# Patient Record
Sex: Female | Born: 1970
Health system: Southern US, Community
[De-identification: ages and names within clinical notes are randomized; demographics above are authoritative.]

## PROBLEM LIST (undated history)

## (undated) DIAGNOSIS — R569 Unspecified convulsions: Secondary | ICD-10-CM

## (undated) DIAGNOSIS — I639 Cerebral infarction, unspecified: Secondary | ICD-10-CM

## (undated) DIAGNOSIS — I10 Essential (primary) hypertension: Secondary | ICD-10-CM

## (undated) DIAGNOSIS — D649 Anemia, unspecified: Secondary | ICD-10-CM

## (undated) HISTORY — DX: Anemia, unspecified: D64.9

## (undated) HISTORY — DX: Cerebral infarction, unspecified: I63.9

## (undated) HISTORY — PX: KNEE ARTHROSCOPY: SUR90

## (undated) HISTORY — PX: TUBAL LIGATION: SHX77

## (undated) HISTORY — DX: Unspecified convulsions: R56.9

## (undated) HISTORY — PX: OTHER SURGICAL HISTORY: SHX169

---

## 2009-08-05 ENCOUNTER — Emergency Department (HOSPITAL_COMMUNITY): Admission: EM | Admit: 2009-08-05 | Discharge: 2009-08-05 | Payer: Self-pay | Admitting: Family Medicine

## 2011-07-04 ENCOUNTER — Encounter: Payer: Self-pay | Admitting: *Deleted

## 2011-07-04 ENCOUNTER — Emergency Department (INDEPENDENT_AMBULATORY_CARE_PROVIDER_SITE_OTHER)
Admission: EM | Admit: 2011-07-04 | Discharge: 2011-07-04 | Disposition: A | Discharge status: Home / Self Care | Payer: Self-pay | Source: Home / Self Care | Attending: Family Medicine | Admitting: Family Medicine

## 2011-07-04 DIAGNOSIS — J441 Chronic obstructive pulmonary disease with (acute) exacerbation: Secondary | ICD-10-CM

## 2011-07-04 HISTORY — DX: Essential (primary) hypertension: I10

## 2011-07-04 MED ORDER — PREDNISONE 50 MG PO TABS: 50.0000 mg | ORAL_TABLET | Freq: Every day | ORAL | Status: DC

## 2011-07-04 MED ORDER — IPRATROPIUM-ALBUTEROL 18-103 MCG/ACT IN AERO: 2.0000 | INHALATION_SPRAY | Freq: Four times a day (QID) | RESPIRATORY_TRACT | Status: DC | PRN

## 2011-07-04 MED ORDER — DOXYCYCLINE HYCLATE 50 MG PO CAPS: 50.0000 mg | ORAL_CAPSULE | Freq: Two times a day (BID) | ORAL | Status: AC

## 2011-07-04 NOTE — ED Notes (Signed)
Pt  Has  Symptoms  Of  Cough  /  Congested  Body  Aches       With  Runny  Nose  /  Low  Grade  Fever  X  2  Days     -  Pt  Appears in no  Acute  Distress  Speaking in  Complete  sentances

## 2011-07-04 NOTE — ED Provider Notes (Signed)
40 year old woman with a past history significant for smoking and use of albuterol inhalers with no diagnosis of asthma who presents with several days of cough fever congestion and nasal discharge. She denies any dyspnea and has been using her children's albuterol inhaler.  She denies any vomiting and is taking ibuprofen for her fever which does work.  Her main complaint today is a productive cough.    PMH reviewed.   Social history significant for smoking ROS as above otherwise neg Medications reviewed. No current facility-administered medications for this encounter.   Current Outpatient Prescriptions  Medication Sig Dispense Refill  . dextromethorphan (DELSYM) 30 MG/5ML liquid Take 60 mg by mouth as needed.        . hydrochlorothiazide (HYDRODIURIL) 25 MG tablet Take 25 mg by mouth daily.        Marland Kitchen ibuprofen (ADVIL,MOTRIN) 200 MG tablet Take 400 mg by mouth every 6 (six) hours as needed.        . metFORMIN (GLUCOPHAGE) 500 MG tablet Take 500 mg by mouth 2 (two) times daily with a meal.        . albuterol-ipratropium (COMBIVENT) 18-103 MCG/ACT inhaler Inhale 2 puffs into the lungs every 6 (six) hours as needed for wheezing.  14.7 g  1  . doxycycline (VIBRAMYCIN) 50 MG capsule Take 1 capsule (50 mg total) by mouth 2 (two) times daily.  14 capsule  0  . predniSONE (DELTASONE) 50 MG tablet Take 1 tablet (50 mg total) by mouth daily.  5 tablet  0    Exam:  BP 184/80  Pulse 112  Temp(Src) 100.2 F (37.9 C) (Oral)  Resp 18  SpO2 97%  LMP 06/09/2011 Gen: Well NAD, obese HEENT: EOMI,  MMM Lungs: Normal work of breathing with prolonged expiratory phase and wheezes bilaterally. No dead space  or crackles Heart: RRR no MRG Abd: NABS, NT, ND Exts: Non edematous BL  LE, warm and well perfused.   Assessment and plan: 40 year old smoker with likely COPD exacerbation.  She has no diagnosis of COPD however she's been smoking for many years and has prolonged expiratory phase and some wheeze in  association with a productive cough.  Plan to treat with prednisone, Combivent inhaler and doxycycline. I recommended she follow up with her primary care provider for further workup.  I discussed red flag signs or symptoms such as dyspnea or vomiting the patient and she expresses understanding   Clementeen Graham 07/04/11 2022

## 2012-11-14 ENCOUNTER — Emergency Department (HOSPITAL_COMMUNITY)
Admission: EM | Admit: 2012-11-14 | Discharge: 2012-11-14 | Disposition: A | Discharge status: Home / Self Care | Payer: BC Managed Care – PPO | Source: Home / Self Care

## 2012-11-14 ENCOUNTER — Encounter (HOSPITAL_COMMUNITY): Payer: Self-pay | Admitting: Emergency Medicine

## 2012-11-14 DIAGNOSIS — J45909 Unspecified asthma, uncomplicated: Secondary | ICD-10-CM

## 2012-11-14 DIAGNOSIS — S239XXA Sprain of unspecified parts of thorax, initial encounter: Secondary | ICD-10-CM

## 2012-11-14 DIAGNOSIS — S29019A Strain of muscle and tendon of unspecified wall of thorax, initial encounter: Secondary | ICD-10-CM

## 2012-11-14 DIAGNOSIS — I10 Essential (primary) hypertension: Secondary | ICD-10-CM

## 2012-11-14 LAB — POCT URINALYSIS DIP (DEVICE)
Bilirubin Urine: NEGATIVE
Ketones, ur: NEGATIVE mg/dL
Leukocytes, UA: NEGATIVE

## 2012-11-14 LAB — POCT PREGNANCY, URINE: Preg Test, Ur: NEGATIVE

## 2012-11-14 MED ORDER — PREDNISONE 20 MG PO TABS: ORAL_TABLET | ORAL | Status: DC

## 2012-11-14 MED ORDER — TRAMADOL HCL 50 MG PO TABS: 50.0000 mg | ORAL_TABLET | Freq: Four times a day (QID) | ORAL | Status: DC | PRN

## 2012-11-14 MED ORDER — ALBUTEROL SULFATE HFA 108 (90 BASE) MCG/ACT IN AERS: 2.0000 | INHALATION_SPRAY | RESPIRATORY_TRACT | Status: DC | PRN

## 2012-11-14 NOTE — ED Provider Notes (Signed)
History     CSN: [id number]  Arrival date & time 11/14/12  1724   None     Chief Complaint  Patient presents with  . Back Pain    (Consider location/radiation/quality/duration/timing/severity/associated sxs/prior treatment) HPI Comments: 42 year old female presents with complaints of right parathoracic muscle pain after cleaning the house and lifting boxes. Pain is worse with movement. Better with rest. It is located in the right parathoracic musculature just inferior to the scapula. There is no spinal pain or radiation. She is also complaining of congestion for several weeks. She has a history of reactive airway disease and has had allergy symptoms. She has been using her albuterol HFA inhaler but only sparingly.   Past Medical History  Diagnosis Date  . Diabetes mellitus   . Hypertension     Past Surgical History  Procedure Laterality Date  . Btl      No family history on file.  History  Substance Use Topics  . Smoking status: Current Every Day Smoker  . Smokeless tobacco: Not on file  . Alcohol Use: Yes    OB History   Grav Para Term Preterm Abortions TAB SAB Ect Mult Living                  Review of Systems  Constitutional: Negative.   HENT: Negative.   Respiratory: Positive for cough, shortness of breath and wheezing.   Cardiovascular: Negative for chest pain, palpitations and leg swelling.  Gastrointestinal: Negative.   Genitourinary: Negative.   Musculoskeletal: Positive for myalgias.  Skin: Negative.   Neurological: Negative.     Allergies  Review of patient's allergies indicates no known allergies.  Home Medications   Current Outpatient Rx  Name  Route  Sig  Dispense  Refill  . albuterol (PROVENTIL HFA;VENTOLIN HFA) 108 (90 BASE) MCG/ACT inhaler   Inhalation   Inhale 2 puffs into the lungs every 4 (four) hours as needed for wheezing.   1 Inhaler   0   . EXPIRED: albuterol-ipratropium (COMBIVENT) 18-103 MCG/ACT inhaler   Inhalation  Inhale 2 puffs into the lungs every 6 (six) hours as needed for wheezing.   14.7 g   1   . dextromethorphan (DELSYM) 30 MG/5ML liquid   Oral   Take 60 mg by mouth as needed.           . hydrochlorothiazide (HYDRODIURIL) 25 MG tablet   Oral   Take 25 mg by mouth daily.           Marland Kitchen ibuprofen (ADVIL,MOTRIN) 200 MG tablet   Oral   Take 400 mg by mouth every 6 (six) hours as needed.           . metFORMIN (GLUCOPHAGE) 500 MG tablet   Oral   Take 500 mg by mouth 2 (two) times daily with a meal.           . predniSONE (DELTASONE) 20 MG tablet      2 tabs po once daily x 3 days, then 1 tab daily for 4 days   10 tablet   0   . predniSONE (DELTASONE) 50 MG tablet   Oral   Take 1 tablet (50 mg total) by mouth daily.   5 tablet   0   . traMADol (ULTRAM) 50 MG tablet   Oral   Take 1 tablet (50 mg total) by mouth every 6 (six) hours as needed for pain.   15 tablet   0     BP 211/96  Pulse 112  Temp(Src) 98.7 F (37.1 C) (Oral)  Resp 16  SpO2 97%  LMP 11/07/2012  Physical Exam  Nursing note and vitals reviewed. Constitutional: She is oriented to person, place, and time. She appears well-developed and well-nourished.  Neck: Normal range of motion. Neck supple.  Cardiovascular: Normal rate, regular rhythm and normal heart sounds.   Pulmonary/Chest: Effort normal. No respiratory distress. She has wheezes.   expiratory wheezes with mildly prolonged expiratory phase. good air movement   Musculoskeletal:  Tenderness in the right parathoracic musculature just inferior to the right scapula. Reproduced with movement of the torso such as flexion and rotation. No spinal tenderness.  Lymphadenopathy:    She has no cervical adenopathy.  Neurological: She is alert and oriented to person, place, and time. She exhibits normal muscle tone.  Skin: Skin is dry.  Psychiatric: She has a normal mood and affect.    ED Course  Procedures (including critical care time)  Labs  Reviewed  POCT URINALYSIS DIP (DEVICE) - Abnormal; Notable for the following:    Glucose, UA 500 (*)    Protein, ur 30 (*)    All other components within normal limits  POCT PREGNANCY, URINE   No results found.   1. Thoracic myofascial strain, initial encounter   2. RAD (reactive airway disease) with wheezing   3. HTN (hypertension)       MDM  Tramadol 50 mg Q6 hours when necessary back pain Prednisone 40 mg daily for 3 days then 20 mg daily for 4 days Stretches as demonstrated. Albuterol HFA 2 puffs every 4-6 hours when necessary cough and wheeze Apply heat to the area of muscle that is painful Limit lifting pulling bending and other movements that exacerbates pain. Contact your primary care doctor as soon as she possibly  to have hypertension and diabetes reassessed and managed.   Hayden Rasmussen, NP 11/14/12 [phone]

## 2012-11-14 NOTE — ED Notes (Signed)
Pt c/o back pain onset yest night Recalls moving and cleaning her house when she started to feel upper right back pain Pain radiates downward; hurts to cough and increases w/movement Denies: dysuria, hematuria, f/v/n/d  She is alert and oriented w/no signs of acute distress.

## 2012-12-17 ENCOUNTER — Other Ambulatory Visit: Payer: Self-pay | Admitting: Family Medicine

## 2012-12-17 DIAGNOSIS — Z1231 Encounter for screening mammogram for malignant neoplasm of breast: Secondary | ICD-10-CM

## 2013-01-17 ENCOUNTER — Ambulatory Visit
Admission: RE | Admit: 2013-01-17 | Discharge: 2013-01-17 | Disposition: A | Discharge status: Home / Self Care | Payer: BC Managed Care – PPO | Source: Ambulatory Visit | Attending: Family Medicine | Admitting: Family Medicine

## 2013-01-17 DIAGNOSIS — Z1231 Encounter for screening mammogram for malignant neoplasm of breast: Secondary | ICD-10-CM

## 2013-08-12 ENCOUNTER — Inpatient Hospital Stay (HOSPITAL_COMMUNITY): Payer: BC Managed Care – PPO

## 2013-08-12 ENCOUNTER — Emergency Department (HOSPITAL_COMMUNITY): Payer: BC Managed Care – PPO

## 2013-08-12 ENCOUNTER — Inpatient Hospital Stay (HOSPITAL_COMMUNITY)
Admission: EM | Admit: 2013-08-12 | Discharge: 2013-08-14 | DRG: 065 | Disposition: A | Discharge status: Home / Self Care | Payer: BC Managed Care – PPO | Attending: Internal Medicine | Admitting: Internal Medicine

## 2013-08-12 ENCOUNTER — Encounter (HOSPITAL_COMMUNITY): Payer: Self-pay | Admitting: Emergency Medicine

## 2013-08-12 DIAGNOSIS — Z9851 Tubal ligation status: Secondary | ICD-10-CM

## 2013-08-12 DIAGNOSIS — E876 Hypokalemia: Secondary | ICD-10-CM | POA: Diagnosis present

## 2013-08-12 DIAGNOSIS — I6350 Cerebral infarction due to unspecified occlusion or stenosis of unspecified cerebral artery: Secondary | ICD-10-CM

## 2013-08-12 DIAGNOSIS — E1169 Type 2 diabetes mellitus with other specified complication: Secondary | ICD-10-CM

## 2013-08-12 DIAGNOSIS — IMO0002 Reserved for concepts with insufficient information to code with codable children: Secondary | ICD-10-CM | POA: Diagnosis present

## 2013-08-12 DIAGNOSIS — R209 Unspecified disturbances of skin sensation: Secondary | ICD-10-CM | POA: Diagnosis present

## 2013-08-12 DIAGNOSIS — D649 Anemia, unspecified: Secondary | ICD-10-CM | POA: Diagnosis present

## 2013-08-12 DIAGNOSIS — E119 Type 2 diabetes mellitus without complications: Secondary | ICD-10-CM

## 2013-08-12 DIAGNOSIS — E11649 Type 2 diabetes mellitus with hypoglycemia without coma: Secondary | ICD-10-CM | POA: Diagnosis present

## 2013-08-12 DIAGNOSIS — E785 Hyperlipidemia, unspecified: Secondary | ICD-10-CM | POA: Diagnosis present

## 2013-08-12 DIAGNOSIS — I6330 Cerebral infarction due to thrombosis of unspecified cerebral artery: Principal | ICD-10-CM | POA: Diagnosis present

## 2013-08-12 DIAGNOSIS — Z91199 Patient's noncompliance with other medical treatment and regimen due to unspecified reason: Secondary | ICD-10-CM

## 2013-08-12 DIAGNOSIS — I10 Essential (primary) hypertension: Secondary | ICD-10-CM | POA: Diagnosis present

## 2013-08-12 DIAGNOSIS — Z9119 Patient's noncompliance with other medical treatment and regimen: Secondary | ICD-10-CM

## 2013-08-12 DIAGNOSIS — F17200 Nicotine dependence, unspecified, uncomplicated: Secondary | ICD-10-CM | POA: Diagnosis present

## 2013-08-12 DIAGNOSIS — I639 Cerebral infarction, unspecified: Secondary | ICD-10-CM | POA: Diagnosis present

## 2013-08-12 DIAGNOSIS — Z6841 Body Mass Index (BMI) 40.0 and over, adult: Secondary | ICD-10-CM

## 2013-08-12 DIAGNOSIS — E1165 Type 2 diabetes mellitus with hyperglycemia: Secondary | ICD-10-CM | POA: Diagnosis present

## 2013-08-12 LAB — PREGNANCY, URINE: Preg Test, Ur: NEGATIVE

## 2013-08-12 LAB — URINALYSIS, ROUTINE W REFLEX MICROSCOPIC
Bilirubin Urine: NEGATIVE
HGB URINE DIPSTICK: NEGATIVE
KETONES UR: NEGATIVE mg/dL
Leukocytes, UA: NEGATIVE
Nitrite: NEGATIVE
PROTEIN: NEGATIVE mg/dL
Specific Gravity, Urine: 1.025 (ref 1.005–1.030)
UROBILINOGEN UA: 0.2 mg/dL (ref 0.0–1.0)
pH: 7 (ref 5.0–8.0)

## 2013-08-12 LAB — BASIC METABOLIC PANEL
BUN: 6 mg/dL (ref 6–23)
CALCIUM: 8.9 mg/dL (ref 8.4–10.5)
CO2: 27 meq/L (ref 19–32)
CREATININE: 0.74 mg/dL (ref 0.50–1.10)
Chloride: 96 mEq/L (ref 96–112)
GFR calc Af Amer: >90 mL/min (ref >90)
GLUCOSE: 270 mg/dL — AB (ref 70–99)
Potassium: 3.2 mEq/L — ABNORMAL LOW (ref 3.7–5.3)
Sodium: 134 mEq/L — ABNORMAL LOW (ref 137–147)

## 2013-08-12 LAB — CBC WITH DIFFERENTIAL/PLATELET
BASOS ABS: 0 10*3/uL (ref 0.0–0.1)
Basophils Relative: 0 % (ref 0–1)
EOS ABS: 0.1 10*3/uL (ref 0.0–0.7)
EOS PCT: 1 % (ref 0–5)
HEMATOCRIT: 32 % — AB (ref 36.0–46.0)
Hemoglobin: 10.2 g/dL — ABNORMAL LOW (ref 12.0–15.0)
LYMPHS ABS: 1.9 10*3/uL (ref 0.7–4.0)
LYMPHS PCT: 26 % (ref 12–46)
MCH: 23.4 pg — AB (ref 26.0–34.0)
MCHC: 31.9 g/dL (ref 30.0–36.0)
MCV: 73.6 fL — AB (ref 78.0–100.0)
MONO ABS: 0.4 10*3/uL (ref 0.1–1.0)
Monocytes Relative: 5 % (ref 3–12)
Neutro Abs: 5.2 10*3/uL (ref 1.7–7.7)
Neutrophils Relative %: 68 % (ref 43–77)
Platelets: 259 10*3/uL (ref 150–400)
RBC: 4.35 MIL/uL (ref 3.87–5.11)
RDW: 17 % — AB (ref 11.5–15.5)
WBC: 7.6 10*3/uL (ref 4.0–10.5)

## 2013-08-12 LAB — URINE MICROSCOPIC-ADD ON

## 2013-08-12 LAB — GLUCOSE, CAPILLARY: Glucose-Capillary: 142 mg/dL — ABNORMAL HIGH (ref 70–99)

## 2013-08-12 LAB — TROPONIN I: Troponin I: <0.3 ng/mL (ref <0.30)

## 2013-08-12 MED ORDER — ENOXAPARIN SODIUM 40 MG/0.4ML ~~LOC~~ SOLN
40.0000 mg | SUBCUTANEOUS | Status: DC
  Filled 2013-08-12 (×2): qty 0.4

## 2013-08-12 MED ORDER — ASPIRIN 325 MG PO TABS
325.0000 mg | ORAL_TABLET | Freq: Every day | ORAL | Status: DC
  Administered 2013-08-13 – 2013-08-14 (×2): 325 mg via ORAL
  Filled 2013-08-12 (×2): qty 1

## 2013-08-12 MED ORDER — HYDROCHLOROTHIAZIDE 12.5 MG PO CAPS
25.0000 mg | ORAL_CAPSULE | Freq: Once | ORAL | Status: AC
  Administered 2013-08-12: 25 mg via ORAL
  Filled 2013-08-12: qty 2

## 2013-08-12 MED ORDER — SENNOSIDES-DOCUSATE SODIUM 8.6-50 MG PO TABS: 1.0000 | ORAL_TABLET | Freq: Every evening | ORAL | Status: DC | PRN

## 2013-08-12 MED ORDER — SODIUM CHLORIDE 0.9 % IV SOLN
INTRAVENOUS | Status: DC
  Administered 2013-08-13: 02:00:00 via INTRAVENOUS

## 2013-08-12 MED ORDER — INSULIN ASPART 100 UNIT/ML ~~LOC~~ SOLN
0.0000 [IU] | Freq: Three times a day (TID) | SUBCUTANEOUS | Status: DC
  Administered 2013-08-13: 3 [IU] via SUBCUTANEOUS
  Administered 2013-08-13 (×2): 5 [IU] via SUBCUTANEOUS
  Administered 2013-08-14 (×2): 3 [IU] via SUBCUTANEOUS

## 2013-08-12 MED ORDER — ASPIRIN 300 MG RE SUPP
300.0000 mg | Freq: Every day | RECTAL | Status: DC
  Filled 2013-08-12 (×2): qty 1

## 2013-08-12 MED ORDER — LORAZEPAM 2 MG/ML IJ SOLN
0.5000 mg | Freq: Once | INTRAMUSCULAR | Status: AC
  Administered 2013-08-12: 0.5 mg via INTRAVENOUS
  Filled 2013-08-12: qty 1

## 2013-08-12 MED ORDER — HYDRALAZINE HCL 20 MG/ML IJ SOLN
10.0000 mg | INTRAMUSCULAR | Status: DC | PRN
  Administered 2013-08-13: 10 mg via INTRAVENOUS
  Filled 2013-08-12: qty 1

## 2013-08-12 MED ORDER — POTASSIUM CHLORIDE CRYS ER 20 MEQ PO TBCR
20.0000 meq | EXTENDED_RELEASE_TABLET | Freq: Once | ORAL | Status: AC
  Administered 2013-08-13: 20 meq via ORAL
  Filled 2013-08-12: qty 1

## 2013-08-12 MED ORDER — POTASSIUM CHLORIDE CRYS ER 20 MEQ PO TBCR
40.0000 meq | EXTENDED_RELEASE_TABLET | Freq: Once | ORAL | Status: AC
  Administered 2013-08-12: 40 meq via ORAL
  Filled 2013-08-12: qty 2

## 2013-08-12 MED ORDER — AMLODIPINE BESYLATE 5 MG PO TABS
5.0000 mg | ORAL_TABLET | Freq: Every day | ORAL | Status: DC
  Administered 2013-08-13: 5 mg via ORAL
  Filled 2013-08-12: qty 1

## 2013-08-12 MED ORDER — AMLODIPINE BESYLATE 5 MG PO TABS
5.0000 mg | ORAL_TABLET | Freq: Once | ORAL | Status: AC
  Administered 2013-08-12: 5 mg via ORAL
  Filled 2013-08-12: qty 1

## 2013-08-12 MED ORDER — ALBUTEROL SULFATE (2.5 MG/3ML) 0.083% IN NEBU: 3.0000 mL | INHALATION_SOLUTION | RESPIRATORY_TRACT | Status: DC | PRN

## 2013-08-12 NOTE — ED Notes (Addendum)
[facility] at bedside 

## 2013-08-12 NOTE — ED Notes (Signed)
Pt ambulating to restroom with steady gait

## 2013-08-12 NOTE — ED Notes (Addendum)
Pt reports symptoms began yesterday morning.

## 2013-08-12 NOTE — H&P (Addendum)
[facility] History and Physical  Cierria Haney-McDonald GNF:[id number] DOB: January 27, 1971 DOA: 08/12/2013  Referring physician: ER physician. PCP: Pcp Not In System   Chief Complaint: Right upper extremity and lower extremity numbness.  HPI: Neesha Haney-McDonald is a 43 y.o. female history of hypertension, diabetes mellitus and ongoing tobacco abuse presents to the ER because of persistent numbness of the upper and lower extremities. Patient has been having these symptoms since yesterday morning. Denies any weakness of the extremities. Denies any headache visual symptoms difficulty speaking swallowing. In the ER patient MRI brain which showed acute left thalamic infarct. On-call neurologist Dr. Roseanne RenoStewart has advised patient to be transferred [facility] for further stroke workup. Patient is in agreement with transfer. Patient denies any chest pain shortness of breath palpitations nausea vomiting abdominal pain diarrhea dizziness.   Review of Systems: As presented in the history of presenting illness, rest negative.  Past Medical History  Diagnosis Date  . Diabetes mellitus   . Hypertension    Past Surgical History  Procedure Laterality Date  . Btl    . Cesarean section    . Knee arthroscopy    . Tubal ligation     Social History:  reports that she has been smoking.  She has never used smokeless tobacco. She reports that she does not drink alcohol or use illicit drugs. Where does patient live home. Can patient participate in ADLs? Yes.  No Known Allergies  Family History:  Family History  Problem Relation Age of Onset  . Cancer Father       Prior to Admission medications   Medication Sig Start Date End Date Taking? Authorizing Provider  albuterol (PROVENTIL HFA;VENTOLIN HFA) 108 (90 BASE) MCG/ACT inhaler Inhale 2 puffs into the lungs every 4 (four) hours as needed for wheezing. 11/14/12  Yes Hayden Rasmussenavid Mabe, NP  amLODipine (NORVASC) 5 MG tablet Take 5 mg by mouth daily.   Yes  Historical Provider, MD  hydrochlorothiazide (HYDRODIURIL) 25 MG tablet Take 25 mg by mouth daily.   Yes Historical Provider, MD  ibuprofen (ADVIL,MOTRIN) 200 MG tablet Take 600 mg by mouth every 6 (six) hours as needed for cramping.    Yes Historical Provider, MD  metFORMIN (GLUCOPHAGE) 1000 MG tablet Take 1,000 mg by mouth 2 (two) times daily with a meal.   Yes Historical Provider, MD  PRESCRIPTION MEDICATION Take 1 tablet by mouth daily. Sample product from the doctor's office: Iron with probiotic.   Yes Historical Provider, MD    Physical Exam: Filed Vitals:   08/12/13 1940 08/12/13 2000 08/12/13 2015 08/12/13 2030  BP:  166/105  189/96  Pulse: 94 99 95   Temp:      TempSrc:      Resp: 19 19 19 29   SpO2: 99% 97% 99%      General:  Well-developed and nourished.  Eyes: Anicteric no pallor.  ENT: No discharge from the ears eyes nose mouth.  Neck: No mass felt.  Cardiovascular: S1-S2 heard.  Respiratory: No rhonchi or crepitations.  Abdomen: Soft nontender bowel sounds present.  Skin: No rash.  Musculoskeletal: No edema.  Psychiatric: Appears normal.  Neurologic: Alert awake oriented to time place and person. Moves all extremities 5 x 5. No facial asymmetry. No tongue deviation. PERRLA posterior.  Labs on Admission:  Basic Metabolic Panel:  Recent Labs Lab 08/12/13 1300  NA 134*  K 3.2*  CL 96  CO2 27  GLUCOSE 270*  BUN 6  CREATININE 0.74  CALCIUM 8.9  Liver Function Tests: No results found for this basename: AST, ALT, ALKPHOS, BILITOT, PROT, ALBUMIN,  in the last 168 hours No results found for this basename: LIPASE, AMYLASE,  in the last 168 hours No results found for this basename: AMMONIA,  in the last 168 hours CBC:  Recent Labs Lab 08/12/13 1300  WBC 7.6  NEUTROABS 5.2  HGB 10.2*  HCT 32.0*  MCV 73.6*  PLT 259   Cardiac Enzymes:  Recent Labs Lab 08/12/13 1300  TROPONINI <0.30    BNP (last 3 results) No results found for this  basename: PROBNP,  in the last 8760 hours CBG: No results found for this basename: GLUCAP,  in the last 168 hours  Radiological Exams on Admission: Ct Head Wo Contrast  08/12/2013   CLINICAL DATA:  Numbness  EXAM: CT HEAD WITHOUT CONTRAST  TECHNIQUE: Contiguous axial images were obtained from the base of the skull through the vertex without intravenous contrast.  COMPARISON:  None.  FINDINGS: 3.6 x 2.7 cm low-density lesion along the left frontal lobe (series 2/ image 10). Associated central calcification (series 2/image 11). Exact location of the lesion (intra-axial versus extra-axial) is unclear.  Primary differential considerations include chronic infarct (if intra-axial) or possibly an arachnoid cyst (if extra-axial). Acute/subacute infarct or solid mass are considered less likely.  No evidence of parenchymal hemorrhage or extra-axial fluid collection.  No midline shift.  Basal cisterns are patent.  Cerebral volume is within normal limits. No ventriculomegaly.  The visualized paranasal sinuses are essentially clear. The mastoid air cells are unopacified.  No evidence of calvarial fracture.  IMPRESSION: 3.6 cm low-density lesion along the left frontal lobe, as described above. Primary differential considerations include chronic infarct or arachnoid cyst. MRI brain with/without contrast is suggested for further evaluation.  These results were called by telephone at the time of interpretation on 08/12/2013 at 1:31 PM to [facility] , who verbally acknowledged these results.   Electronically Signed   By: Charline Bills M.D.   On: 08/12/2013 13:31   Mr Maxine Glenn Head Wo Contrast  08/12/2013   CLINICAL DATA:  Stroke.  Numbness.  Abnormal CT  EXAM: MRI HEAD WITHOUT CONTRAST  MRA HEAD WITHOUT CONTRAST  TECHNIQUE: Multiplanar, multiecho pulse sequences of the brain and surrounding structures were obtained without intravenous contrast. Angiographic images of the head were obtained using MRA technique without  contrast.  COMPARISON:  CT 08/12/2013  FINDINGS: MRI HEAD FINDINGS  Acute infarct left thalamus measuring approximately 13 mm in diameter. No other areas of acute infarct.  Encephalomalacia left frontal temporal lobe compatible with chronic infarct, as noted on the CT.  Chronic microvascular ischemic change throughout the white matter. Hyperintensity right lateral cerebellum most likely is an area of chronic infarct. This area does not show any restricted diffusion.  MRA HEAD FINDINGS  Atherosclerotic disease in both vertebral arteries which are patent to the basilar. The basilar is patent with mild atherosclerotic disease. Superior cerebellar and posterior cerebral arteries are patent with moderate atherosclerotic disease diffusely.  Cavernous carotid is irregular with mild to moderate stenosis bilaterally. Both anterior cerebral arteries are patent. There is a moderate to severe stenosis in the left A1 segment.  The right middle cerebral artery is patent with mild atherosclerotic disease. Mild atherosclerotic disease in the left middle cerebral artery.  Negative for cerebral aneurysm.  IMPRESSION: Acute infarct left thalamus  Chronic infarct left frontal temporal lobe and throughout the cerebral white matter. Hyperintensity right lateral cerebellum appears to represent a chronic  infarct.  Diffuse intracranial atherosclerotic disease without large vessel occlusion.  I discussed the findings by telephone with Polly Cobia PA   Electronically Signed   By: Marlan Palau M.D.   On: 08/12/2013 19:02   Mr Brain Wo Contrast  08/12/2013   CLINICAL DATA:  Stroke.  Numbness.  Abnormal CT  EXAM: MRI HEAD WITHOUT CONTRAST  MRA HEAD WITHOUT CONTRAST  TECHNIQUE: Multiplanar, multiecho pulse sequences of the brain and surrounding structures were obtained without intravenous contrast. Angiographic images of the head were obtained using MRA technique without contrast.  COMPARISON:  CT 08/12/2013  FINDINGS: MRI HEAD FINDINGS   Acute infarct left thalamus measuring approximately 13 mm in diameter. No other areas of acute infarct.  Encephalomalacia left frontal temporal lobe compatible with chronic infarct, as noted on the CT.  Chronic microvascular ischemic change throughout the white matter. Hyperintensity right lateral cerebellum most likely is an area of chronic infarct. This area does not show any restricted diffusion.  MRA HEAD FINDINGS  Atherosclerotic disease in both vertebral arteries which are patent to the basilar. The basilar is patent with mild atherosclerotic disease. Superior cerebellar and posterior cerebral arteries are patent with moderate atherosclerotic disease diffusely.  Cavernous carotid is irregular with mild to moderate stenosis bilaterally. Both anterior cerebral arteries are patent. There is a moderate to severe stenosis in the left A1 segment.  The right middle cerebral artery is patent with mild atherosclerotic disease. Mild atherosclerotic disease in the left middle cerebral artery.  Negative for cerebral aneurysm.  IMPRESSION: Acute infarct left thalamus  Chronic infarct left frontal temporal lobe and throughout the cerebral white matter. Hyperintensity right lateral cerebellum appears to represent a chronic infarct.  Diffuse intracranial atherosclerotic disease without large vessel occlusion.  I discussed the findings by telephone with Polly Cobia PA   Electronically Signed   By: Marlan Palau M.D.   On: 08/12/2013 19:02    EKG: Independently reviewed. Normal sinus rhythm.  Assessment/Plan Principal Problem:   CVA (cerebral infarction) Active Problems:   HTN (hypertension)   Diabetes mellitus   1. Acute left thalamic infarct - patient has been placed on neurochecks, swallow evaluation. I have ordered 2-D echo carotid Dopplers hemoglobin A1c lipid panel and physical therapy consult. Patient will be monitored on telemetry. Patient to be seen by neurologist on arrival at [facility].  Patient is agreeable to transfer to [facility] for further workup. Aspirin. 2. Diabetes mellitus type 2 uncontrolled - closely follow CBGs with sliding-scale coverage. Check hemoglobin A1c. 3. Hypertension uncontrolled - at this time due to acute stroke we are allowing permissive hypertension. I have placed patient on when necessary IV hydralazine for systolic blood pressure more than 220 and diastolic more 120. Closely follow blood pressure trends. I'm holding off patient's hydrochlorothiazide for now and gently hydrating because of stroke. 4. Anemia - check anemia panel. Closely follow CBC. 5. Tobacco abuse - patient advised to quit smoking. 6. Mild hypokalemia - replace and recheck.  Patient is being transferred to [facility] for further stroke workup. Patient is agreeable to transfer. Accepting physician is Dr. Allena Katz.  Code Status: Full code.  Family Communication: None.  Disposition Plan: Admit to inpatient.    Javoris Star N. [facility] Pager [phone].  If 7PM-7AM, please contact night-coverage [email or web address] Password [facility] 08/12/2013, 8:49 PM

## 2013-08-12 NOTE — ED Provider Notes (Signed)
Complains of numbness at right arm and right leg onset upon awakening yesterday morning. She does admit to noncompliance with blood pressure medications No difficulty with strength or coordination no difficulty with speaking numbness is constant. On exam patient is alert Glasgow Coma Score HEENT exam no facial asymmetry neck supple no bruit lungs clear auscultation heart regular rate and rhythm abdomen morbidly obese nontender all 4 extremities no redness or tenderness neurovascularly intact neurologic Glasgow Coma Score 15 gait normal Romberg normal pronator drift normal cranial gross 12 grossly intact finger to nose normal motor strength 5 over 5 overall  Doug SouSam Zaccary Creech, MD 08/12/13 1612

## 2013-08-12 NOTE — ED Notes (Signed)
MD [place] at bedside. 

## 2013-08-12 NOTE — ED Notes (Signed)
[facility] called ETA "at least one hour".

## 2013-08-12 NOTE — ED Notes (Signed)
Bed: WA01 Expected date:  Expected time:  Means of arrival:  Comments: ems- 43 yo F, right arm and leg numbness from neck down x1 day

## 2013-08-12 NOTE — ED Notes (Signed)
Per EMS, pt went to MD today c/o rt neck arm and leg numbness.  MD sent pt emergent to ED with increased b/p and c/o numbness.  Pt hx DM, HTN, Vitals:  100, 190/112, resp 16,  cbg 278.  sats 99%

## 2013-08-12 NOTE — ED Notes (Signed)
Patient talking on cell phone

## 2013-08-12 NOTE — ED Notes (Signed)
Pt in MRI when this RN was rounding will check back.

## 2013-08-12 NOTE — ED Provider Notes (Signed)
Medications  amLODipine (NORVASC) tablet 5 mg (5 mg Oral Given 08/12/13 1341)  hydrochlorothiazide (MICROZIDE) capsule 25 mg (25 mg Oral Given 08/12/13 1341)  LORazepam (ATIVAN) injection 0.5 mg (0.5 mg Intravenous Given 08/12/13 1509)   Results for orders placed during the hospital encounter of 08/12/13  CBC WITH DIFFERENTIAL      Result Value Range   WBC 7.6  4.0 - 10.5 K/uL   RBC 4.35  3.87 - 5.11 MIL/uL   Hemoglobin 10.2 (*) 12.0 - 15.0 g/dL   HCT 16.1 (*) 09.6 - 04.5 %   MCV 73.6 (*) 78.0 - 100.0 fL   MCH 23.4 (*) 26.0 - 34.0 pg   MCHC 31.9  30.0 - 36.0 g/dL   RDW 40.9 (*) 81.1 - 91.4 %   Platelets 259  150 - 400 K/uL   Neutrophils Relative % 68  43 - 77 %   Neutro Abs 5.2  1.7 - 7.7 K/uL   Lymphocytes Relative 26  12 - 46 %   Lymphs Abs 1.9  0.7 - 4.0 K/uL   Monocytes Relative 5  3 - 12 %   Monocytes Absolute 0.4  0.1 - 1.0 K/uL   Eosinophils Relative 1  0 - 5 %   Eosinophils Absolute 0.1  0.0 - 0.7 K/uL   Basophils Relative 0  0 - 1 %   Basophils Absolute 0.0  0.0 - 0.1 K/uL  BASIC METABOLIC PANEL      Result Value Range   Sodium 134 (*) 137 - 147 mEq/L   Potassium 3.2 (*) 3.7 - 5.3 mEq/L   Chloride 96  96 - 112 mEq/L   CO2 27  19 - 32 mEq/L   Glucose, Bld 270 (*) 70 - 99 mg/dL   BUN 6  6 - 23 mg/dL   Creatinine, Ser 7.82  0.50 - 1.10 mg/dL   Calcium 8.9  8.4 - 95.6 mg/dL   GFR calc non Af Amer >90  >90 mL/min   GFR calc Af Amer >90  >90 mL/min  URINALYSIS, ROUTINE W REFLEX MICROSCOPIC      Result Value Range   Color, Urine YELLOW  YELLOW   APPearance CLEAR  CLEAR   Specific Gravity, Urine 1.025  1.005 - 1.030   pH 7.0  5.0 - 8.0   Glucose, UA >1000 (*) NEGATIVE mg/dL   Hgb urine dipstick NEGATIVE  NEGATIVE   Bilirubin Urine NEGATIVE  NEGATIVE   Ketones, ur NEGATIVE  NEGATIVE mg/dL   Protein, ur NEGATIVE  NEGATIVE mg/dL   Urobilinogen, UA 0.2  0.0 - 1.0 mg/dL   Nitrite NEGATIVE  NEGATIVE   Leukocytes, UA NEGATIVE  NEGATIVE  TROPONIN I      Result Value Range    Troponin I <0.30  <0.30 ng/mL  PREGNANCY, URINE      Result Value Range   Preg Test, Ur NEGATIVE  NEGATIVE  URINE MICROSCOPIC-ADD ON      Result Value Range   Squamous Epithelial / LPF FEW (*) RARE   WBC, UA 0-2  <3 WBC/hpf   Urine-Other MUCOUS PRESENT     Ct Head Wo Contrast  08/12/2013   CLINICAL DATA:  Numbness  EXAM: CT HEAD WITHOUT CONTRAST  TECHNIQUE: Contiguous axial images were obtained from the base of the skull through the vertex without intravenous contrast.  COMPARISON:  None.  FINDINGS: 3.6 x 2.7 cm low-density lesion along the left frontal lobe (series 2/ image 10). Associated central calcification (series 2/image 11). Exact location  of the lesion (intra-axial versus extra-axial) is unclear.  Primary differential considerations include chronic infarct (if intra-axial) or possibly an arachnoid cyst (if extra-axial). Acute/subacute infarct or solid mass are considered less likely.  No evidence of parenchymal hemorrhage or extra-axial fluid collection.  No midline shift.  Basal cisterns are patent.  Cerebral volume is within normal limits. No ventriculomegaly.  The visualized paranasal sinuses are essentially clear. The mastoid air cells are unopacified.  No evidence of calvarial fracture.  IMPRESSION: 3.6 cm low-density lesion along the left frontal lobe, as described above. Primary differential considerations include chronic infarct or arachnoid cyst. MRI brain with/without contrast is suggested for further evaluation.  These results were called by telephone at the time of interpretation on 08/12/2013 at 1:31 PM to [facility]ESS[facility] PALMER , who verbally acknowledged these results.   Electronically Signed   By: Charline BillsSriyesh  Krishnan M.D.   On: 08/12/2013 13:31    [place] results. MR results called with acute left thalamus infarct. Neurohospitalist consulted and [facility]. Patient is out of the window of time for  therapeutic thrombolytics. Reviewed results with patient. Patient continues with right arm numbness on re-assessment. The patient appears reasonably stabilized for admission considering the current resources, flow, and capabilities available in the ED at this time, and I doubt any other [facility] further screening and/or treatment in the ED prior to admission.Patient d/w with Dr. Criss AlvineGoldston, agrees with plan.     Jeannetta EllisJennifer L Lella Mullany, PA-C 08/12/13 2331

## 2013-08-12 NOTE — Consult Note (Signed)
Referring Physician: Dr. Rennis ChrisJacobowitz    Chief Complaint: New-onset left-sided numbness.  HPI: Michele [name] is an 43 y.o. female with a history of hypertension and diabetes mellitus who woke up with numbness and tingling involving her left side on the morning of 08/11/2013. She was last known well at about 12:30 AM earlier that morning when she went to bed. She has no documented history of previous stroke or TIA. She has not been on antiplatelet therapy. CT scan of the head showed acute left thalamic infarction, as well as an old left frontotemporal infarction as well as possible old right cerebellar stroke. NIH stroke score was 1.  LSN: 30 a.m. on 08/11/2013 tPA Given: No: Beyond time under for treatment consideration MRankin: 0  Past Medical History  Diagnosis Date  . Diabetes mellitus   . Hypertension     Family History  Problem Relation Age of Onset  . Cancer Father      Medications: I have reviewed the patient's current medications.  ROS: History obtained from the patient  General ROS: negative for - chills, fatigue, fever, night sweats, weight gain or weight loss Psychological ROS: negative for - behavioral disorder, hallucinations, memory difficulties, mood swings or suicidal ideation Ophthalmic ROS: negative for - blurry vision, double vision, eye pain or loss of vision ENT ROS: negative for - epistaxis, nasal discharge, oral lesions, sore throat, tinnitus or vertigo Allergy and Immunology ROS: negative for - hives or itchy/watery eyes Hematological and Lymphatic ROS: negative for - bleeding problems, bruising or swollen lymph nodes Endocrine ROS: negative for - galactorrhea, hair pattern changes, polydipsia/polyuria or temperature intolerance Respiratory ROS: negative for - cough, hemoptysis, shortness of breath or wheezing Cardiovascular ROS: negative for - chest pain, dyspnea on exertion, edema or irregular heartbeat Gastrointestinal ROS: negative for -  abdominal pain, diarrhea, hematemesis, nausea/vomiting or stool incontinence Genito-Urinary ROS: negative for - dysuria, hematuria, incontinence or urinary frequency/urgency Musculoskeletal ROS: negative for - joint swelling or muscular weakness Neurological ROS: as noted in HPI Dermatological ROS: negative for rash and skin lesion changes  Physical Examination: Blood pressure 203/103, pulse 95, temperature 98.2 F (36.8 C), temperature source Oral, resp. rate 12, last menstrual period 07/22/2013, SpO2 99.00%.  Neurologic Examination: Mental Status: Alert, oriented, thought content appropriate.  Speech fluent without evidence of aphasia. Able to follow commands without difficulty. Cranial Nerves: II-Visual fields were normal. III/IV/VI-Pupils were equal and reacted. Extraocular movements were full and conjugate.    V/VII-slight right facial numbness; no facial weakness. VIII-normal. X-normal speech and symmetrical palatal movement. Motor: 5/5 bilaterally with normal tone and bulk Sensory: Slightly reduced perception of tactile sensation with paresthesias involving right upper extremity; normal sensory findings otherwise.Marland Kitchen. Deep Tendon Reflexes: 1+ and symmetric in upper extremities and absent at knees and ankles. Plantars: Flexor bilaterally Cerebellar: Normal finger-to-nose testing. Carotid auscultation: Normal  Ct Head Wo Contrast  08/12/2013   CLINICAL DATA:  Numbness  EXAM: CT HEAD WITHOUT CONTRAST  TECHNIQUE: Contiguous axial images were obtained from the base of the skull through the vertex without intravenous contrast.  COMPARISON:  None.  FINDINGS: 3.6 x 2.7 cm low-density lesion along the left frontal lobe (series 2/ image 10). Associated central calcification (series 2/image 11). Exact location of the lesion (intra-axial versus extra-axial) is unclear.  Primary differential considerations include chronic infarct (if intra-axial) or possibly an arachnoid cyst (if extra-axial).  Acute/subacute infarct or solid mass are considered less likely.  No evidence of parenchymal hemorrhage or extra-axial fluid collection.  No midline shift.  Basal cisterns are patent.  Cerebral volume is within normal limits. No ventriculomegaly.  The visualized paranasal sinuses are essentially clear. The mastoid air cells are unopacified.  No evidence of calvarial fracture.  IMPRESSION: 3.6 cm low-density lesion along the left frontal lobe, as described above. Primary differential considerations include chronic infarct or arachnoid cyst. MRI brain with/without contrast is suggested for further evaluation.  These results were called by telephone at the time of interpretation on 08/12/2013 at 1:31 PM to [facility] , who verbally acknowledged these results.   Electronically Signed   By: Charline Bills M.D.   On: 08/12/2013 13:31   Mr Maxine Glenn Head Wo Contrast  08/12/2013   CLINICAL DATA:  Stroke.  Numbness.  Abnormal CT  EXAM: MRI HEAD WITHOUT CONTRAST  MRA HEAD WITHOUT CONTRAST  TECHNIQUE: Multiplanar, multiecho pulse sequences of the brain and surrounding structures were obtained without intravenous contrast. Angiographic images of the head were obtained using MRA technique without contrast.  COMPARISON:  CT 08/12/2013  FINDINGS: MRI HEAD FINDINGS  Acute infarct left thalamus measuring approximately 13 mm in diameter. No other areas of acute infarct.  Encephalomalacia left frontal temporal lobe compatible with chronic infarct, as noted on the CT.  Chronic microvascular ischemic change throughout the white matter. Hyperintensity right lateral cerebellum most likely is an area of chronic infarct. This area does not show any restricted diffusion.  MRA HEAD FINDINGS  Atherosclerotic disease in both vertebral arteries which are patent to the basilar. The basilar is patent with mild atherosclerotic disease. Superior cerebellar and posterior cerebral arteries are patent with moderate atherosclerotic disease diffusely.   Cavernous carotid is irregular with mild to moderate stenosis bilaterally. Both anterior cerebral arteries are patent. There is a moderate to severe stenosis in the left A1 segment.  The right middle cerebral artery is patent with mild atherosclerotic disease. Mild atherosclerotic disease in the left middle cerebral artery.  Negative for cerebral aneurysm.  IMPRESSION: Acute infarct left thalamus  Chronic infarct left frontal temporal lobe and throughout the cerebral white matter. Hyperintensity right lateral cerebellum appears to represent a chronic infarct.  Diffuse intracranial atherosclerotic disease without large vessel occlusion.  I discussed the findings by telephone with Polly Cobia PA   Electronically Signed   By: Marlan Palau M.D.   On: 08/12/2013 19:02   Mr Brain Wo Contrast  08/12/2013   CLINICAL DATA:  Stroke.  Numbness.  Abnormal CT  EXAM: MRI HEAD WITHOUT CONTRAST  MRA HEAD WITHOUT CONTRAST  TECHNIQUE: Multiplanar, multiecho pulse sequences of the brain and surrounding structures were obtained without intravenous contrast. Angiographic images of the head were obtained using MRA technique without contrast.  COMPARISON:  CT 08/12/2013  FINDINGS: MRI HEAD FINDINGS  Acute infarct left thalamus measuring approximately 13 mm in diameter. No other areas of acute infarct.  Encephalomalacia left frontal temporal lobe compatible with chronic infarct, as noted on the CT.  Chronic microvascular ischemic change throughout the white matter. Hyperintensity right lateral cerebellum most likely is an area of chronic infarct. This area does not show any restricted diffusion.  MRA HEAD FINDINGS  Atherosclerotic disease in both vertebral arteries which are patent to the basilar. The basilar is patent with mild atherosclerotic disease. Superior cerebellar and posterior cerebral arteries are patent with moderate atherosclerotic disease diffusely.  Cavernous carotid is irregular with mild to moderate stenosis  bilaterally. Both anterior cerebral arteries are patent. There is a moderate to severe stenosis in the left A1 segment.  The right middle cerebral  artery is patent with mild atherosclerotic disease. Mild atherosclerotic disease in the left middle cerebral artery.  Negative for cerebral aneurysm.  IMPRESSION: Acute infarct left thalamus  Chronic infarct left frontal temporal lobe and throughout the cerebral white matter. Hyperintensity right lateral cerebellum appears to represent a chronic infarct.  Diffuse intracranial atherosclerotic disease without large vessel occlusion.  I discussed the findings by telephone with Polly Cobia PA   Electronically Signed   By: Marlan Palau M.D.   On: 08/12/2013 19:02    Assessment: 43 y.o. female with significant risk factors for stroke presenting with acute left thalamic ischemic infarction.  Stroke Risk Factors - diabetes mellitus, family history and hypertension  Plan: 1. HgbA1c, fasting lipid panel 2. Telemetry monitoring 3. PT consult, OT consult, Speech consult 4. Echocardiogram 5. Carotid dopplers 6. Prophylactic therapy-Antiplatelet med: Aspirin  7. Risk factor modification    C.R. Roseanne Reno, MD [facility] [phone]  08/12/2013, 10:48 PM

## 2013-08-12 NOTE — ED Notes (Signed)
Pt not taking HCTZ at this time.  States that it causes increased urination.  Pt also states not taking amlodipine every day b/c of how it makes her feel.

## 2013-08-12 NOTE — ED Provider Notes (Signed)
CSN: [id number]     Arrival date & time 08/12/13  1201 History   First MD Initiated Contact with Patient 08/12/13 1212     Chief Complaint  Patient presents with  . Numbness    HPI  Michele [name] is a 43 y.o. female with a PMH of DM and HTN who presents to the ED for evaluation of numbness.  History was provided by the patient.  Patient states that she woke up yesterday morning around 9:00 AM with right sided numbness.  She denies any loss of sensation or weakness.  She denies any pain but states that she has constant numbness in her right arm from her right neck down to her fingers and right leg to her toes.  She denies any slurred speech, ataxia, confusion, or facial drooping. She denies any headache, dizziness, lightheadedness, chest pain, shortness of breath, abdominal pain, nausea, vomiting, diarrhea, constipation, dysuria. She states she "had a great weekend" and was doing well until yesterday morning. She states that she has not been taking her hydrochlorothiazide (25 mg daily) for over a month because of increased urination. She states that she takes her amlodipine sporadically (dose?) because "I don't like the way it makes me feel."  She went to her PCP at [facility] today (Dr. Earnest Bailey) to evaluate her arm numbness and was sent to the ED for further evaluation. No fevers. Patient is on metformin for her DM. She does not regularly check her BS.     Past Medical History  Diagnosis Date  . Diabetes mellitus   . Hypertension    Past Surgical History  Procedure Laterality Date  . Btl    . Cesarean section    . Knee arthroscopy    . Tubal ligation     History reviewed. No pertinent family history. History  Substance Use Topics  . Smoking status: Current Every Day Smoker  . Smokeless tobacco: Not on file  . Alcohol Use: Yes   OB History   Grav Para Term Preterm Abortions TAB SAB Ect Mult Living                 Review of Systems  Constitutional: Negative for fever,  chills, diaphoresis, activity change, appetite change and fatigue.  HENT: Negative for congestion, rhinorrhea and sore throat.   Eyes: Negative for photophobia and visual disturbance.  Respiratory: Negative for cough and shortness of breath.   Cardiovascular: Negative for chest pain and leg swelling.  Gastrointestinal: Negative for nausea, vomiting, abdominal pain, diarrhea and constipation.  Genitourinary: Negative for dysuria.  Musculoskeletal: Negative for back pain, gait problem, myalgias, neck pain and neck stiffness.  Skin: Negative for color change and wound.  Neurological: Positive for numbness. Negative for dizziness, syncope, weakness, light-headedness and headaches.    Allergies  Review of patient's allergies indicates no known allergies.  Home Medications   Current Outpatient Rx  Name  Route  Sig  Dispense  Refill  . albuterol (PROVENTIL HFA;VENTOLIN HFA) 108 (90 BASE) MCG/ACT inhaler   Inhalation   Inhale 2 puffs into the lungs every 4 (four) hours as needed for wheezing.   1 Inhaler   0   . EXPIRED: albuterol-ipratropium (COMBIVENT) 18-103 MCG/ACT inhaler   Inhalation   Inhale 2 puffs into the lungs every 6 (six) hours as needed for wheezing.   14.7 g   1   . dextromethorphan (DELSYM) 30 MG/5ML liquid   Oral   Take 60 mg by mouth as needed.           Marland Kitchen  hydrochlorothiazide (HYDRODIURIL) 25 MG tablet   Oral   Take 25 mg by mouth daily.           Marland Kitchen ibuprofen (ADVIL,MOTRIN) 200 MG tablet   Oral   Take 400 mg by mouth every 6 (six) hours as needed.           . metFORMIN (GLUCOPHAGE) 500 MG tablet   Oral   Take 500 mg by mouth 2 (two) times daily with a meal.           . predniSONE (DELTASONE) 20 MG tablet      2 tabs po once daily x 3 days, then 1 q d for 4 days   10 tablet   0   . predniSONE (DELTASONE) 50 MG tablet   Oral   Take 1 tablet (50 mg total) by mouth daily.   5 tablet   0    BP 216/103  Pulse 94  Temp(Src) 97.9 F (36.6 C)  (Oral)  Resp 18  SpO2 97%  LMP 07/22/2013  Filed Vitals:   08/12/13 1211 08/12/13 1215 08/12/13 1454 08/12/13 1624  BP: 194/101 216/103 213/102 208/96  Pulse: 94  92 100  Temp: 97.9 F (36.6 C)  98.2 F (36.8 C)   TempSrc: Oral  Oral   Resp: 18  12 18   SpO2: 97%  100% 99%    Physical Exam  Nursing note and vitals reviewed. Constitutional: She is oriented to person, place, and time. She appears well-developed and well-nourished. No distress.  HENT:  Head: Normocephalic and atraumatic.  Right Ear: External ear normal.  Left Ear: External ear normal.  Nose: Nose normal.  Mouth/Throat: Oropharynx is clear and moist. No oropharyngeal exudate.  No tenderness to the scalp or face throughout. No palpable hematoma, step-offs, or lacerations throughout.   Eyes: Conjunctivae and EOM are normal. Pupils are equal, round, and reactive to light. Right eye exhibits no discharge. Left eye exhibits no discharge.  No papilledema bilaterally  Neck: Normal range of motion. Neck supple.  No cervical spinal or paraspinal tenderness to palpation throughout.  No limitations with neck ROM.    Cardiovascular: Normal rate, regular rhythm, normal heart sounds and intact distal pulses.  Exam reveals no gallop and no friction rub.   No murmur heard. Dorsalis pedis pulses present and equal bilaterally  Pulmonary/Chest: Effort normal and breath sounds normal. No respiratory distress. She has no wheezes. She has no rales. She exhibits no tenderness.  Abdominal: Soft. Bowel sounds are normal. She exhibits no distension and no mass. There is no tenderness. There is no rebound and no guarding.  Musculoskeletal: Normal range of motion. She exhibits no edema and no tenderness.  Strength 5/5 in the upper and lower extremities bilaterally. No pedal edema or calf tenderness bilaterally   Neurological: She is alert and oriented to person, place, and time.  GCS 15.  No focal neurological deficits.  CN 2-12 intact.  No  pronator drift.  Finger to nose intact.  Heel to shin intact.  Gross sensation intact in the UE and LE  Skin: Skin is warm and dry. She is not diaphoretic.    ED Course  Procedures (including critical care time) Labs Review Labs Reviewed - No data to display Imaging Review No results found.  EKG Interpretation   None       Date: 08/12/2013  Rate: 87  Rhythm: normal sinus rhythm  QRS Axis: left  Intervals: normal  ST/T Wave abnormalities: normal  Conduction Disutrbances:none  Narrative Interpretation:   Old EKG Reviewed: none available   Results for orders placed during the hospital encounter of 08/12/13  CBC WITH DIFFERENTIAL      Result Value Range   WBC 7.6  4.0 - 10.5 K/uL   RBC 4.35  3.87 - 5.11 MIL/uL   Hemoglobin 10.2 (*) 12.0 - 15.0 g/dL   HCT 16.132.0 (*) 09.636.0 - 04.546.0 %   MCV 73.6 (*) 78.0 - 100.0 fL   MCH 23.4 (*) 26.0 - 34.0 pg   MCHC 31.9  30.0 - 36.0 g/dL   RDW 40.917.0 (*) 81.111.5 - 91.415.5 %   Platelets 259  150 - 400 K/uL   Neutrophils Relative % 68  43 - 77 %   Neutro Abs 5.2  1.7 - 7.7 K/uL   Lymphocytes Relative 26  12 - 46 %   Lymphs Abs 1.9  0.7 - 4.0 K/uL   Monocytes Relative 5  3 - 12 %   Monocytes Absolute 0.4  0.1 - 1.0 K/uL   Eosinophils Relative 1  0 - 5 %   Eosinophils Absolute 0.1  0.0 - 0.7 K/uL   Basophils Relative 0  0 - 1 %   Basophils Absolute 0.0  0.0 - 0.1 K/uL  BASIC METABOLIC PANEL      Result Value Range   Sodium 134 (*) 137 - 147 mEq/L   Potassium 3.2 (*) 3.7 - 5.3 mEq/L   Chloride 96  96 - 112 mEq/L   CO2 27  19 - 32 mEq/L   Glucose, Bld 270 (*) 70 - 99 mg/dL   BUN 6  6 - 23 mg/dL   Creatinine, Ser 7.820.74  0.50 - 1.10 mg/dL   Calcium 8.9  8.4 - 95.610.5 mg/dL   GFR calc non Af Amer >90  >90 mL/min   GFR calc Af Amer >90  >90 mL/min  URINALYSIS, ROUTINE W REFLEX MICROSCOPIC      Result Value Range   Color, Urine YELLOW  YELLOW   APPearance CLEAR  CLEAR   Specific Gravity, Urine 1.025  1.005 - 1.030   pH 7.0  5.0 - 8.0   Glucose, UA  >1000 (*) NEGATIVE mg/dL   Hgb urine dipstick NEGATIVE  NEGATIVE   Bilirubin Urine NEGATIVE  NEGATIVE   Ketones, ur NEGATIVE  NEGATIVE mg/dL   Protein, ur NEGATIVE  NEGATIVE mg/dL   Urobilinogen, UA 0.2  0.0 - 1.0 mg/dL   Nitrite NEGATIVE  NEGATIVE   Leukocytes, UA NEGATIVE  NEGATIVE  TROPONIN I      Result Value Range   Troponin I <0.30  <0.30 ng/mL  PREGNANCY, URINE      Result Value Range   Preg Test, Ur NEGATIVE  NEGATIVE  URINE MICROSCOPIC-ADD ON      Result Value Range   Squamous Epithelial / LPF FEW (*) RARE   WBC, UA 0-2  <3 WBC/hpf   Urine-Other MUCOUS PRESENT         CT Head Wo Contrast (Final result)  Result time: 08/12/13 13:31:23    Final result by Rad Results In Interface (08/12/13 13:31:23)    Narrative:   CLINICAL DATA: Numbness  EXAM: CT HEAD WITHOUT CONTRAST  TECHNIQUE: Contiguous axial images were obtained from the base of the skull through the vertex without intravenous contrast.  COMPARISON: None.  FINDINGS: 3.6 x 2.7 cm low-density lesion along the left frontal lobe (series 2/ image 10). Associated central calcification (series 2/image 11). Exact location of the lesion (intra-axial versus  extra-axial) is unclear.  Primary differential considerations include chronic infarct (if intra-axial) or possibly an arachnoid cyst (if extra-axial). Acute/subacute infarct or solid mass are considered less likely.  No evidence of parenchymal hemorrhage or extra-axial fluid collection.  No midline shift. Basal cisterns are patent.  Cerebral volume is within normal limits. No ventriculomegaly.  The visualized paranasal sinuses are essentially clear. The mastoid air cells are unopacified.  No evidence of calvarial fracture.  IMPRESSION: 3.6 cm low-density lesion along the left frontal lobe, as described above. Primary differential considerations include chronic infarct or arachnoid cyst. MRI brain with/without contrast is suggested for further  evaluation.  These results were called by telephone at the time of interpretation on 08/12/2013 at 1:31 PM to [facility] , who verbally acknowledged these results.   Electronically Signed By: Charline Bills M.D. On: 08/12/2013 13:31         MDM   Michele [name] is a 43 y.o. female with a PMH of DM and HTN who presents to the ED for evaluation of right sided numbness.  Patient's head CT showed a low density lesion in the frontal lobe.  MRI recommended and is pending.  Patient has no complaints of pain and had no neurological deficits on exam. Patient's elevated BP likely due to non-compliance with medications.  Patient given home dose of HCTZ and amlodipine and will continue to monitor.  Patient otherwise has stable anemia, mild hypokalemia (3.2 - given K-dur in the ED), and mild hypoglycemia (270).  She had no complaints of chest pain.  Troponin negative.  EKG negative for any acute ischemic changes. Await results of MRI for disposition.      4:35 PM = Signed out care to [facility].  Await results of MRA.  Continue to monitor BP.     Luiz Iron PA-C   This patient was discussed with Dr. Ethelda Chick.     Jillyn Ledger, PA-C 08/12/13 2154

## 2013-08-13 DIAGNOSIS — E785 Hyperlipidemia, unspecified: Secondary | ICD-10-CM

## 2013-08-13 DIAGNOSIS — I517 Cardiomegaly: Secondary | ICD-10-CM

## 2013-08-13 LAB — CBC WITH DIFFERENTIAL/PLATELET
BASOS ABS: 0 10*3/uL (ref 0.0–0.1)
Basophils Relative: 0 % (ref 0–1)
Eosinophils Absolute: 0.1 10*3/uL (ref 0.0–0.7)
Eosinophils Relative: 1 % (ref 0–5)
HEMATOCRIT: 31.3 % — AB (ref 36.0–46.0)
Hemoglobin: 10.2 g/dL — ABNORMAL LOW (ref 12.0–15.0)
LYMPHS ABS: 2 10*3/uL (ref 0.7–4.0)
LYMPHS PCT: 26 % (ref 12–46)
MCH: 23.7 pg — ABNORMAL LOW (ref 26.0–34.0)
MCHC: 32.6 g/dL (ref 30.0–36.0)
MCV: 72.6 fL — ABNORMAL LOW (ref 78.0–100.0)
MONO ABS: 0.4 10*3/uL (ref 0.1–1.0)
Monocytes Relative: 5 % (ref 3–12)
NEUTROS ABS: 5.2 10*3/uL (ref 1.7–7.7)
Neutrophils Relative %: 68 % (ref 43–77)
Platelets: 267 10*3/uL (ref 150–400)
RBC: 4.31 MIL/uL (ref 3.87–5.11)
RDW: 16.9 % — ABNORMAL HIGH (ref 11.5–15.5)
WBC: 7.6 10*3/uL (ref 4.0–10.5)

## 2013-08-13 LAB — VITAMIN B12: VITAMIN B 12: 636 pg/mL (ref 211–911)

## 2013-08-13 LAB — COMPREHENSIVE METABOLIC PANEL
ALBUMIN: 3.2 g/dL — AB (ref 3.5–5.2)
ALK PHOS: 60 U/L (ref 39–117)
ALT: 17 U/L (ref 0–35)
AST: 14 U/L (ref 0–37)
BUN: 8 mg/dL (ref 6–23)
CALCIUM: 8.8 mg/dL (ref 8.4–10.5)
CO2: 23 mEq/L (ref 19–32)
CREATININE: 0.81 mg/dL (ref 0.50–1.10)
Chloride: 98 mEq/L (ref 96–112)
GFR calc non Af Amer: 88 mL/min — ABNORMAL LOW (ref >90)
GLUCOSE: 274 mg/dL — AB (ref 70–99)
POTASSIUM: 3.1 meq/L — AB (ref 3.7–5.3)
Sodium: 136 mEq/L — ABNORMAL LOW (ref 137–147)
TOTAL PROTEIN: 7.5 g/dL (ref 6.0–8.3)
Total Bilirubin: 0.5 mg/dL (ref 0.3–1.2)

## 2013-08-13 LAB — LIPID PANEL
CHOL/HDL RATIO: 5.8 ratio
CHOLESTEROL: 179 mg/dL (ref 0–200)
HDL: 31 mg/dL — AB (ref >39)
LDL CALC: 120 mg/dL — AB (ref 0–99)
Triglycerides: 141 mg/dL (ref <150)
VLDL: 28 mg/dL (ref 0–40)

## 2013-08-13 LAB — FOLATE: Folate: >20 ng/mL

## 2013-08-13 LAB — GLUCOSE, CAPILLARY
Glucose-Capillary: 282 mg/dL — ABNORMAL HIGH (ref 70–99)
Glucose-Capillary: 277 mg/dL — ABNORMAL HIGH (ref 70–99)
Glucose-Capillary: 224 mg/dL — ABNORMAL HIGH (ref 70–99)

## 2013-08-13 LAB — HEMOGLOBIN A1C
Hgb A1c MFr Bld: 11.7 % — ABNORMAL HIGH (ref <5.7)
Mean Plasma Glucose: 289 mg/dL — ABNORMAL HIGH (ref <117)

## 2013-08-13 LAB — FERRITIN: Ferritin: 8 ng/mL — ABNORMAL LOW (ref 10–291)

## 2013-08-13 LAB — MAGNESIUM: Magnesium: 1.7 mg/dL (ref 1.5–2.5)

## 2013-08-13 MED ORDER — POTASSIUM CHLORIDE CRYS ER 20 MEQ PO TBCR
40.0000 meq | EXTENDED_RELEASE_TABLET | Freq: Once | ORAL | Status: AC
  Administered 2013-08-13: 40 meq via ORAL
  Filled 2013-08-13 (×2): qty 2

## 2013-08-13 MED ORDER — ATORVASTATIN CALCIUM 20 MG PO TABS
20.0000 mg | ORAL_TABLET | Freq: Every day | ORAL | Status: DC
Start: 2013-08-13 — End: 2013-08-14
  Administered 2013-08-13: 20 mg via ORAL
  Filled 2013-08-13 (×2): qty 1

## 2013-08-13 MED ORDER — METOPROLOL TARTRATE 25 MG PO TABS
25.0000 mg | ORAL_TABLET | Freq: Two times a day (BID) | ORAL | Status: DC
  Administered 2013-08-13 – 2013-08-14 (×3): 25 mg via ORAL
  Filled 2013-08-13 (×5): qty 1

## 2013-08-13 MED ORDER — AMLODIPINE BESYLATE 10 MG PO TABS
10.0000 mg | ORAL_TABLET | Freq: Every day | ORAL | Status: DC
  Administered 2013-08-14: 10 mg via ORAL
  Filled 2013-08-13: qty 1

## 2013-08-13 NOTE — Progress Notes (Signed)
VASCULAR LAB PRELIMINARY  PRELIMINARY  PRELIMINARY  PRELIMINARY  Carotid duplex completed.    Preliminary report:  Bilateral:  1-39% ICA stenosis.  Vertebral artery flow is antegrade.      Jermeka Schlotterbeck, RVT 08/13/2013, 9:25 AM

## 2013-08-13 NOTE — Progress Notes (Signed)
[facility] PROGRESS NOTE  Kimyatta Haney-McDonald RUE:[id number] DOB: 09-06-1970 DOA: 08/12/2013 PCP: Pcp Not In System  Assessment/Plan: Acute left thalamic infarct - secondary to small vessel disease 2-D echo - diastolic dfnx carotid Dopplers-39% ICA stenosis. Vertebral artery flow is antegrade  hemoglobin A1c  lipid panel- LDL elevated- statin added physical therapy eval pending  Aspirin.   HLD -statin  Diabetes mellitus type 2 uncontrolled - closely follow CBGs with sliding-scale coverage. Check hemoglobin A1c.   Hypertension uncontrolled - -resuming norvasc and added BB -PRN hydralazine   Anemia - outpatient work up  Tobacco abuse - patient advised to quit smoking.   Mild hypokalemia - replace and recheck   Code Status: full Family Communication: patient Disposition Plan: d/c in AM if BP better   Consultants:  neuro  Procedures: Echo: - Left ventricle: The cavity size was normal. Wall thickness was increased in a pattern of moderate LVH. Systolic function was normal. The estimated ejection fraction was in the range of 60% to 65%. Wall motion was normal; there were no regional wall motion abnormalities. Doppler parameters are consistent with abnormal left ventricular relaxation (grade 1 diastolic dysfunction). - Aortic valve: Trileaflet; normal thickness leaflets. No regurgitation. - Aortic root: The aortic root was normal in size. - Mitral valve: No regurgitation. - Left atrium: The atrium was mildly dilated. - Right ventricle: The cavity size was normal. Systolic function was normal. Systolic pressure was within the normal range. - Pulmonic valve: No regurgitation. - Inferior vena cava: The vessel was normal in size. - Pericardium, extracardiac: There was no pericardial effusion.  Carotids  Antibiotics:    HPI/Subjective: No headache Still having right arm tingling  Objective: Filed Vitals:   08/13/13 1117  BP: 196/80  Pulse:    Temp:   Resp:     Intake/Output Summary (Last 24 hours) at 08/13/13 1204 Last data filed at 08/13/13 0434  Gross per 24 hour  Intake    240 ml  Output      2 ml  Net    238 ml   Filed Weights   08/12/13 2338  Weight: 99.5 kg (219 lb 5.7 oz)    Exam:  General: Well-developed and nourished.  Cardiovascular: S1-S2 heard.  Respiratory: No rhonchi or crepitations.  Abdomen: Soft nontender bowel sounds present.   Neurologic: Alert awake oriented to time place and person. Moves all extremities 5 x 5. No facial asymmetry. No tongue deviation. PERRLA posterior   Data Reviewed: Basic Metabolic Panel:  Recent Labs Lab 08/12/13 1300 08/13/13 0630  NA 134* 136*  K 3.2* 3.1*  CL 96 98  CO2 27 23  GLUCOSE 270* 274*  BUN 6 8  CREATININE 0.74 0.81  CALCIUM 8.9 8.8   Liver Function Tests:  Recent Labs Lab 08/13/13 0630  AST 14  ALT 17  ALKPHOS 60  BILITOT 0.5  PROT 7.5  ALBUMIN 3.2*   No results found for this basename: LIPASE, AMYLASE,  in the last 168 hours No results found for this basename: AMMONIA,  in the last 168 hours CBC:  Recent Labs Lab 08/12/13 1300 08/13/13 0630  WBC 7.6 7.6  NEUTROABS 5.2 5.2  HGB 10.2* 10.2*  HCT 32.0* 31.3*  MCV 73.6* 72.6*  PLT 259 267   Cardiac Enzymes:  Recent Labs Lab 08/12/13 1300  TROPONINI <0.30   BNP (last 3 results) No results found for this basename: PROBNP,  in the last 8760 hours CBG:  Recent Labs Lab 08/12/13 2343 08/13/13 0706 08/13/13 1112  GLUCAP 142* 282* 277*    No results found for this or any previous visit (from the past 240 hour(s)).   Studies: Ct Head Wo Contrast  08/12/2013   CLINICAL DATA:  Numbness  EXAM: CT HEAD WITHOUT CONTRAST  TECHNIQUE: Contiguous axial images were obtained from the base of the skull through the vertex without intravenous contrast.  COMPARISON:  None.  FINDINGS: 3.6 x 2.7 cm low-density lesion along the left frontal lobe (series 2/ image 10). Associated central  calcification (series 2/image 11). Exact location of the lesion (intra-axial versus extra-axial) is unclear.  Primary differential considerations include chronic infarct (if intra-axial) or possibly an arachnoid cyst (if extra-axial). Acute/subacute infarct or solid mass are considered less likely.  No evidence of parenchymal hemorrhage or extra-axial fluid collection.  No midline shift.  Basal cisterns are patent.  Cerebral volume is within normal limits. No ventriculomegaly.  The visualized paranasal sinuses are essentially clear. The mastoid air cells are unopacified.  No evidence of calvarial fracture.  IMPRESSION: 3.6 cm low-density lesion along the left frontal lobe, as described above. Primary differential considerations include chronic infarct or arachnoid cyst. MRI brain with/without contrast is suggested for further evaluation.  These results were called by telephone at the time of interpretation on 08/12/2013 at 1:31 PM to [facility] , who verbally acknowledged these results.   Electronically Signed   By: Charline Bills M.D.   On: 08/12/2013 13:31   Mr Maxine Glenn Head Wo Contrast  08/12/2013   CLINICAL DATA:  Stroke.  Numbness.  Abnormal CT  EXAM: MRI HEAD WITHOUT CONTRAST  MRA HEAD WITHOUT CONTRAST  TECHNIQUE: Multiplanar, multiecho pulse sequences of the brain and surrounding structures were obtained without intravenous contrast. Angiographic images of the head were obtained using MRA technique without contrast.  COMPARISON:  CT 08/12/2013  FINDINGS: MRI HEAD FINDINGS  Acute infarct left thalamus measuring approximately 13 mm in diameter. No other areas of acute infarct.  Encephalomalacia left frontal temporal lobe compatible with chronic infarct, as noted on the CT.  Chronic microvascular ischemic change throughout the white matter. Hyperintensity right lateral cerebellum most likely is an area of chronic infarct. This area does not show any restricted diffusion.  MRA HEAD FINDINGS  Atherosclerotic  disease in both vertebral arteries which are patent to the basilar. The basilar is patent with mild atherosclerotic disease. Superior cerebellar and posterior cerebral arteries are patent with moderate atherosclerotic disease diffusely.  Cavernous carotid is irregular with mild to moderate stenosis bilaterally. Both anterior cerebral arteries are patent. There is a moderate to severe stenosis in the left A1 segment.  The right middle cerebral artery is patent with mild atherosclerotic disease. Mild atherosclerotic disease in the left middle cerebral artery.  Negative for cerebral aneurysm.  IMPRESSION: Acute infarct left thalamus  Chronic infarct left frontal temporal lobe and throughout the cerebral white matter. Hyperintensity right lateral cerebellum appears to represent a chronic infarct.  Diffuse intracranial atherosclerotic disease without large vessel occlusion.  I discussed the findings by telephone with Polly Cobia PA   Electronically Signed   By: Marlan Palau M.D.   On: 08/12/2013 19:02   Mr Brain Wo Contrast  08/12/2013   CLINICAL DATA:  Stroke.  Numbness.  Abnormal CT  EXAM: MRI HEAD WITHOUT CONTRAST  MRA HEAD WITHOUT CONTRAST  TECHNIQUE: Multiplanar, multiecho pulse sequences of the brain and surrounding structures were obtained without intravenous contrast. Angiographic images of the head were obtained using MRA technique without contrast.  COMPARISON:  CT 08/12/2013  FINDINGS: MRI HEAD FINDINGS  Acute infarct left thalamus measuring approximately 13 mm in diameter. No other areas of acute infarct.  Encephalomalacia left frontal temporal lobe compatible with chronic infarct, as noted on the CT.  Chronic microvascular ischemic change throughout the white matter. Hyperintensity right lateral cerebellum most likely is an area of chronic infarct. This area does not show any restricted diffusion.  MRA HEAD FINDINGS  Atherosclerotic disease in both vertebral arteries which are patent to the basilar.  The basilar is patent with mild atherosclerotic disease. Superior cerebellar and posterior cerebral arteries are patent with moderate atherosclerotic disease diffusely.  Cavernous carotid is irregular with mild to moderate stenosis bilaterally. Both anterior cerebral arteries are patent. There is a moderate to severe stenosis in the left A1 segment.  The right middle cerebral artery is patent with mild atherosclerotic disease. Mild atherosclerotic disease in the left middle cerebral artery.  Negative for cerebral aneurysm.  IMPRESSION: Acute infarct left thalamus  Chronic infarct left frontal temporal lobe and throughout the cerebral white matter. Hyperintensity right lateral cerebellum appears to represent a chronic infarct.  Diffuse intracranial atherosclerotic disease without large vessel occlusion.  I discussed the findings by telephone with Polly CobiaJen Piepenbrick PA   Electronically Signed   By: Marlan Palauharles  Clark M.D.   On: 08/12/2013 19:02   Dg Chest Port 1 View  08/13/2013   CLINICAL DATA:  Acute left thalamus stroke  EXAM: PORTABLE CHEST - 1 VIEW  COMPARISON:  None.  FINDINGS: The heart size and mediastinal contours are within normal limits. Both lungs are clear. The visualized skeletal structures are unremarkable.  IMPRESSION: No active disease.   Electronically Signed   By: Ruel Favorsrevor  Shick M.D.   On: 08/13/2013 02:55    Scheduled Meds: . [START ON 08/14/2013] amLODipine  10 mg Oral Daily  . aspirin  300 mg Rectal Daily   Or  . aspirin  325 mg Oral Daily  . atorvastatin  20 mg Oral q1800  . enoxaparin (LOVENOX) injection  40 mg Subcutaneous Q24H  . insulin aspart  0-9 Units Subcutaneous TID WC  . metoprolol tartrate  25 mg Oral BID  . potassium chloride  40 mEq Oral Once   Continuous Infusions:   Principal Problem:   CVA (cerebral infarction) Active Problems:   HTN (hypertension)   Diabetes mellitus    Time spent: 25 min    VANN, JESSICA  [facility] Pager [phone]. If 7PM-7AM,  please contact night-coverage at [email or web address], password [email or web address] 08/13/2013, 12:04 PM  LOS: 1 day

## 2013-08-13 NOTE — Evaluation (Signed)
Speech Language Pathology Evaluation Patient Details Name: Michele [name] MRN: [id number] DOB: 1970/12/27 Today's Date: 08/13/2013 Time: [id number] SLP Time Calculation (min): 26 min  Problem List:  Patient Active Problem List   Diagnosis Date Noted  . CVA (cerebral infarction) 08/12/2013  . HTN (hypertension) 08/12/2013  . Diabetes mellitus 08/12/2013   Past Medical History:  Past Medical History  Diagnosis Date  . Diabetes mellitus   . Hypertension    Past Surgical History:  Past Surgical History  Procedure Laterality Date  . Btl    . Cesarean section    . Knee arthroscopy    . 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  MRI + for acute left thalamic cva, chronic left frontal temporal cva and ? right lateral cerebellum chronic infarct.  Pt reports she noted the numbness but was hoping it would resolve.  She denies changes with speech, communication.     Assessment / Plan / Recommendation Clinical Impression  Pt presents with functional cognitive linguistic skills without evidence of dysarthria, aphasia or significant cognitive deficits.  Intact syntax, semantics and use apparent.  She is able to state all of her medications she takes prior to admission and admits to non-compliance.  Pt able to name 18 animals in 60 seconds.  Pt able to recall 2/4 words independently, 2/4 with choice cue demonstrating mild impairment - but functionally pt with better performance.  SLP educated pt to activities to tax attention skills if she notes changes.  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   All education completed, no furhter SlP indicated.     SLP Assessment  Patient does not need any further Speech Lanaguage Pathology Services    Follow Up Recommendations       Frequency and Duration        Pertinent Vitals/Pain Afebrile, decreased     SLP Evaluation Prior Functioning  Cognitive/Linguistic Baseline: Within functional limits  Lives With: Family (2 children age 43 and 2915) Education: bachelor's degree in social work Vocation: Full time employment   Cognition  Overall Cognitive Status: Within Systems developerunctional Limits for tasks assessed Orientation Level: Oriented X4 Attention: Sustained Memory:  (able to recall 4 words immediately, 2/4 independently and 2/4 with choice cue within 15 minutes) Awareness: Appears intact (aware of numbness likely impacting writing, aware of need to stop smoking) Problem Solving: Appears intact Safety/Judgment: Appears intact    Comprehension  Auditory Comprehension Overall Auditory Comprehension: Appears within functional limits for tasks assessed Yes/No Questions: Not tested Commands: Within Functional Limits Conversation: Complex Visual Recognition/Discrimination Discrimination: Not tested Reading Comprehension Reading Status: Within funtional limits    Expression Expression Primary Mode of Expression: Verbal Verbal Expression Overall Verbal Expression: Appears within functional limits for tasks assessed Initiation: No impairment Level of Generative/Spontaneous Verbalization: Conversation Naming: Not tested Pragmatics: No impairment Written Expression Dominant Hand: Right Written Expression: Not tested (pt expressed concern re; writing, defer to OT)   Oral / Motor Oral Motor/Sensory Function Overall Oral Motor/Sensory Function: Appears within functional limits for tasks assessed Motor Speech Pt admits to sensory changes, numbness on right side of neck and cheek, ? Trigeminal nerve impact?  Overall Motor Speech: Appears within functional limits for tasks assessed   GO     Mills KollerKimball, Jayleon Mcfarlane Ann Johnta Couts, MS [facility] SLP [phone]

## 2013-08-13 NOTE — Progress Notes (Signed)
  Echocardiogram 2D Echocardiogram has been performed.  Jorje GuildCHUNG, Thedore Pickel 08/13/2013, 9:32 AM

## 2013-08-13 NOTE — ED Provider Notes (Signed)
Medical screening examination/treatment/procedure(s) were conducted as a shared visit with non-physician practitioner(s) and myself.  I personally evaluated the patient during the encounter.  EKG Interpretation    Date/Time:    Ventricular Rate:    PR Interval:    QRS Duration:   QT Interval:    QTC Calculation:   R Axis:     Text Interpretation:               Jarmar Rousseau, MD 08/13/13 1412 

## 2013-08-13 NOTE — ED Provider Notes (Signed)
Medical screening examination/treatment/procedure(s) were conducted as a shared visit with non-physician practitioner(s) and myself.  I personally evaluated the patient during the encounter.  EKG Interpretation    Date/Time:    Ventricular Rate:    PR Interval:    QRS Duration:   QT Interval:    QTC Calculation:   R Axis:     Text Interpretation:               Doug SouSam Jacqueline Delapena, MD 08/13/13 [phone]

## 2013-08-13 NOTE — Progress Notes (Signed)
Nutrition Brief Note  Patient identified on the Malnutrition Screening Tool (MST) Report  Wt Readings from Last 15 Encounters:  08/12/13 219 lb 5.7 oz (99.5 kg)    Body mass index is 42.84 kg/(m^2). Patient meets criteria for Morbidly Obese based on current BMI.   Current diet order is Carb Modified, patient is consuming approximately 75% of meals at this time. Pt states that she doesn't eat much and she typically drinks a lot of soda and sweet tea. She expresses having a difficult time figuring out what to eat each day.   Pt interested in receiving nutrition education regarding a Heart Healthy diet.   Lipid Panel     Component Value Date/Time   CHOL 179 08/13/2013 0630   TRIG 141 08/13/2013 0630   HDL 31* 08/13/2013 0630   CHOLHDL 5.8 08/13/2013 0630   VLDL 28 08/13/2013 0630   LDLCALC 120* 08/13/2013 0630    RD provided "Heart Healthy Nutrition Therapy" handout from the Academy of Nutrition and Dietetics as well as "1800-Calorie 5-Day Menus". Reviewed patient's dietary recall. Discouraged intake of processed foods. Encouraged pt to use olive oil or canola oil in place of butter. Encouraged intake of fish. Encouraged fresh fruits and vegetables as well as whole grain sources of carbohydrates to maximize fiber intake. Encouraged pt to switch from soda/sweet tea to sugar-free beverages and water. Teach back method used. Pt states she will try to wean herself off of Pepsi, will eat more whole grains, fruits, and vegetables, and will try to eat fish more often.   Expect fair compliance.  No further nutrition interventions warranted at this time. RD contact information provided. If additional nutrition issues arise, please consult RD.  Ian Malkineanne Barnett RD, LDN Inpatient Clinical Dietitian Pager: [phone] After Hours Pager: [phone]

## 2013-08-13 NOTE — Evaluation (Signed)
Physical Therapy Evaluation Patient Details Name: Michele [name] MRN: [id number] DOB: January 06, 1971 Today's Date: 08/13/2013 Time: [id number] PT Time Calculation (min): 18 min  PT Assessment / Plan / Recommendation History of Present Illness  Michele [name] is an 43 y.o. female with a history of hypertension and diabetes mellitus who woke up with numbness and tingling involving her left side on the morning of 08/11/2013. Patient is now s/p acute infarct left thalamus.  Clinical Impression  No acute PT needs, independent with mobility. Will need OT assessment. Physical therapy to sign off, patient safe for dc home when medically ready.    PT Assessment  Patent does not need any further PT services    Follow Up Recommendations  No PT follow up    Does the patient have the potential to tolerate intense rehabilitation      Barriers to Discharge        Equipment Recommendations  None recommended by PT    Recommendations for Other Services OT consult   Frequency      Precautions / Restrictions     Pertinent Vitals/Pain No pain at this time      Mobility  Bed Mobility Overal bed mobility: Independent Transfers Overall transfer level: Independent Ambulation/Gait Ambulation/Gait assistance: Independent Ambulation Distance (Feet): 420 Feet Assistive device: None Gait Pattern/deviations: WFL(Within Functional Limits) Stairs: Yes Stairs assistance: Independent Stair Management: One rail Right;Alternating pattern;Forwards Number of Stairs: 5 Modified Rankin (Stroke Patients Only) Pre-Morbid Rankin Score: No symptoms Modified Rankin: Slight disability       PT Goals(Current goals can be found in the care plan section) Acute Rehab PT Goals PT Goal Formulation: No goals set, d/c therapy  Visit Information  Last PT Received On: 08/13/13 Assistance Needed: +1 History of Present Illness: Michele [name] is an [age] y.o. female with a history of  hypertension and diabetes mellitus who woke up with numbness and tingling involving her left side on the morning of 08/11/2013. Patient is now s/p acute infarct left thalamus.       Prior Functioning  Home Living Family/patient expects to be discharged to:: Private residence Living Arrangements: Children Available Help at Discharge: Family Type of Home: House Home Access: Stairs to enter Secretary/administratorntrance Stairs-Number of Steps: 3 Entrance Stairs-Rails: Can reach both Home Layout: One level Home Equipment: None  Lives With: Family (2 children age 43 and 6715) Dominant Hand: Right    Cognition  Cognition Arousal/Alertness: Awake/alert Behavior During Therapy: WFL for tasks assessed/performed Overall Cognitive Status: Within Functional Limits for tasks assessed    Extremity/Trunk Assessment Upper Extremity Assessment Upper Extremity Assessment: Defer to OT evaluation Lower Extremity Assessment Lower Extremity Assessment: Overall WFL for tasks assessed   Balance Balance Overall balance assessment: No apparent balance deficits (not formally assessed) Standardized Balance Assessment Standardized Balance Assessment : Dynamic Gait Index Dynamic Gait Index Level Surface: Normal Change in Gait Speed: Normal Gait with Horizontal Head Turns: Normal Gait with Vertical Head Turns: Mild Impairment Gait and Pivot Turn: Normal Step Over Obstacle: Normal Step Around Obstacles: Normal Steps: Normal Total Score: 23  End of Session PT - End of Session Equipment Utilized During Treatment: Gait belt Activity Tolerance: Patient tolerated treatment well Patient left: in bed;with call bell/phone within reach Nurse Communication: Mobility status  GP     Fabio AsaWerner, Stanislaw Acton J 08/13/2013, 4:06 PM Charlotte Crumbevon Glendi Mohiuddin, PT DPT  [phone]

## 2013-08-13 NOTE — Progress Notes (Signed)
Stroke Team Progress Note  HISTORY Michele [name] is an 43 y.o. female with a history of hypertension and diabetes mellitus who woke up with numbness and tingling involving her left side on the morning of 08/11/2013. She was last known well at about 12:30 AM earlier that morning when she went to bed. She has no documented history of previous stroke or TIA. She has not been on antiplatelet therapy. CT scan of the head showed acute left thalamic infarction, as well as an old left frontotemporal infarction as well as possible old right cerebellar stroke. NIH stroke score was 1.  Patient was not administerd TPA secondary to delay in arrival. She was admitted for further evaluation and treatment.  SUBJECTIVE No family is at the bedside.  Overall she feels her condition is gradually improving, though the numbness is still present. "I know I need to keep taking my medicine."  OBJECTIVE Most recent Vital Signs: Filed Vitals:   08/12/13 2338 08/13/13 0200 08/13/13 0400 08/13/13 0700  BP: 164/93 179/104 205/81 178/100  Pulse: 89 96 97 99  Temp: 98.7 F (37.1 C) 98.7 F (37.1 C) 98.4 F (36.9 C) 98.7 F (37.1 C)  TempSrc: Oral Oral Oral Oral  Resp: 18 18 20 20   Height: 5' (1.524 m)     Weight: 99.5 kg (219 lb 5.7 oz)     SpO2: 99% 99% 97% 100%   CBG (last 3)   Recent Labs  08/12/13 2343 08/13/13 0706  GLUCAP 142* 282*    IV Fluid Intake:   . sodium chloride 50 mL/hr at 08/13/13 0154    MEDICATIONS  . amLODipine  5 mg Oral Daily  . aspirin  300 mg Rectal Daily   Or  . aspirin  325 mg Oral Daily  . enoxaparin (LOVENOX) injection  40 mg Subcutaneous Q24H  . insulin aspart  0-9 Units Subcutaneous TID WC   PRN:  albuterol, hydrALAZINE, senna-docusate  Diet:  Carb Control thin liquids Activity:  OOB  with assistance DVT Prophylaxis:  Lovenox 40 mg sq daily   CLINICALLY SIGNIFICANT STUDIES Basic Metabolic Panel:  Recent Labs Lab 08/12/13 1300 08/13/13 0630  NA 134* 136*   K 3.2* 3.1*  CL 96 98  CO2 27 23  GLUCOSE 270* 274*  BUN 6 8  CREATININE 0.74 0.81  CALCIUM 8.9 8.8   Liver Function Tests:  Recent Labs Lab 08/13/13 0630  AST 14  ALT 17  ALKPHOS 60  BILITOT 0.5  PROT 7.5  ALBUMIN 3.2*   CBC:  Recent Labs Lab 08/12/13 1300 08/13/13 0630  WBC 7.6 7.6  NEUTROABS 5.2 5.2  HGB 10.2* 10.2*  HCT 32.0* 31.3*  MCV 73.6* 72.6*  PLT 259 267   Coagulation: No results found for this basename: LABPROT, INR,  in the last 168 hours Cardiac Enzymes:  Recent Labs Lab 08/12/13 1300  TROPONINI <0.30   Urinalysis:  Recent Labs Lab 08/12/13 1334  COLORURINE YELLOW  LABSPEC 1.025  PHURINE 7.0  GLUCOSEU >1000*  HGBUR NEGATIVE  BILIRUBINUR NEGATIVE  KETONESUR NEGATIVE  PROTEINUR NEGATIVE  UROBILINOGEN 0.2  NITRITE NEGATIVE  LEUKOCYTESUR NEGATIVE   Lipid Panel    Component Value Date/Time   CHOL 179 08/13/2013 0630   TRIG 141 08/13/2013 0630   HDL 31* 08/13/2013 0630   CHOLHDL 5.8 08/13/2013 0630   VLDL 28 08/13/2013 0630   LDLCALC 120* 08/13/2013 0630   HgbA1C  No results found for this basename: HGBA1C    Urine Drug Screen:   No  results found for this basename: labopia, cocainscrnur, labbenz, amphetmu, thcu, labbarb    Alcohol Level: No results found for this basename: ETH,  in the last 168 hours   CT of the brain  08/12/2013    3.6 cm low-density lesion along the left frontal lobe, as described above. Primary differential considerations include chronic infarct or arachnoid cyst. MRI brain with/without contrast is suggested for further evaluation.  MRI of the brain  08/12/2013    Acute infarct left thalamus  Chronic infarct left frontal temporal lobe and throughout the cerebral white matter. Hyperintensity right lateral cerebellum appears to represent a chronic infarct.   MRA of the brain  08/12/2013     Diffuse intracranial atherosclerotic disease without large vessel occlusion.  2D Echocardiogram    Carotid Doppler  No evidence  of hemodynamically significant internal carotid artery stenosis. Vertebral artery flow is antegrade.   CXR  08/13/2013   No active disease.   EKG  normal sinus rhythm. For complete results please see formal report.   Therapy Recommendations   Physical Exam   Young african american obese lady not in distress.Awake alert. Afebrile. Head is nontraumatic. Neck is supple without bruit. Hearing is normal. Cardiac exam no murmur or gallop. Lungs are clear to auscultation. Distal pulses are well felt. Neurological Exam :   Awake  Alert oriented x 3. Normal speech and language.eye movements full without nystagmus.fundi were not visualized. Vision acuity and fields appear normal. Hearing is normal. Palatal movements are normal. Face symmetric. Tongue midline. Normal strength, tone, reflexes and coordination. Normal sensation but subjective paresthesias right hemibody. Gait deferred. ASSESSMENT [name] is a 43 y.o. female presenting with new onset right sided numbness. Imaging confirms a left thalamic infarct. Infarct felt to be thrombotic secondary to small vessel disease.  On no antithrombotics prior to admission. Now on aspirin 325 mg orally every day for secondary stroke prevention. Patient with resultant left hemisensory deficit. Workup underway.  hypertension Diabetes, HgbA1c pending, goal < 7.0 Hyperlipidemia, LDL 120, on no statin PTA, now on no statin, goal LDL < 70 for diabetics Morbid obesity, Body mass index is 42.84 kg/(m^2).  Cigarette smoker  Hospital day # 1  TREATMENT/PLAN  Continue aspirin 325 mg orally every day for secondary stroke prevention.  Add statin - i added lipitor 20 mg daily  F/u HgbA1c, 2D  Therapy evals  Stop smoking, lose weight  Ongoing risk factor control  SHARON BIBY, MSN, RN, ANVP-BC, ANP-BC, GNP-BC [facility] Pager: [phone] 08/13/2013 10:33 AM  I have personally obtained a history, examined the patient,  evaluated imaging results, and formulated the assessment and plan of care. I agree with the above. Delia Heady, MD

## 2013-08-13 NOTE — Progress Notes (Signed)
Inpatient Diabetes Program Recommendations  AACE/ADA: New Consensus Statement on Inpatient Glycemic Control (2013)  Target Ranges:  Prepandial:   less than 140 mg/dL      Peak postprandial:   less than 180 mg/dL (1-2 hours)      Critically ill patients:  140 - 180 mg/dL   Fasting Hyperglycemia: Request for Lantus/basal insulin  Inpatient Diabetes Program Recommendations Insulin - Basal: Pt would benefit from addition of basal insulin during this time. Start with 10-15 units daily or HS HgbA1C: Noted intention to order HgbA1C, however it does not appear that it has been ordered yet.  Thank you, Lenor CoffinAnn Maysin Carstens, RN, CNS, Diabetes Coordinator [phone])

## 2013-08-13 NOTE — Progress Notes (Signed)
UR complete.  Evora Schechter RN, MSN 

## 2013-08-14 LAB — CBC
HCT: 31.1 % — ABNORMAL LOW (ref 36.0–46.0)
HEMOGLOBIN: 10.1 g/dL — AB (ref 12.0–15.0)
MCH: 23.8 pg — ABNORMAL LOW (ref 26.0–34.0)
MCHC: 32.5 g/dL (ref 30.0–36.0)
MCV: 73.2 fL — AB (ref 78.0–100.0)
Platelets: 232 10*3/uL (ref 150–400)
RBC: 4.25 MIL/uL (ref 3.87–5.11)
RDW: 17.2 % — AB (ref 11.5–15.5)
WBC: 6.7 10*3/uL (ref 4.0–10.5)

## 2013-08-14 LAB — BASIC METABOLIC PANEL
BUN: 12 mg/dL (ref 6–23)
CHLORIDE: 101 meq/L (ref 96–112)
CO2: 24 meq/L (ref 19–32)
Calcium: 8.6 mg/dL (ref 8.4–10.5)
Creatinine, Ser: 0.79 mg/dL (ref 0.50–1.10)
GFR calc Af Amer: >90 mL/min (ref >90)
GFR calc non Af Amer: >90 mL/min (ref >90)
Glucose, Bld: 244 mg/dL — ABNORMAL HIGH (ref 70–99)
POTASSIUM: 3.4 meq/L — AB (ref 3.7–5.3)
Sodium: 140 mEq/L (ref 137–147)

## 2013-08-14 LAB — GLUCOSE, CAPILLARY
GLUCOSE-CAPILLARY: 240 mg/dL — AB (ref 70–99)
Glucose-Capillary: 249 mg/dL — ABNORMAL HIGH (ref 70–99)

## 2013-08-14 LAB — IRON AND TIBC
IRON: 35 ug/dL — AB (ref 42–135)
Saturation Ratios: 10 % — ABNORMAL LOW (ref 20–55)
TIBC: 367 ug/dL (ref 250–470)
UIBC: 332 ug/dL (ref 125–400)

## 2013-08-14 MED ORDER — ASPIRIN 325 MG PO TABS: 325.0000 mg | ORAL_TABLET | Freq: Every day | ORAL | Status: DC

## 2013-08-14 MED ORDER — ATORVASTATIN CALCIUM 20 MG PO TABS: 20.0000 mg | ORAL_TABLET | Freq: Every day | ORAL | Status: DC

## 2013-08-14 MED ORDER — GLIPIZIDE 5 MG PO TABS: 5.0000 mg | ORAL_TABLET | Freq: Every day | ORAL | Status: DC

## 2013-08-14 MED ORDER — METOPROLOL TARTRATE 25 MG PO TABS: 25.0000 mg | ORAL_TABLET | Freq: Two times a day (BID) | ORAL | Status: DC

## 2013-08-14 MED ORDER — AMLODIPINE BESYLATE 10 MG PO TABS: 10.0000 mg | ORAL_TABLET | Freq: Every day | ORAL | Status: DC

## 2013-08-14 MED ORDER — FREESTYLE SYSTEM KIT: 1.0000 | PACK | Status: AC | PRN

## 2013-08-14 NOTE — Progress Notes (Signed)
  Pt will likely need basal insulin. Please consider starting it while here at 15 units daily or HS. Fasting glucose is elevated in 200's.  Can titrate dose by 4-5 units until fasting is controlled at <= 150 mg/dL.  Thank you, Lenor CoffinAnn Zahra Peffley, RN, CNS, Diabetes Coordinator [phone])

## 2013-08-14 NOTE — Discharge Summary (Signed)
Physician Discharge Summary  Michele [name] PFX:[id number] DOB: [birth year] DOA: 08/12/2013  PCP: [place] date: 08/12/2013 Discharge date: 08/14/2013  Time spent: 30 minutes  Recommendations for Outpatient Follow-up:  Needs tighter DM control- patient did not want insulin so will need HgbA1c in 3 months after resumption of meds, exercise and diet Titrate BP meds BMp 1 week  Discharge Diagnoses:  Principal Problem:   CVA (cerebral infarction) Active Problems:   HTN (hypertension)   Diabetes mellitus   HLD (hyperlipidemia)   Discharge Condition: improved  Diet recommendation: diabetic/cardiac  Filed Weights   08/12/13 2338  Weight: 99.5 kg (219 lb 5.7 oz)    History of present illness:  Michele [name] is a 43 y.o. female history of hypertension, diabetes mellitus and ongoing tobacco abuse presents to the ER because of persistent numbness of the upper and lower extremities. Patient has been having these symptoms since yesterday morning. Denies any weakness of the extremities. Denies any headache visual symptoms difficulty speaking swallowing. In the ER patient MRI brain which showed acute left thalamic infarct. On-call neurologist Dr. Nicole Kindred has advised patient to be transferred to [facility] for further stroke workup. Patient is in agreement with transfer. Patient denies any chest pain shortness of breath palpitations nausea vomiting abdominal pain diarrhea dizziness.    Hospital Course:  Acute left thalamic infarct - secondary to small vessel disease  2-D echo - diastolic dfnx  carotid [id number]% ICA stenosis. Vertebral artery flow is antegrade  hemoglobin A1c almost 12- patient has not been taking metformin, and not following diabetic diet- REFUSING to go on insulin- prefers PO meds- needs HgbA1c in 3 months to see if improvement lipid panel- LDL elevated- statin added  No PT eval needed Aspirin.   HLD  -statin    Diabetes mellitus type 2 uncontrolled - see above- does not want insulin   Hypertension uncontrolled -  -resuming norvasc and added BB    Anemia - outpatient work up   Tobacco abuse - patient advised to quit smoking.   Mild hypokalemia - replace and recheck      Procedures:  Echo:  Study Conclusions  - Left ventricle: The cavity size was normal. Wall thickness was increased in a pattern of moderate LVH. Systolic function was normal. The estimated ejection fraction was in the range of 60% to 65%. Wall motion was normal; there were no regional wall motion abnormalities. Doppler parameters are consistent with abnormal left ventricular relaxation (grade 1 diastolic dysfunction). - Aortic valve: Trileaflet; normal thickness leaflets. No regurgitation. - Aortic root: The aortic root was normal in size. - Mitral valve: No regurgitation. - Left atrium: The atrium was mildly dilated. - Right ventricle: The cavity size was normal. Systolic function was normal. Systolic pressure was within the normal range. - Pulmonic valve: No regurgitation. - Inferior vena cava: The vessel was normal in size. - Pericardium, extracardiac: There was no pericardial effusion.    Consultations:  neuro  Discharge Exam: Filed Vitals:   08/14/13 1312  BP: 154/90  Pulse: 82  Temp:   Resp:     General: A+Ox3, NAD Cardiovascular: rrr Respiratory: clear anterior  Discharge Instructions      Discharge Orders   Future Orders Complete By Expires   Diet - low sodium heart healthy  As directed    Diet Carb Modified  As directed    Discharge instructions  As directed    Comments:     Check blood sugar at home  and bring to PCP CBC, BMP 1 week   Increase activity slowly  As directed        Medication List    STOP taking these medications       hydrochlorothiazide 25 MG tablet  Commonly known as:  HYDRODIURIL     ibuprofen 200 MG tablet  Commonly known as:  ADVIL,MOTRIN       TAKE these medications       albuterol 108 (90 BASE) MCG/ACT inhaler  Commonly known as:  PROVENTIL HFA;VENTOLIN HFA  Inhale 2 puffs into the lungs every 4 (four) hours as needed for wheezing.     amLODipine 10 MG tablet  Commonly known as:  NORVASC  Take 1 tablet (10 mg total) by mouth daily.     aspirin 325 MG tablet  Take 1 tablet (325 mg total) by mouth daily.     atorvastatin 20 MG tablet  Commonly known as:  LIPITOR  Take 1 tablet (20 mg total) by mouth daily at 6 PM.     glipiZIDE 5 MG tablet  Commonly known as:  GLUCOTROL  Take 1 tablet (5 mg total) by mouth daily before breakfast.     glucose monitoring kit monitoring kit  1 each by Does not apply route as needed for other.     metFORMIN 1000 MG tablet  Commonly known as:  GLUCOPHAGE  Take 1,000 mg by mouth 2 (two) times daily with a meal.     metoprolol tartrate 25 MG tablet  Commonly known as:  LOPRESSOR  Take 1 tablet (25 mg total) by mouth 2 (two) times daily.     PRESCRIPTION MEDICATION  Take 1 tablet by mouth daily. Sample product from the doctor's office: Iron with probiotic.       No Known Allergies Follow-up Information   Follow up with Forbes Cellar, MD. Schedule an appointment as soon as possible for a visit in 2 months. (stroke clinic)    Specialties:  Neurology, Radiology   Contact information:   [address] [place] [address] [phone]        The results of significant diagnostics from this hospitalization (including imaging, microbiology, ancillary and laboratory) are listed below for reference.    Significant Diagnostic Studies: Ct Head Wo Contrast  08/12/2013   CLINICAL DATA:  Numbness  EXAM: CT HEAD WITHOUT CONTRAST  TECHNIQUE: Contiguous axial images were obtained from the base of the skull through the vertex without intravenous contrast.  COMPARISON:  None.  FINDINGS: 3.6 x 2.7 cm low-density lesion along the left frontal lobe (series 2/ image 10).  Associated central calcification (series 2/image 11). Exact location of the lesion (intra-axial versus extra-axial) is unclear.  Primary differential considerations include chronic infarct (if intra-axial) or possibly an arachnoid cyst (if extra-axial). Acute/subacute infarct or solid mass are considered less likely.  No evidence of parenchymal hemorrhage or extra-axial fluid collection.  No midline shift.  Basal cisterns are patent.  Cerebral volume is within normal limits. No ventriculomegaly.  The visualized paranasal sinuses are essentially clear. The mastoid air cells are unopacified.  No evidence of calvarial fracture.  IMPRESSION: 3.6 cm low-density lesion along the left frontal lobe, as described above. Primary differential considerations include chronic infarct or arachnoid cyst. MRI brain with/without contrast is suggested for further evaluation.  These results were called by telephone at the time of interpretation on 08/12/2013 at 1:31 PM to [facility] , who verbally acknowledged these results.   Electronically Signed   By:  Julian Hy M.D.   On: 08/12/2013 13:31   Mr Jodene Nam Head Wo Contrast  08/12/2013   CLINICAL DATA:  Stroke.  Numbness.  Abnormal CT  EXAM: MRI HEAD WITHOUT CONTRAST  MRA HEAD WITHOUT CONTRAST  TECHNIQUE: Multiplanar, multiecho pulse sequences of the brain and surrounding structures were obtained without intravenous contrast. Angiographic images of the head were obtained using MRA technique without contrast.  COMPARISON:  CT 08/12/2013  FINDINGS: MRI HEAD FINDINGS  Acute infarct left thalamus measuring approximately 13 mm in diameter. No other areas of acute infarct.  Encephalomalacia left frontal temporal lobe compatible with chronic infarct, as noted on the CT.  Chronic microvascular ischemic change throughout the white matter. Hyperintensity right lateral cerebellum most likely is an area of chronic infarct. This area does not show any restricted diffusion.  MRA HEAD FINDINGS   Atherosclerotic disease in both vertebral arteries which are patent to the basilar. The basilar is patent with mild atherosclerotic disease. Superior cerebellar and posterior cerebral arteries are patent with moderate atherosclerotic disease diffusely.  Cavernous carotid is irregular with mild to moderate stenosis bilaterally. Both anterior cerebral arteries are patent. There is a moderate to severe stenosis in the left A1 segment.  The right middle cerebral artery is patent with mild atherosclerotic disease. Mild atherosclerotic disease in the left middle cerebral artery.  Negative for cerebral aneurysm.  IMPRESSION: Acute infarct left thalamus  Chronic infarct left frontal temporal lobe and throughout the cerebral white matter. Hyperintensity right lateral cerebellum appears to represent a chronic infarct.  Diffuse intracranial atherosclerotic disease without large vessel occlusion.  I discussed the findings by telephone with Zola Button PA   Electronically Signed   By: Franchot Gallo M.D.   On: 08/12/2013 19:02   Mr Brain Wo Contrast  08/12/2013   CLINICAL DATA:  Stroke.  Numbness.  Abnormal CT  EXAM: MRI HEAD WITHOUT CONTRAST  MRA HEAD WITHOUT CONTRAST  TECHNIQUE: Multiplanar, multiecho pulse sequences of the brain and surrounding structures were obtained without intravenous contrast. Angiographic images of the head were obtained using MRA technique without contrast.  COMPARISON:  CT 08/12/2013  FINDINGS: MRI HEAD FINDINGS  Acute infarct left thalamus measuring approximately 13 mm in diameter. No other areas of acute infarct.  Encephalomalacia left frontal temporal lobe compatible with chronic infarct, as noted on the CT.  Chronic microvascular ischemic change throughout the white matter. Hyperintensity right lateral cerebellum most likely is an area of chronic infarct. This area does not show any restricted diffusion.  MRA HEAD FINDINGS  Atherosclerotic disease in both vertebral arteries which are  patent to the basilar. The basilar is patent with mild atherosclerotic disease. Superior cerebellar and posterior cerebral arteries are patent with moderate atherosclerotic disease diffusely.  Cavernous carotid is irregular with mild to moderate stenosis bilaterally. Both anterior cerebral arteries are patent. There is a moderate to severe stenosis in the left A1 segment.  The right middle cerebral artery is patent with mild atherosclerotic disease. Mild atherosclerotic disease in the left middle cerebral artery.  Negative for cerebral aneurysm.  IMPRESSION: Acute infarct left thalamus  Chronic infarct left frontal temporal lobe and throughout the cerebral white matter. Hyperintensity right lateral cerebellum appears to represent a chronic infarct.  Diffuse intracranial atherosclerotic disease without large vessel occlusion.  I discussed the findings by telephone with Zola Button PA   Electronically Signed   By: Franchot Gallo M.D.   On: 08/12/2013 19:02   Dg Chest Port 1 View  08/13/2013   CLINICAL  DATA:  Acute left thalamus stroke  EXAM: PORTABLE CHEST - 1 VIEW  COMPARISON:  None.  FINDINGS: The heart size and mediastinal contours are within normal limits. Both lungs are clear. The visualized skeletal structures are unremarkable.  IMPRESSION: No active disease.   Electronically Signed   By: Daryll Brod M.D.   On: 08/13/2013 02:55    Microbiology: No results found for this or any previous visit (from the past 240 hour(s)).   Labs: Basic Metabolic Panel:  Recent Labs Lab 08/12/13 1300 08/13/13 0630 08/13/13 1320 08/14/13 0740  NA 134* 136*  --  140  K 3.2* 3.1*  --  3.4*  CL 96 98  --  101  CO2 27 23  --  24  GLUCOSE 270* 274*  --  244*  BUN 6 8  --  12  CREATININE 0.74 0.81  --  0.79  CALCIUM 8.9 8.8  --  8.6  MG  --   --  1.7  --    Liver Function Tests:  Recent Labs Lab 08/13/13 0630  AST 14  ALT 17  ALKPHOS 60  BILITOT 0.5  PROT 7.5  ALBUMIN 3.2*   No results found  for this basename: LIPASE, AMYLASE,  in the last 168 hours No results found for this basename: AMMONIA,  in the last 168 hours CBC:  Recent Labs Lab 08/12/13 1300 08/13/13 0630 08/14/13 0740  WBC 7.6 7.6 6.7  NEUTROABS 5.2 5.2  --   HGB 10.2* 10.2* 10.1*  HCT 32.0* 31.3* 31.1*  MCV 73.6* 72.6* 73.2*  PLT 259 267 232   Cardiac Enzymes:  Recent Labs Lab 08/12/13 1300  TROPONINI <0.30   BNP: BNP (last 3 results) No results found for this basename: PROBNP,  in the last 8760 hours CBG:  Recent Labs Lab 08/13/13 0706 08/13/13 1112 08/13/13 1609 08/14/13 0648 08/14/13 1137  GLUCAP 282* 277* 224* 249* 240*       Signed:  Shreshta Medley  [facility] 08/15/2013, 2:39 PM

## 2013-08-14 NOTE — Evaluation (Signed)
Occupational Therapy Evaluation Patient Details Name: Michele [name] MRN: [id number] DOB: 03/13/1971 Today's Date: 08/14/2013 Time: [id number] OT Time Calculation (min): 23 min  OT Assessment / Plan / Recommendation History of present illness Michele [name] is an 43 y.o. female with a history of hypertension and diabetes mellitus who woke up with numbness and tingling involving her left side on the morning of 08/11/2013. Patient is now s/p acute infarct left thalamus.   Clinical Impression   Pt moving well during evaluation. Education provided. Feel pt is safe to d/c home from OT standpoint.     OT Assessment  Patient does not need any further OT services    Follow Up Recommendations  No OT follow up;Supervision - Intermittent    Barriers to Discharge      Equipment Recommendations  3 in 1 bedside comode    Recommendations for Other Services    Frequency       Precautions / Restrictions     Pertinent Vitals/Pain No pain reported when asked at end of session.     ADL  Grooming: Teeth care;Modified independent Where Assessed - Grooming: Unsupported standing;Supported standing Upper Body Dressing: Modified independent Where Assessed - Upper Body Dressing: Unsupported sitting Lower Body Dressing: Modified independent Where Assessed - Lower Body Dressing: Unsupported sit to stand Toilet Transfer: Independent Toilet Transfer Method: Sit to Barist[place]and Toilet Transfer Equipment: Regular height toilet Tub/Shower Transfer: Supervision/safety Tub/Shower Transfer Method: Science writerAmbulating Tub/Shower Transfer Equipment: Other (comment) (practiced stepping over; 3 in 1) Transfers/Ambulation Related to ADLs: Mod I with ambulation; Supervision for tub transfer. Independent with toilet transfer. ADL Comments: Pt feels balance is off compared to normal, so OT recommended sitting on chair for bathing and also sitting to get dressed. Recommended standing in front of chair/bed when pulling  up LB clothing for safety. Told pt to be using right hand during activities. Discussed gross motor activities she can be doing with RUE. Educated on signs/symptoms of stroke and importance of getting help right away. Recommended quitting smoking and also to avoid canned foods as they have increased sodium.  Recommended older daughter being with pt for tub transfer and while bathing for safety. Spoke about having non-skid rug in bathroom.    OT Diagnosis:    OT Problem List:   OT Treatment Interventions:     OT Goals(Current goals can be found in the care plan section)    Visit Information  Last OT Received On: 08/14/13 Assistance Needed: +1 History of Present Illness: Michele [name] is an 43 y.o. female with a history of hypertension and diabetes mellitus who woke up with numbness and tingling involving her left side on the morning of 08/11/2013. Patient is now s/p acute infarct left thalamus.       Prior Functioning     Home Living Family/patient expects to be discharged to:: Private residence Living Arrangements: Children Available Help at Discharge: Family Type of Home: House Home Access: Stairs to enter Secretary/administratorntrance Stairs-Number of Steps: 3 Entrance Stairs-Rails: Can reach both Home Layout: One level Home Equipment: None  Lives With: Family (2 children age 43 and 8415) Prior Function Level of Independence: Independent Communication Communication: No difficulties Dominant Hand: Right         Vision/Perception Vision - History Patient Visual Report: No change from baseline (bright sunlight been bothering her for a little while) Vision - Assessment Vision Assessment: Vision tested Tracking/Visual Pursuits: Able to track stimulus in all quads without difficulty Visual Fields: No apparent deficits   Cognition  Cognition  Arousal/Alertness: Awake/alert Behavior During Therapy: WFL for tasks assessed/performed Overall Cognitive Status: Within Functional Limits for  tasks assessed    Extremity/Trunk Assessment Upper Extremity Assessment Upper Extremity Assessment: RUE deficits/detail RUE Sensation: decreased light touch RUE Coordination: decreased gross motor Lower Extremity Assessment Lower Extremity Assessment: Defer to PT evaluation     Mobility Bed Mobility Overal bed mobility: Independent Transfers Overall transfer level: Independent     Exercise     Balance     End of Session OT - End of Session Activity Tolerance: Patient tolerated treatment well Patient left: in bed;with call bell/phone within reach Nurse Communication: Mobility status  GO     Earlie Raveling OTR/L [phone] 08/14/2013, 12:25 PM

## 2013-08-14 NOTE — Progress Notes (Signed)
[facility]

## 2013-08-14 NOTE — Progress Notes (Signed)
Stroke Team Progress Note  HISTORY Jeanette Haney-McDonald is an 43 y.o. female with a history of hypertension and diabetes mellitus who woke up with numbness and tingling involving her left side on the morning of 08/11/2013. She was last known well at about 12:30 AM earlier that morning when she went to bed. She has no documented history of previous stroke or TIA. She has not been on antiplatelet therapy. CT scan of the head showed acute left thalamic infarction, as well as an old left frontotemporal infarction as well as possible old right cerebellar stroke. NIH stroke score was 1.  Patient was not administerd TPA secondary to delay in arrival. She was admitted for further evaluation and treatment.  SUBJECTIVE No family at bedside. 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  OBJECTIVE Most recent Vital Signs: Filed Vitals:   08/13/13 2216 08/14/13 0215 08/14/13 0649 08/14/13 1000  BP: 180/80 192/90 172/76 185/82  Pulse:  83 87 83  Temp:  97.8 F (36.6 C) 98.2 F (36.8 C) 97.8 F (36.6 C)  TempSrc:  Oral Oral Oral  Resp:  20 20 20   Height:      Weight:      SpO2:  97% 97% 100%   CBG (last 3)   Recent Labs  08/13/13 1112 08/13/13 1609 08/14/13 0648  GLUCAP 277* 224* 249*    IV Fluid Intake:      MEDICATIONS  . amLODipine  10 mg Oral Daily  . aspirin  300 mg Rectal Daily   Or  . aspirin  325 mg Oral Daily  . atorvastatin  20 mg Oral q1800  . enoxaparin (LOVENOX) injection  40 mg Subcutaneous Q24H  . insulin aspart  0-9 Units Subcutaneous TID WC  . metoprolol tartrate  25 mg Oral BID   PRN:  albuterol, hydrALAZINE, senna-docusate  Diet:  Carb Control thin liquids Activity:  OOB  with assistance DVT Prophylaxis:  Lovenox 40 mg sq daily   CLINICALLY SIGNIFICANT STUDIES Basic Metabolic Panel:   Recent Labs Lab 08/13/13 0630 08/13/13 1320 08/14/13 0740  NA 136*  --  140  K 3.1*  --  3.4*  CL 98  --  101   CO2 23  --  24  GLUCOSE 274*  --  244*  BUN 8  --  12  CREATININE 0.81  --  0.79  CALCIUM 8.8  --  8.6  MG  --  1.7  --    Liver Function Tests:   Recent Labs Lab 08/13/13 0630  AST 14  ALT 17  ALKPHOS 60  BILITOT 0.5  PROT 7.5  ALBUMIN 3.2*   CBC:   Recent Labs Lab 08/12/13 1300 08/13/13 0630 08/14/13 0740  WBC 7.6 7.6 6.7  NEUTROABS 5.2 5.2  --   HGB 10.2* 10.2* 10.1*  HCT 32.0* 31.3* 31.1*  MCV 73.6* 72.6* 73.2*  PLT 259 267 232   Coagulation: No results found for this basename: LABPROT, INR,  in the last 168 hours Cardiac Enzymes:   Recent Labs Lab 08/12/13 1300  TROPONINI <0.30   Urinalysis:   Recent Labs Lab 08/12/13 1334  COLORURINE YELLOW  LABSPEC 1.025  PHURINE 7.0  GLUCOSEU >1000*  HGBUR NEGATIVE  BILIRUBINUR NEGATIVE  KETONESUR NEGATIVE  PROTEINUR NEGATIVE  UROBILINOGEN 0.2  NITRITE NEGATIVE  LEUKOCYTESUR NEGATIVE   Lipid Panel    Component Value Date/Time   CHOL 179 08/13/2013 0630  TRIG 141 08/13/2013 0630   HDL 31* 08/13/2013 0630   CHOLHDL 5.8 08/13/2013 0630   VLDL 28 08/13/2013 0630   LDLCALC 120* 08/13/2013 0630   HgbA1C  Lab Results  Component Value Date   HGBA1C 11.7* 08/13/2013    Urine Drug Screen:   No results found for this basename: labopia,  cocainscrnur,  labbenz,  amphetmu,  thcu,  labbarb    Alcohol Level: No results found for this basename: ETH,  in the last 168 hours   CT of the brain  08/12/2013    3.6 cm low-density lesion along the left frontal lobe, as described above. Primary differential considerations include chronic infarct or arachnoid cyst. MRI brain with/without contrast is suggested for further evaluation.  MRI of the brain  08/12/2013    Acute infarct left thalamus  Chronic infarct left frontal temporal lobe and throughout the cerebral white matter. Hyperintensity right lateral cerebellum appears to represent a chronic infarct.   MRA of the brain  08/12/2013     Diffuse intracranial  atherosclerotic disease without large vessel occlusion.  2D Echocardiogram  EF 60-65% with no source of embolus.   Carotid Doppler  No evidence of hemodynamically significant internal carotid artery stenosis. Vertebral artery flow is antegrade.   CXR  08/13/2013   No active disease.   EKG  normal sinus rhythm. For complete results please see formal report.   Therapy Recommendations No PT needs  Physical Exam   Young african american obese lady not in distress.Awake alert. Afebrile. Head is nontraumatic. Neck is supple without bruit. Hearing is normal. Cardiac exam no murmur or gallop. Lungs are clear to auscultation. Distal pulses are well felt. Neurological Exam :   Awake  Alert oriented x 3. Normal speech and language.eye movements full without nystagmus.fundi were not visualized. Vision acuity and fields appear normal. Hearing is normal. Palatal movements are normal. Face symmetric. Tongue midline. Normal strength, tone, reflexes and coordination. Normal sensation but subjective paresthesias right hemibody. Gait deferred.  ASSESSMENT Ms. Deby Haney-McDonald is a 43 y.o. female presenting with new onset right sided numbness. Imaging confirms a left thalamic infarct. Infarct felt to be thrombotic secondary to small vessel disease.  On no antithrombotics prior to admission. Now on aspirin 325 mg orally every day for secondary stroke prevention. Patient with resultant right hemisensory deficit. Workup completed.  hypertension Diabetes, HgbA1c 11.7, goal < 7.0 Hyperlipidemia, LDL 120, on no statin PTA, now on lipitor 20 mg daily, goal LDL < 70 for diabetics Morbid obesity, Body mass index is 42.84 kg/(m^2).  Cigarette smoker  Hospital day # 2  TREATMENT/PLAN  Continue aspirin 325 mg orally every day for secondary stroke prevention.  Continue statin at discharge  Stop smoking, lose weight  No further stroke workup indicated. Patient has a 10-15% risk of having another stroke over  the next year, the highest risk is within 2 weeks of the most recent stroke/TIA (risk of having a stroke following a stroke or TIA is the same). Ongoing risk factor control by Primary Care Physician Stroke Service will sign off. Please call should any needs arise.  Follow up with Dr. Pearlean BrownieSethi, Stroke Clinic, in 2 months.  Annie MainSHARON BIBY, MSN, RN, ANVP-BC, ANP-BC, Lawernce IonGNP-BC [facility] Pager: [phone] 08/14/2013 10:38 AM  I have personally obtained a history, examined the patient, evaluated imaging results, and formulated the assessment and plan of care. I agree with the above.  Delia HeadyPramod Ines Rebel, MD

## 2013-08-16 ENCOUNTER — Telehealth (HOSPITAL_COMMUNITY): Payer: Self-pay

## 2013-08-16 NOTE — ED Notes (Signed)
Pharmacy calling pt stating was supposed to have Rx for Glipizide but pharmacy stating they dont have one.  DC prescriptions reviewed and pt was supposed to have Rx for Glipizide.  Pharmacy given Rx for Glipizide verbally.

## 2013-12-16 ENCOUNTER — Other Ambulatory Visit: Payer: Self-pay

## 2013-12-16 DIAGNOSIS — Z1231 Encounter for screening mammogram for malignant neoplasm of breast: Secondary | ICD-10-CM

## 2014-01-24 ENCOUNTER — Ambulatory Visit
Admission: RE | Admit: 2014-01-24 | Discharge: 2014-01-24 | Disposition: A | Discharge status: Home / Self Care | Payer: 59 | Source: Ambulatory Visit

## 2014-01-24 DIAGNOSIS — Z1231 Encounter for screening mammogram for malignant neoplasm of breast: Secondary | ICD-10-CM

## 2014-12-06 ENCOUNTER — Inpatient Hospital Stay (HOSPITAL_COMMUNITY)
Admission: EM | Admit: 2014-12-06 | Discharge: 2014-12-07 | DRG: 125 | Disposition: A | Discharge status: Home / Self Care | Payer: 59 | Attending: Internal Medicine | Admitting: Internal Medicine

## 2014-12-06 ENCOUNTER — Encounter (HOSPITAL_COMMUNITY): Payer: Self-pay | Admitting: *Deleted

## 2014-12-06 ENCOUNTER — Emergency Department (HOSPITAL_COMMUNITY): Payer: 59

## 2014-12-06 ENCOUNTER — Other Ambulatory Visit (HOSPITAL_COMMUNITY): Payer: Self-pay

## 2014-12-06 DIAGNOSIS — E878 Other disorders of electrolyte and fluid balance, not elsewhere classified: Secondary | ICD-10-CM | POA: Diagnosis present

## 2014-12-06 DIAGNOSIS — H538 Other visual disturbances: Secondary | ICD-10-CM

## 2014-12-06 DIAGNOSIS — Z7982 Long term (current) use of aspirin: Secondary | ICD-10-CM

## 2014-12-06 DIAGNOSIS — H5461 Unqualified visual loss, right eye, normal vision left eye: Principal | ICD-10-CM

## 2014-12-06 DIAGNOSIS — Z8673 Personal history of transient ischemic attack (TIA), and cerebral infarction without residual deficits: Secondary | ICD-10-CM | POA: Diagnosis not present

## 2014-12-06 DIAGNOSIS — F17210 Nicotine dependence, cigarettes, uncomplicated: Secondary | ICD-10-CM | POA: Diagnosis present

## 2014-12-06 DIAGNOSIS — I639 Cerebral infarction, unspecified: Secondary | ICD-10-CM | POA: Diagnosis present

## 2014-12-06 DIAGNOSIS — E876 Hypokalemia: Secondary | ICD-10-CM | POA: Diagnosis present

## 2014-12-06 DIAGNOSIS — Z79899 Other long term (current) drug therapy: Secondary | ICD-10-CM

## 2014-12-06 DIAGNOSIS — I10 Essential (primary) hypertension: Secondary | ICD-10-CM | POA: Diagnosis present

## 2014-12-06 DIAGNOSIS — E871 Hypo-osmolality and hyponatremia: Secondary | ICD-10-CM | POA: Diagnosis present

## 2014-12-06 DIAGNOSIS — Z6841 Body Mass Index (BMI) 40.0 and over, adult: Secondary | ICD-10-CM

## 2014-12-06 DIAGNOSIS — D649 Anemia, unspecified: Secondary | ICD-10-CM | POA: Diagnosis present

## 2014-12-06 DIAGNOSIS — E11649 Type 2 diabetes mellitus with hypoglycemia without coma: Secondary | ICD-10-CM

## 2014-12-06 DIAGNOSIS — I638 Other cerebral infarction: Secondary | ICD-10-CM | POA: Diagnosis not present

## 2014-12-06 DIAGNOSIS — D6489 Other specified anemias: Secondary | ICD-10-CM | POA: Diagnosis not present

## 2014-12-06 DIAGNOSIS — E1165 Type 2 diabetes mellitus with hyperglycemia: Secondary | ICD-10-CM | POA: Diagnosis present

## 2014-12-06 DIAGNOSIS — E785 Hyperlipidemia, unspecified: Secondary | ICD-10-CM | POA: Diagnosis present

## 2014-12-06 DIAGNOSIS — E669 Obesity, unspecified: Secondary | ICD-10-CM | POA: Diagnosis present

## 2014-12-06 HISTORY — DX: Cerebral infarction, unspecified: I63.9

## 2014-12-06 LAB — I-STAT TROPONIN, ED: Troponin i, poc: 0.01 ng/mL (ref 0.00–0.08)

## 2014-12-06 LAB — COMPREHENSIVE METABOLIC PANEL
ALBUMIN: 2.8 g/dL — AB (ref 3.5–5.0)
ALT: 15 U/L (ref 14–54)
AST: 16 U/L (ref 15–41)
Alkaline Phosphatase: 62 U/L (ref 38–126)
Anion gap: 9 (ref 5–15)
BUN: 10 mg/dL (ref 6–20)
CO2: 29 mmol/L (ref 22–32)
Calcium: 9 mg/dL (ref 8.9–10.3)
Chloride: 94 mmol/L — ABNORMAL LOW (ref 101–111)
Creatinine, Ser: 1.11 mg/dL — ABNORMAL HIGH (ref 0.44–1.00)
GFR, EST NON AFRICAN AMERICAN: 60 mL/min — AB (ref >60)
Glucose, Bld: 224 mg/dL — ABNORMAL HIGH (ref 65–99)
POTASSIUM: 2.5 mmol/L — AB (ref 3.5–5.1)
Sodium: 132 mmol/L — ABNORMAL LOW (ref 135–145)
Total Bilirubin: 0.7 mg/dL (ref 0.3–1.2)
Total Protein: 7.3 g/dL (ref 6.5–8.1)

## 2014-12-06 LAB — CBC WITH DIFFERENTIAL/PLATELET
Basophils Absolute: 0 10*3/uL (ref 0.0–0.1)
Basophils Relative: 0 % (ref 0–1)
EOS PCT: 1 % (ref 0–5)
Eosinophils Absolute: 0.1 10*3/uL (ref 0.0–0.7)
HCT: 26.2 % — ABNORMAL LOW (ref 36.0–46.0)
Hemoglobin: 7.9 g/dL — ABNORMAL LOW (ref 12.0–15.0)
LYMPHS PCT: 24 % (ref 12–46)
Lymphs Abs: 2.1 10*3/uL (ref 0.7–4.0)
MCH: 19.6 pg — ABNORMAL LOW (ref 26.0–34.0)
MCHC: 30.2 g/dL (ref 30.0–36.0)
MCV: 65 fL — ABNORMAL LOW (ref 78.0–100.0)
Monocytes Absolute: 0.4 10*3/uL (ref 0.1–1.0)
Monocytes Relative: 4 % (ref 3–12)
Neutro Abs: 6.3 10*3/uL (ref 1.7–7.7)
Neutrophils Relative %: 71 % (ref 43–77)
PLATELETS: 295 10*3/uL (ref 150–400)
RBC: 4.03 MIL/uL (ref 3.87–5.11)
RDW: 17.8 % — ABNORMAL HIGH (ref 11.5–15.5)
WBC: 8.9 10*3/uL (ref 4.0–10.5)

## 2014-12-06 LAB — IRON AND TIBC
Iron: 8 ug/dL — ABNORMAL LOW (ref 28–170)
SATURATION RATIOS: 2 % — AB (ref 10.4–31.8)
TIBC: 475 ug/dL — AB (ref 250–450)
UIBC: 467 ug/dL

## 2014-12-06 LAB — CORTISOL: Cortisol, Plasma: 3.2 ug/dL

## 2014-12-06 LAB — MAGNESIUM: MAGNESIUM: 1.5 mg/dL — AB (ref 1.7–2.4)

## 2014-12-06 LAB — POC OCCULT BLOOD, ED: FECAL OCCULT BLD: NEGATIVE

## 2014-12-06 LAB — VITAMIN B12: Vitamin B-12: 438 pg/mL (ref 180–914)

## 2014-12-06 LAB — FOLATE: Folate: 10 ng/mL (ref >5.9)

## 2014-12-06 LAB — GLUCOSE, CAPILLARY: GLUCOSE-CAPILLARY: 278 mg/dL — AB (ref 65–99)

## 2014-12-06 LAB — FERRITIN: FERRITIN: 3 ng/mL — AB (ref 11–307)

## 2014-12-06 LAB — TSH: TSH: 0.428 u[IU]/mL (ref 0.350–4.500)

## 2014-12-06 MED ORDER — ALBUTEROL SULFATE (2.5 MG/3ML) 0.083% IN NEBU: 2.5000 mg | INHALATION_SOLUTION | RESPIRATORY_TRACT | Status: DC | PRN

## 2014-12-06 MED ORDER — ASPIRIN 325 MG PO TABS
325.0000 mg | ORAL_TABLET | Freq: Every day | ORAL | Status: DC
  Administered 2014-12-07: 325 mg via ORAL
  Filled 2014-12-06: qty 1

## 2014-12-06 MED ORDER — METOPROLOL TARTRATE 25 MG PO TABS
25.0000 mg | ORAL_TABLET | Freq: Two times a day (BID) | ORAL | Status: DC
  Administered 2014-12-06 – 2014-12-07 (×2): 25 mg via ORAL
  Filled 2014-12-06 (×2): qty 1

## 2014-12-06 MED ORDER — INSULIN ASPART 100 UNIT/ML ~~LOC~~ SOLN
0.0000 [IU] | Freq: Every day | SUBCUTANEOUS | Status: DC
  Administered 2014-12-06: 3 [IU] via SUBCUTANEOUS

## 2014-12-06 MED ORDER — SODIUM CHLORIDE 0.9 % IV SOLN: INTRAVENOUS | Status: DC

## 2014-12-06 MED ORDER — ACETAMINOPHEN 650 MG RE SUPP: 650.0000 mg | Freq: Four times a day (QID) | RECTAL | Status: DC | PRN

## 2014-12-06 MED ORDER — LORAZEPAM 2 MG/ML IJ SOLN
2.0000 mg | Freq: Once | INTRAMUSCULAR | Status: AC
  Administered 2014-12-06: 2 mg via INTRAVENOUS
  Filled 2014-12-06: qty 1

## 2014-12-06 MED ORDER — INSULIN ASPART 100 UNIT/ML ~~LOC~~ SOLN
0.0000 [IU] | Freq: Three times a day (TID) | SUBCUTANEOUS | Status: DC
  Administered 2014-12-07: 2 [IU] via SUBCUTANEOUS
  Administered 2014-12-07: 3 [IU] via SUBCUTANEOUS

## 2014-12-06 MED ORDER — ATORVASTATIN CALCIUM 40 MG PO TABS: 40.0000 mg | ORAL_TABLET | Freq: Every day | ORAL | Status: DC

## 2014-12-06 MED ORDER — ADULT MULTIVITAMIN W/MINERALS CH
1.0000 | ORAL_TABLET | Freq: Every day | ORAL | Status: DC
  Administered 2014-12-07: 1 via ORAL
  Filled 2014-12-06 (×2): qty 1

## 2014-12-06 MED ORDER — HYDROCHLOROTHIAZIDE 50 MG PO TABS
50.0000 mg | ORAL_TABLET | Freq: Every day | ORAL | Status: DC
  Administered 2014-12-07: 50 mg via ORAL
  Filled 2014-12-06 (×3): qty 1

## 2014-12-06 MED ORDER — AMLODIPINE BESYLATE 10 MG PO TABS
10.0000 mg | ORAL_TABLET | Freq: Every day | ORAL | Status: DC
  Administered 2014-12-07: 10 mg via ORAL
  Filled 2014-12-06: qty 1

## 2014-12-06 MED ORDER — NICOTINE 14 MG/24HR TD PT24
14.0000 mg | MEDICATED_PATCH | Freq: Every day | TRANSDERMAL | Status: DC
  Administered 2014-12-06 – 2014-12-07 (×2): 14 mg via TRANSDERMAL
  Filled 2014-12-06 (×2): qty 1

## 2014-12-06 MED ORDER — POTASSIUM CHLORIDE CRYS ER 20 MEQ PO TBCR
40.0000 meq | EXTENDED_RELEASE_TABLET | ORAL | Status: AC
  Administered 2014-12-06 (×2): 40 meq via ORAL
  Filled 2014-12-06 (×2): qty 2

## 2014-12-06 MED ORDER — ACETAMINOPHEN 325 MG PO TABS: 650.0000 mg | ORAL_TABLET | Freq: Four times a day (QID) | ORAL | Status: DC | PRN

## 2014-12-06 MED ORDER — TETRACAINE HCL 0.5 % OP SOLN
2.0000 [drp] | Freq: Once | OPHTHALMIC | Status: AC
  Administered 2014-12-06: 2 [drp] via OPHTHALMIC
  Filled 2014-12-06: qty 2

## 2014-12-06 MED ORDER — GADOBENATE DIMEGLUMINE 529 MG/ML IV SOLN
20.0000 mL | Freq: Once | INTRAVENOUS | Status: AC | PRN
  Administered 2014-12-06: 20 mL via INTRAVENOUS

## 2014-12-06 MED ORDER — DIAZEPAM 5 MG/ML IJ SOLN
5.0000 mg | Freq: Once | INTRAMUSCULAR | Status: AC
  Administered 2014-12-06: 5 mg via INTRAVENOUS
  Filled 2014-12-06: qty 2

## 2014-12-06 MED ORDER — LISINOPRIL-HYDROCHLOROTHIAZIDE 20-25 MG PO TABS: 2.0000 | ORAL_TABLET | Freq: Every day | ORAL | Status: DC

## 2014-12-06 MED ORDER — ENOXAPARIN SODIUM 40 MG/0.4ML ~~LOC~~ SOLN: 40.0000 mg | SUBCUTANEOUS | Status: DC

## 2014-12-06 MED ORDER — SODIUM CHLORIDE 0.9 % IJ SOLN
3.0000 mL | Freq: Two times a day (BID) | INTRAMUSCULAR | Status: DC
  Administered 2014-12-06 – 2014-12-07 (×2): 3 mL via INTRAVENOUS

## 2014-12-06 MED ORDER — LISINOPRIL 20 MG PO TABS
40.0000 mg | ORAL_TABLET | Freq: Every day | ORAL | Status: DC
  Administered 2014-12-06 – 2014-12-07 (×2): 40 mg via ORAL
  Filled 2014-12-06 (×2): qty 2

## 2014-12-06 MED ORDER — HYDRALAZINE HCL 20 MG/ML IJ SOLN: 10.0000 mg | INTRAMUSCULAR | Status: DC | PRN

## 2014-12-06 MED ORDER — ONDANSETRON HCL 4 MG/2ML IJ SOLN: 4.0000 mg | Freq: Four times a day (QID) | INTRAMUSCULAR | Status: DC | PRN

## 2014-12-06 MED ORDER — POTASSIUM CHLORIDE 10 MEQ/100ML IV SOLN
10.0000 meq | INTRAVENOUS | Status: DC
  Administered 2014-12-06 (×2): 10 meq via INTRAVENOUS
  Filled 2014-12-06 (×3): qty 100

## 2014-12-06 MED ORDER — ONDANSETRON HCL 4 MG PO TABS: 4.0000 mg | ORAL_TABLET | Freq: Four times a day (QID) | ORAL | Status: DC | PRN

## 2014-12-06 MED ORDER — POTASSIUM CHLORIDE CRYS ER 20 MEQ PO TBCR: 40.0000 meq | EXTENDED_RELEASE_TABLET | ORAL | Status: DC

## 2014-12-06 NOTE — H&P (Signed)
[facility] History and Physical  Michele [name] TKP:[id number] DOB: [birth year] DOA: 12/06/2014  Referring physician:  Shirlyn Goltz PCP:  [facility]   Chief Complaint:  Blurry vision  HPI:  The patient is a 44 y.o. year-old female with history of diabetes, hypertension, previous stroke which resulted in right numbness from her neck to the feet who presented with blurry vision.  The patient was last at her baseline health about a month ago when she developed a stye over her right eye.  That healed about two weeks ago but after that time, she felt like she had a film over her eye and had some mild blurry vision.  Yesterday, however, she developed sudden onset grayness of central vision with preserved peripheral vision.  It became worse this morning with decreased peripheral vision.  She came to the ER for evaluation and since then her vision has improved some.  She denies worsening numbness or any new weakness, slurred speech or confusion.  She denies fevers, chills, cough, vomiting, or recent ills.  She has intermittent diarrhea attributed to lactose intolerance plus dietary indiscretions.  She takes HCTZ and last took HCTZ two days ago.  She denies abnromal bruising, bleeding, tarry or black stools, blood in stools except for mild hemorrhoid bleeding.  She asks to be tested for pregnancy because she has missed her period.    In the emergency department, her vital signs were notable for elevated blood pressure of 182/80. Labs were notable for hemoglobin of 7.9, occult stool negative. Sodium 132, potassium 2.5, creatinine 1.11 with a glucose of 224. Her head CT was unremarkable. She was seen by neurology who recommended MRI of the brain and orbits.  She was given 48mq of IV potassium in the ER.    Review of Systems:  General:  Denies fevers, chills, weight loss or gain HEENT:  Denies changes to hearing, rhinorrhea, sinus congestion, sore throat CV:  Denies  chest pain and palpitations, lower extremity edema.  PULM:  Denies SOB, wheezing, cough.   GI:  Denies nausea, vomiting, constipation earlier this week.   GU:  Denies dysuria, frequency, urgency ENDO:  Denies polyuria, polydipsia.   HEME:  Denies hematemesis, blood in stools, melena, abnormal bruising or bleeding.  LYMPH:  Denies lymphadenopathy.   MSK:  Denies arthralgias, myalgias.   DERM:  Denies skin rash or ulcer.   NEURO:  Denies focal numbness, weakness, slurred speech, confusion, facial droop.  PSYCH:  Denies anxiety and depression.    Past Medical History  Diagnosis Date  . Diabetes mellitus   . Hypertension   . CVA (cerebral infarction)    Past Surgical History  Procedure Laterality Date  . Btl    . Cesarean section    . Knee arthroscopy    . Tubal ligation     Social History:  reports that she has been smoking.  She has never used smokeless tobacco. 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 Cancer Father      Prior to Admission medications   Medication Sig Start Date End Date Taking? Authorizing Provider  albuterol (PROVENTIL HFA;VENTOLIN HFA) 108 (90 BASE) MCG/ACT inhaler Inhale 2 puffs into the lungs every 4 (four) hours as needed for wheezing. 11/14/12   DJanne Napoleon NP  amLODipine (NORVASC) 10 MG tablet Take 1 tablet (10 mg total) by mouth daily. 08/14/13  Geradine Girt, DO  aspirin 325 MG tablet Take 1 tablet (325 mg total) by mouth daily. 08/14/13   Geradine Girt, DO  atorvastatin (LIPITOR) 20 MG tablet Take 1 tablet (20 mg total) by mouth daily at 6 PM. 08/14/13   Geradine Girt, DO  glipiZIDE (GLUCOTROL) 5 MG tablet Take 1 tablet (5 mg total) by mouth daily before breakfast. 08/14/13   Geradine Girt, DO  glucose monitoring kit (FREESTYLE) monitoring kit 1 each by Does not apply route as needed for other. 08/14/13   Geradine Girt, DO  metFORMIN  (GLUCOPHAGE) 1000 MG tablet Take 1,000 mg by mouth 2 (two) times daily with a meal.    Historical Provider, MD  metoprolol tartrate (LOPRESSOR) 25 MG tablet Take 1 tablet (25 mg total) by mouth 2 (two) times daily. 08/14/13   Geradine Girt, DO  PRESCRIPTION MEDICATION Take 1 tablet by mouth daily. Sample product from the doctor's office: Iron with probiotic.    Historical Provider, MD   Physical Exam: Filed Vitals:   12/06/14 1258  BP: 182/80  Pulse: 98  Temp: 98.1 F (36.7 C)  TempSrc: Oral  Resp: 20  Height: 5' (1.524 m)  Weight: 97.977 kg (216 lb)  SpO2: 99%   Body mass index is 42.18 kg/(m^2).   General:  Obese female, NAD  Eyes:  PERRL, anicteric, non-injected.    ENT:  Nares clear.  OP clear, non-erythematous without plaques or exudates.  MMM.  Neck:  Supple without TM or JVD.    Lymph:  No cervical, supraclavicular, or submandibular LAD.  Cardiovascular:  RRR, normal S1, S2, without m/r/g.  2+ pulses, warm extremities  Respiratory:  CTA bilaterally without increased WOB.  Abdomen:  NABS.  Soft, ND/NT.    Skin:  No rashes or focal lesions.  Musculoskeletal:  Normal bulk and tone.  No LE edema.  Psychiatric:  A & O x 4.  Appropriate affect.  Neurologic:  CN 3-12 intact except for decreased visual acuity in the right eye and she reports some persistent but improved loss of central vision.  5/5 strength.  Sensation intact on left side but diminished on right.    Labs on Admission:  Basic Metabolic Panel:  Recent Labs Lab 12/06/14 1346  NA 132*  K 2.5*  CL 94*  CO2 29  GLUCOSE 224*  BUN 10  CREATININE 1.11*  CALCIUM 9.0   Liver Function Tests:  Recent Labs Lab 12/06/14 1346  AST 16  ALT 15  ALKPHOS 62  BILITOT 0.7  PROT 7.3  ALBUMIN 2.8*   No results for input(s): LIPASE, AMYLASE in the last 168 hours. No results for input(s): AMMONIA in the last 168 hours. CBC:  Recent Labs Lab 12/06/14 1346  WBC 8.9  NEUTROABS 6.3  HGB 7.9*  HCT  26.2*  MCV 65.0*  PLT 295   Cardiac Enzymes: No results for input(s): CKTOTAL, CKMB, CKMBINDEX, TROPONINI in the last 168 hours.  BNP (last 3 results) No results for input(s): BNP in the last 8760 hours.  ProBNP (last 3 results) No results for input(s): PROBNP in the last 8760 hours.  CBG: No results for input(s): GLUCAP in the last 168 hours.  Radiological Exams on Admission: Ct Head Wo Contrast  12/06/2014   CLINICAL DATA:  Right vision loss, blurred vision, uncontrolled hypertension.  EXAM: CT HEAD WITHOUT CONTRAST  TECHNIQUE: Contiguous axial images were obtained from the base of the skull through the vertex without intravenous contrast.  COMPARISON:  CT and MRI 08/14/2013  FINDINGS: Area of encephalomalacia again noted within the left frontal lobe related to chronic infarct. Old right cerebellar infarcts noted. No acute intracranial abnormality. Specifically, no hemorrhage, hydrocephalus, mass lesion, acute infarction, or significant intracranial injury. No acute calvarial abnormality. Visualized paranasal sinuses and mastoids clear. Orbital soft tissues unremarkable.  IMPRESSION: No acute intracranial abnormality.   Electronically Signed   By: Rolm Baptise M.D.   On: 12/06/2014 14:34    EKG: NSR with flattened T-waves in the anterolateral leads which is new  Assessment/Plan Active Problems:   * No active hospital problems. *  ---  Blurred vision, now resolving.   -  MRI negative for stroke or evidence of optic neuritis -  Recommend ophthalmology f/u  Microcytic anemia, previously iron deficient -  Occult stool negative -  Iron studies, B12, folate -  TSH -  Repeat hgb in AM  Hyponatremia and hypochloremia and hypokalemia likely secondary to HCTZ use -  Hold HCTZ -  IV and oral potassium -  Check magnesium level -  IVF and repeat BMP in AM  Diabetes mellitus type 2 with hyperglycemia -  A1c 11.7 1.5 years ago -  Repeat A1c -  Hold oral medications -  SSI and HS  insulin  HTN, blood pressures elevated -  Resume home blood pressure medications  CVA last year -  Resume full dose aspirin (wasn't taking at home) -  Resume high dose statin.  PCP told her to stop taking because her cholesterol was normal, but she has had stroke and should be on high dose statin until 75.  Diet:  Diabetic, healthy heart Access:  PIV IVF:  yes Proph:  lovenox  Code Status: full code Family Communication: patient and her husband Disposition Plan: Admit to telemetry  Time spent: 60 min [name] Canterbury [facility] Pager [phone]  If 7PM-7AM, please contact night-coverage [email or web address] Password [facility] 12/06/2014, 4:13 PM

## 2014-12-06 NOTE — ED Notes (Signed)
Patient still in MRI.  

## 2014-12-06 NOTE — ED Notes (Signed)
Patient off the unit for MRI.

## 2014-12-06 NOTE — ED Provider Notes (Signed)
CSN: [id number]     Arrival date & time 12/06/14  1249 History   First MD Initiated Contact with Patient 12/06/14 1305     Chief Complaint  Patient presents with  . Loss of Vision     (Consider location/radiation/quality/duration/timing/severity/associated sxs/prior Treatment) The history is provided by the patient.  Michele [name] is a 44 y.o. female hx of DM, HTN, here with blurry vision. States that she's been having right eyeball revision for the last 2 weeks. Since yesterday, she noticed that she is unable to see out of the right eye. Of note, she states that she can see of her peripheral vision but not in the central vision. Denies any headache or numbness or weakness. Has previous stroke with some residual paresthesias on the right side that is unchanged. She also noticed intermittent clear drainage from the eye.    Past Medical History  Diagnosis Date  . Diabetes mellitus   . Hypertension   . CVA (cerebral infarction)    Past Surgical History  Procedure Laterality Date  . Btl    . Cesarean section    . Knee arthroscopy    . Tubal ligation     Family History  Problem Relation Age of Onset  . Cancer Father    History  Substance Use Topics  . Smoking status: Current Every Day Smoker -- 0.50 packs/day for 16 years  . Smokeless tobacco: Never Used  . Alcohol Use: No   OB History    No data available     Review of Systems  Eyes: Positive for visual disturbance.  All other systems reviewed and are negative.     Allergies  Review of patient's allergies indicates no known allergies.  Home Medications   Prior to Admission medications   Medication Sig Start Date End Date Taking? Authorizing Provider  albuterol (PROVENTIL HFA;VENTOLIN HFA) 108 (90 BASE) MCG/ACT inhaler Inhale 2 puffs into the lungs every 4 (four) hours as needed for wheezing. 11/14/12   Janne Napoleon, NP  amLODipine (NORVASC) 10 MG tablet Take 1 tablet (10 mg total) by mouth daily. 08/14/13    Geradine Girt, DO  aspirin 325 MG tablet Take 1 tablet (325 mg total) by mouth daily. 08/14/13   Geradine Girt, DO  atorvastatin (LIPITOR) 20 MG tablet Take 1 tablet (20 mg total) by mouth daily at 6 PM. 08/14/13   Geradine Girt, DO  glipiZIDE (GLUCOTROL) 5 MG tablet Take 1 tablet (5 mg total) by mouth daily before breakfast. 08/14/13   Geradine Girt, DO  glucose monitoring kit (FREESTYLE) monitoring kit 1 each by Does not apply route as needed for other. 08/14/13   Geradine Girt, DO  metFORMIN (GLUCOPHAGE) 1000 MG tablet Take 1,000 mg by mouth 2 (two) times daily with a meal.    Historical Provider, MD  metoprolol tartrate (LOPRESSOR) 25 MG tablet Take 1 tablet (25 mg total) by mouth 2 (two) times daily. 08/14/13   Geradine Girt, DO  PRESCRIPTION MEDICATION Take 1 tablet by mouth daily. Sample product from the doctor's office: Iron with probiotic.    Historical Provider, MD   BP 182/80 mmHg  Pulse 98  Temp(Src) 98.1 F (36.7 C) (Oral)  Resp 20  Ht 5' (1.524 m)  Wt 216 lb (97.977 kg)  BMI 42.18 kg/m2  SpO2 99%  LMP 10/17/2014 Physical Exam  Constitutional: She is oriented to person, place, and time.  Chronically ill   HENT:  Head: Normocephalic.  Mouth/Throat: Oropharynx is  clear and moist.  Eyes: Conjunctivae and EOM are normal. Pupils are equal, round, and reactive to light.  Pupils equal and reactive. R fundus pale, nl optic disc. Finger count on R eye, vision 20/30 L side.   Neck: Normal range of motion. Neck supple.  Cardiovascular: Normal rate, regular rhythm and normal heart sounds.   Pulmonary/Chest: Effort normal and breath sounds normal. No respiratory distress. She has no wheezes. She has no rales.  Abdominal: Soft. Bowel sounds are normal. She exhibits no distension. There is no tenderness. There is no rebound and no guarding.  Musculoskeletal: Normal range of motion. She exhibits no edema or tenderness.  Neurological: She is alert and oriented to person, place, and time.  No cranial nerve deficit. Coordination normal.  Nl strength throughout. CN 2-12 intact. ? R medial upper visual field cut   Skin: Skin is warm and dry.  Psychiatric: She has a normal mood and affect. Her behavior is normal. Judgment and thought content normal.  Nursing note and vitals reviewed.   ED Course  Procedures (including critical care time) Labs Review Labs Reviewed  CBC WITH DIFFERENTIAL/PLATELET - Abnormal; Notable for the following:    Hemoglobin 7.9 (*)    HCT 26.2 (*)    MCV 65.0 (*)    MCH 19.6 (*)    RDW 17.8 (*)    All other components within normal limits  COMPREHENSIVE METABOLIC PANEL - Abnormal; Notable for the following:    Sodium 132 (*)    Potassium 2.5 (*)    Chloride 94 (*)    Glucose, Bld 224 (*)    Creatinine, Ser 1.11 (*)    Albumin 2.8 (*)    GFR calc non Af Amer 60 (*)    All other components within normal limits  URINE RAPID DRUG SCREEN (HOSP PERFORMED)  I-STAT TROPOININ, ED  POC OCCULT BLOOD, ED    Imaging Review Ct Head Wo Contrast  12/06/2014   CLINICAL DATA:  Right vision loss, blurred vision, uncontrolled hypertension.  EXAM: CT HEAD WITHOUT CONTRAST  TECHNIQUE: Contiguous axial images were obtained from the base of the skull through the vertex without intravenous contrast.  COMPARISON:  CT and MRI 08/14/2013  FINDINGS: Area of encephalomalacia again noted within the left frontal lobe related to chronic infarct. Old right cerebellar infarcts noted. No acute intracranial abnormality. Specifically, no hemorrhage, hydrocephalus, mass lesion, acute infarction, or significant intracranial injury. No acute calvarial abnormality. Visualized paranasal sinuses and mastoids clear. Orbital soft tissues unremarkable.  IMPRESSION: No acute intracranial abnormality.   Electronically Signed   By: Rolm Baptise M.D.   On: 12/06/2014 14:34     EKG Interpretation None      MDM   Final diagnoses:  Blurry vision, right eye   Michele [name] is a 44  y.o. female here with R eye blurry vision. Only able to finger count. Eye pressure nl. Bedside US showed possible retinal detachment. However, has visual field cut so I am concerned for stroke. Patient has refused MRI. Will get CT head. Discussed with Dr. Aram Beecham from neuro, who will see patient.   4:21 PM Dr. Aram Beecham saw patient, able to convince her to get MRI after ativan. However, labs showed Hg 8 with baseline 10. Occ neg, denies vaginal bleeding but has intermittent dark stool. Also has several episodes of diarrhea and potassium 2.5. K supplemented. MRI pending. Will admit for hypokalemia, anemia workup, possible stroke.    Wandra Arthurs, MD 12/06/14 [phone]

## 2014-12-06 NOTE — ED Notes (Signed)
MD at bedside. 

## 2014-12-06 NOTE — ED Notes (Signed)
Pt reports blurred vision x 2 weeks, more severe in right eye. Reports increase in symptoms over past 24-48 hrs and now reports everything "gray" in right eye.

## 2014-12-06 NOTE — Consult Note (Signed)
Referring Physician: Dr Silverio LayYao    Chief Complaint: visual loss right eye  HPI:                                                                                                                                         Michele [name] is an 44 y.o. female with a past medical history that is relevant for HTN, DM type II, lacunar infarct left thalamus 2015 with residual left sided numbness, poor adherence to treatment, comes in for further evaluation of the above stated symptoms. Never had visual problems before. Patient stated that approximately 2 weeks ago she developed gradual onset of blurred vision in her right eye that got getter for a couple of days but then started worsening yesterday and today got to the point that impaired her ability to see. She said that she is able to see the full TV screen " but with patches in my right eye field, random cloudiness in vision and los of vision in spots". Denies associated eye pain, HA, eyelid ptosis, eye trauma, vertigo, double vision, focal weakness, imbalance, slurred speech, language or vision impairment.  CT brain pending.  Date last known well: undetermined Time last known well:  undetermined tPA Given: no, late presentation   Past Medical History  Diagnosis Date  . Diabetes mellitus   . Hypertension     Past Surgical History  Procedure Laterality Date  . Btl    . Cesarean section    . Knee arthroscopy    . Tubal ligation      Family History  Problem Relation Age of Onset  . Cancer Father    Social History:  reports that she has been smoking.  She has never used smokeless tobacco. She reports that she does not drink alcohol or use illicit drugs.  Family history: no MS, brain tumors, or brain aneurysms. Mother had seizures that " went away" 10 year ago.  Allergies: No Known Allergies  Medications:                                                                                                                           I have  reviewed the patient's current medications.  ROS:  History obtained from chart review and the patient  General ROS: negative for - chills, fatigue, fever, night sweats, weight gain or weight loss Psychological ROS: negative for - behavioral disorder, hallucinations, memory difficulties, mood swings or suicidal ideation Ophthalmic ROS: negative for - double vision or eye pain  ENT ROS: negative for - epistaxis, nasal discharge, oral lesions, sore throat, tinnitus or vertigo Allergy and Immunology ROS: negative for - hives or itchy/watery eyes Hematological and Lymphatic ROS: negative for - bleeding problems, bruising or swollen lymph nodes Endocrine ROS: negative for - galactorrhea, hair pattern changes, polydipsia/polyuria or temperature intolerance Respiratory ROS: negative for - cough, hemoptysis, shortness of breath or wheezing Cardiovascular ROS: negative for - chest pain, dyspnea on exertion, edema or irregular heartbeat Gastrointestinal ROS: negative for - abdominal pain, diarrhea, hematemesis, nausea/vomiting or stool incontinence Genito-Urinary ROS: negative for - dysuria, hematuria, incontinence or urinary frequency/urgency Musculoskeletal ROS: negative for - joint swelling or muscular weakness Neurological ROS: as noted in HPI Dermatological ROS: negative for rash and skin lesion changes  Physical exam: pleasant female in no apparent distress. Blood pressure 182/80, pulse 98, temperature 98.1 F (36.7 C), temperature source Oral, resp. rate 20, height 5' (1.524 m), weight 97.977 kg (216 lb), last menstrual period 10/17/2014, SpO2 99 %. Head: normocephalic. Neck: supple, no bruits, no JVD. Cardiac: no murmurs. Lungs: clear. Abdomen: soft, no tender, no mass. Extremities: no edema. Skin: no rash Neurologic Examination:                                                                                                       General: Mental Status: Alert, oriented, thought content appropriate.  Speech fluent without evidence of aphasia.  Able to follow 3 step commands without difficulty. Cranial Nerves: II: Discs flat bilaterally; Visual fields with some impairment right upper quadrant, pupils equal, round, reactive to light and accommodation, no APD III,IV, VI: ptosis not present, extra-ocular motions intact bilaterally V,VII: smile symmetric, facial light touch sensation normal bilaterally VIII: hearing normal bilaterally IX,X: uvula rises symmetrically XI: bilateral shoulder shrug XII: midline tongue extension without atrophy or fasciculations  Motor: Right : Upper extremity   5/5    Left:     Upper extremity   5/5  Lower extremity   5/5     Lower extremity   5/5 Tone and bulk:normal tone throughout; no atrophy noted Sensory: Pinprick and light touch intact throughout, bilaterally Deep Tendon Reflexes:  Right: Upper Extremity   Left: Upper extremity   biceps (C-5 to C-6) 2/4   biceps (C-5 to C-6) 2/4 tricep (C7) 2/4    triceps (C7) 2/4 Brachioradialis (C6) 2/4  Brachioradialis (C6) 2/4  Lower Extremity Lower Extremity  quadriceps (L-2 to L-4) 2/4   quadriceps (L-2 to L-4) 2/4 Achilles (S1) 2/4   Achilles (S1) 2/4  Plantars: Right: downgoing   Left: downgoing Cerebellar: normal finger-to-nose,  normal heel-to-shin test Gait:  No ataxia CV: pulses palpable throughout    Results for orders placed or performed during the hospital encounter of 12/06/14 (from the past 48 hour(s))  I-stat  troponin, ED     Status: None   Collection Time: 12/06/14  1:52 PM  Result Value Ref Range   Troponin i, poc 0.01 0.00 - 0.08 ng/mL   Comment 3            Comment: Due to the release kinetics of cTnI, a negative result within the first hours of the onset of symptoms does not rule out myocardial infarction with certainty. If myocardial  infarction is still suspected, repeat the test at appropriate intervals.    No results found.   Assessment: 44 y.o. female with HTTN, DM, left thalamic infarct, and poor adherence to treatment, comes in with complains of right eye visual loss for the past 2 weeks. Neuro-exam significant only for some impairment visual field right upper quadrant. CT brain pending. Painless visual loss, unlikely to be optic neuritis. Fundi unremarkable and doesn't endorse HA, thus doubt idiopathic intracranial hypertension. Pattern of visual impairment is not consistent with a lesion ion the optic chiasma. She has several risk factors for stroke, therefore can not exclude a left occipital infarct Recommend MRI brain and orbits if CT negative. She is claustrophobic and will require 1 or 2 mg IV ativan before going for MRI.  Wyatt Portela, MD [facility] [phone]  12/06/2014, 2:07 PM

## 2014-12-06 NOTE — Progress Notes (Signed)
Patient admitted from ER. Patient alert and oriented x 4. Patient made comfortable. Family at bedside. Will continue to monitor.

## 2014-12-06 NOTE — ED Notes (Signed)
4N RN advised patient did not received complete Potassium run due to MRI started and paused by pervious RN.

## 2014-12-06 NOTE — ED Notes (Signed)
Pt reports "random" cloudiness in vision and los of vision in spots. Reports recent drainage from eye as well.

## 2014-12-07 DIAGNOSIS — D6489 Other specified anemias: Secondary | ICD-10-CM | POA: Insufficient documentation

## 2014-12-07 DIAGNOSIS — I638 Other cerebral infarction: Secondary | ICD-10-CM

## 2014-12-07 DIAGNOSIS — H538 Other visual disturbances: Secondary | ICD-10-CM

## 2014-12-07 LAB — BASIC METABOLIC PANEL
Anion gap: 9 (ref 5–15)
BUN: 13 mg/dL (ref 6–20)
CHLORIDE: 99 mmol/L — AB (ref 101–111)
CO2: 26 mmol/L (ref 22–32)
Calcium: 8.8 mg/dL — ABNORMAL LOW (ref 8.9–10.3)
Creatinine, Ser: 0.87 mg/dL (ref 0.44–1.00)
GFR calc Af Amer: >60 mL/min (ref >60)
Glucose, Bld: 201 mg/dL — ABNORMAL HIGH (ref 65–99)
POTASSIUM: 3.3 mmol/L — AB (ref 3.5–5.1)
Sodium: 134 mmol/L — ABNORMAL LOW (ref 135–145)

## 2014-12-07 LAB — CBC
HEMATOCRIT: 27.5 % — AB (ref 36.0–46.0)
Hemoglobin: 8 g/dL — ABNORMAL LOW (ref 12.0–15.0)
MCH: 19 pg — ABNORMAL LOW (ref 26.0–34.0)
MCHC: 29.1 g/dL — AB (ref 30.0–36.0)
MCV: 65.2 fL — AB (ref 78.0–100.0)
Platelets: 295 10*3/uL (ref 150–400)
RBC: 4.22 MIL/uL (ref 3.87–5.11)
RDW: 17.9 % — AB (ref 11.5–15.5)
WBC: 7.3 10*3/uL (ref 4.0–10.5)

## 2014-12-07 LAB — GLUCOSE, CAPILLARY
Glucose-Capillary: 196 mg/dL — ABNORMAL HIGH (ref 65–99)
Glucose-Capillary: 235 mg/dL — ABNORMAL HIGH (ref 65–99)

## 2014-12-07 LAB — PREGNANCY, URINE: Preg Test, Ur: NEGATIVE

## 2014-12-07 LAB — OCCULT BLOOD X 1 CARD TO LAB, STOOL: Fecal Occult Bld: NEGATIVE

## 2014-12-07 MED ORDER — MAGNESIUM OXIDE 400 (241.3 MG) MG PO TABS
200.0000 mg | ORAL_TABLET | Freq: Two times a day (BID) | ORAL | Status: DC
  Administered 2014-12-07: 200 mg via ORAL
  Filled 2014-12-07: qty 1

## 2014-12-07 MED ORDER — MAGNESIUM OXIDE 400 (241.3 MG) MG PO TABS: 200.0000 mg | ORAL_TABLET | Freq: Two times a day (BID) | ORAL | Status: DC

## 2014-12-07 MED ORDER — POTASSIUM CHLORIDE CRYS ER 20 MEQ PO TBCR: 40.0000 meq | EXTENDED_RELEASE_TABLET | Freq: Every day | ORAL | Status: DC

## 2014-12-07 MED ORDER — ENSURE ENLIVE PO LIQD: 237.0000 mL | Freq: Two times a day (BID) | ORAL | Status: DC

## 2014-12-07 MED ORDER — POTASSIUM CHLORIDE CRYS ER 20 MEQ PO TBCR
40.0000 meq | EXTENDED_RELEASE_TABLET | Freq: Once | ORAL | Status: AC
  Administered 2014-12-07: 40 meq via ORAL
  Filled 2014-12-07: qty 2

## 2014-12-07 NOTE — Progress Notes (Signed)
Patient is being d/c home. D/c instruction given ad patient verbalized understanding. Patient left unit in a wheelchair with all her belongings. Condition stable.

## 2014-12-07 NOTE — Discharge Summary (Signed)
Physician Discharge Summary  Michele [name] LPF:[id number] DOB: [birth year] DOA: 12/06/2014  PCP: [place] date: 12/06/2014 Discharge date: 12/07/2014  Time spent: 45 minutes  Recommendations for Outpatient Follow-up:  1. Dr.Groat Ophthalmology in 2-3days 2. PCP in 1 week, please ensure compliance with Iron, DM, Weight loss  Discharge Diagnoses:    L eye blurring of vision:   CVA (cerebral infarction)   HTN (hypertension)   Diabetes mellitus   HLD (hyperlipidemia)   Discharge Condition: stable  Diet recommendation: low sodium, diabetic  Filed Weights   12/06/14 1258  Weight: 97.977 kg (216 lb)    History of present illness:  Chief Complaint: Blurry vision HPI: The patient is a 44 y.o. year-old female with history of diabetes, hypertension, previous stroke which resulted in right numbness from her neck to the feet who presented with blurry vision. The patient was last at her baseline health about a month ago when she developed a stye over her right eye. That healed about two weeks ago but after that time, she felt like she had a film over her eye and had some mild blurry vision. Yesterday, however, she developed sudden onset grayness of central vision with preserved peripheral vision. It became worse 5/21 morning with decreased peripheral vision. She came to the ER for evaluation   Hospital Course:  Blurred vision,  -now resolving.  - MRI negative for stroke or evidence of optic neuritis - Neuro recommended ophthalmology f/u -she has seen Dr.Griat in the past, i asked her to FU with him  Microcytic anemia, previously iron deficient - Occult stool negative - Outpatient workup per PCP  Hyponatremia and hypochloremia and hypokalemia likely secondary to HCTZ use - improved, KCL supplemented and prescribed at discharge  Diabetes mellitus type 2 with hyperglycemia - A1c 11.7 1.5 years ago - Repeat pending -  resume homes meds, PCP to manage  HTN, blood pressures elevated - Resumed home blood pressure medications  CVA last year - Resumed full dose aspirin (wasn't taking at home) - Resume high dose statin. PCP told her to stop taking because her cholesterol was normal, but she has had stroke and should be on high dose statin until 75.   Consultations:  Neuro  Discharge Exam: Filed Vitals:   12/07/14 0618  BP: 183/83  Pulse: 94  Temp: 98.4 F (36.9 C)  Resp: 20    General: AAOx3 Cardiovascular: S1S2/RRR Respiratory: CTAB  Discharge Instructions   Discharge Instructions    Diet - low sodium heart healthy    Complete by:  As directed      Diet Carb Modified    Complete by:  As directed      Increase activity slowly    Complete by:  As directed           Current Discharge Medication List    START taking these medications   Details  magnesium oxide (MAG-OX) 400 (241.3 MG) MG tablet Take 0.5 tablets (200 mg total) by mouth 2 (two) times daily. Qty: 30 tablet, Refills: 1    potassium chloride SA (K-DUR,KLOR-CON) 20 MEQ tablet Take 2 tablets (40 mEq total) by mouth daily. Qty: 30 tablet, Refills: 0      CONTINUE these medications which have NOT CHANGED   Details  glipiZIDE (GLUCOTROL XL) 10 MG 24 hr tablet Take 20 mg by mouth daily.    glucose blood test strip Check fasting blood sugar once daily. Dx 250.02    lisinopril-hydrochlorothiazide (PRINZIDE,ZESTORETIC) 20-25 MG  per tablet Take 2 tablets by mouth daily.    lovastatin (MEVACOR) 20 MG tablet Take 20 mg by mouth daily.    albuterol (PROVENTIL HFA;VENTOLIN HFA) 108 (90 BASE) MCG/ACT inhaler Inhale 2 puffs into the lungs every 4 (four) hours as needed for wheezing. Qty: 1 Inhaler, Refills: 0    amLODipine (NORVASC) 10 MG tablet Take 1 tablet (10 mg total) by mouth daily. Qty: 30 tablet, Refills: 0    aspirin 325 MG tablet Take 1 tablet (325 mg total) by mouth daily.    atorvastatin (LIPITOR) 20 MG  tablet Take 1 tablet (20 mg total) by mouth daily at 6 PM. Qty: 30 tablet, Refills: 0    glucose monitoring kit (FREESTYLE) monitoring kit 1 each by Does not apply route as needed for other. Qty: 1 each, Refills: 0    metFORMIN (GLUCOPHAGE) 1000 MG tablet Take 1,000 mg by mouth 2 (two) times daily with a meal.    metoprolol tartrate (LOPRESSOR) 25 MG tablet Take 1 tablet (25 mg total) by mouth 2 (two) times daily. Qty: 60 tablet, Refills: 0    Multiple Vitamins-Minerals (THERA-M) TABS Take 1 tablet by mouth daily.    PRESCRIPTION MEDICATION Take 1 tablet by mouth daily. Sample product from the doctor's office: Iron with probiotic.      STOP taking these medications     glipiZIDE (GLUCOTROL) 5 MG tablet        No Known Allergies Follow-up Information    Follow up with Josephina Gip, MD. Schedule an appointment as soon as possible for a visit in 2 days.   Specialty:  Ophthalmology   Contact information:   [address] [place] [address] [phone]       Follow up with [facility]. Schedule an appointment as soon as possible for a visit in 1 week.   Specialty:  Family Medicine       The results of significant diagnostics from this hospitalization (including imaging, microbiology, ancillary and laboratory) are listed below for reference.    Significant Diagnostic Studies: Ct Head Wo Contrast  12/06/2014   CLINICAL DATA:  Right vision loss, blurred vision, uncontrolled hypertension.  EXAM: CT HEAD WITHOUT CONTRAST  TECHNIQUE: Contiguous axial images were obtained from the base of the skull through the vertex without intravenous contrast.  COMPARISON:  CT and MRI 08/14/2013  FINDINGS: Area of encephalomalacia again noted within the left frontal lobe related to chronic infarct. Old right cerebellar infarcts noted. No acute intracranial abnormality. Specifically, no hemorrhage, hydrocephalus, mass lesion, acute infarction, or  significant intracranial injury. No acute calvarial abnormality. Visualized paranasal sinuses and mastoids clear. Orbital soft tissues unremarkable.  IMPRESSION: No acute intracranial abnormality.   Electronically Signed   By: Rolm Baptise M.D.   On: 12/06/2014 14:34   Mr Jeri Cos ZD Contrast  12/06/2014   CLINICAL DATA:  Blurred vision right eye. History of hypertension, diabetes, stroke CT head 12/06/2014  EXAM: MRI HEAD AND ORBITS WITHOUT AND WITH CONTRAST  TECHNIQUE: Multiplanar, multiecho pulse sequences of the brain and surrounding structures were obtained without and with intravenous contrast. Multiplanar, multiecho pulse sequences of the orbits and surrounding structures were obtained including fat saturation techniques, before and after intravenous contrast administration.  CONTRAST:  27m MULTIHANCE GADOBENATE DIMEGLUMINE 529 MG/ML IV SOLN  COMPARISON:  CT head 12/06/2014, MRI 08/12/2013  FINDINGS: MRI HEAD FINDINGS  Image quality degraded by mild motion  Negative for acute infarct  Chronic infarct  in the left frontal temporal lobe. Chronic infarct in the left thalamus and right posterior putamen. Chronic infarct in the right inferior cerebellum and left inferior cerebellum.  Negative for hemorrhage.  No mass lesion.  Postcontrast imaging reveals enhancement of septations within the left frontal infarct, some which are vascular and others may be due to granulation tissue. No underlying mass lesion.  MRI ORBITS FINDINGS  Image quality degraded by motion.  Optic nerve normal in signal and size. Optic chiasm normal. Pituitary not enlarged. No enhancing lesion within the optic nerve. Small lesions could be missed given the amount of motion.  Extraocular muscles are normal in size and signal. Orbital fat is normal. The globe is normal in shape without focal abnormality.  The lacrimal gland is normal. No orbital mass is seen postcontrast administration  Minimal mucosal edema in the paranasal sinuses without  air-fluid level.  IMPRESSION: Chronic ischemic change.  No acute infarct or mass.  Orbital study is degraded by motion. Allowing for this, no significant abnormality is detected.   Electronically Signed   By: Franchot Gallo M.D.   On: 12/06/2014 17:39   Mr Darnelle Catalan Wo/w Cm  12/06/2014   CLINICAL DATA:  Blurred vision right eye. History of hypertension, diabetes, stroke CT head 12/06/2014  EXAM: MRI HEAD AND ORBITS WITHOUT AND WITH CONTRAST  TECHNIQUE: Multiplanar, multiecho pulse sequences of the brain and surrounding structures were obtained without and with intravenous contrast. Multiplanar, multiecho pulse sequences of the orbits and surrounding structures were obtained including fat saturation techniques, before and after intravenous contrast administration.  CONTRAST:  68m MULTIHANCE GADOBENATE DIMEGLUMINE 529 MG/ML IV SOLN  COMPARISON:  CT head 12/06/2014, MRI 08/12/2013  FINDINGS: MRI HEAD FINDINGS  Image quality degraded by mild motion  Negative for acute infarct  Chronic infarct in the left frontal temporal lobe. Chronic infarct in the left thalamus and right posterior putamen. Chronic infarct in the right inferior cerebellum and left inferior cerebellum.  Negative for hemorrhage.  No mass lesion.  Postcontrast imaging reveals enhancement of septations within the left frontal infarct, some which are vascular and others may be due to granulation tissue. No underlying mass lesion.  MRI ORBITS FINDINGS  Image quality degraded by motion.  Optic nerve normal in signal and size. Optic chiasm normal. Pituitary not enlarged. No enhancing lesion within the optic nerve. Small lesions could be missed given the amount of motion.  Extraocular muscles are normal in size and signal. Orbital fat is normal. The globe is normal in shape without focal abnormality.  The lacrimal gland is normal. No orbital mass is seen postcontrast administration  Minimal mucosal edema in the paranasal sinuses without air-fluid level.   IMPRESSION: Chronic ischemic change.  No acute infarct or mass.  Orbital study is degraded by motion. Allowing for this, no significant abnormality is detected.   Electronically Signed   By: CFranchot GalloM.D.   On: 12/06/2014 17:39    Microbiology: No results found for this or any previous visit (from the past 240 hour(s)).   Labs: Basic Metabolic Panel:  Recent Labs Lab 12/06/14 1346 12/06/14 1903 12/07/14 0412  NA 132*  --  134*  K 2.5*  --  3.3*  CL 94*  --  99*  CO2 29  --  26  GLUCOSE 224*  --  201*  BUN 10  --  13  CREATININE 1.11*  --  0.87  CALCIUM 9.0  --  8.8*  MG  --  1.5*  --  Liver Function Tests:  Recent Labs Lab 12/06/14 1346  AST 16  ALT 15  ALKPHOS 62  BILITOT 0.7  PROT 7.3  ALBUMIN 2.8*   No results for input(s): LIPASE, AMYLASE in the last 168 hours. No results for input(s): AMMONIA in the last 168 hours. CBC:  Recent Labs Lab 12/06/14 1346 12/07/14 0412  WBC 8.9 7.3  NEUTROABS 6.3  --   HGB 7.9* 8.0*  HCT 26.2* 27.5*  MCV 65.0* 65.2*  PLT 295 295   Cardiac Enzymes: No results for input(s): CKTOTAL, CKMB, CKMBINDEX, TROPONINI in the last 168 hours. BNP: BNP (last 3 results) No results for input(s): BNP in the last 8760 hours.  ProBNP (last 3 results) No results for input(s): PROBNP in the last 8760 hours.  CBG:  Recent Labs Lab 12/06/14 2144 12/07/14 0621  GLUCAP 278* 196*       Signed:  Edna Grover  [facility] 12/07/2014, 9:12 AM

## 2014-12-07 NOTE — Progress Notes (Signed)
MRI brain and orbits reviewed and are unremarkable. No need for further inpatient neurological testing. Recommend opthalmology evaluation.  Wyatt Portelasvaldo Camilo, MD

## 2014-12-26 ENCOUNTER — Other Ambulatory Visit: Payer: Self-pay

## 2014-12-26 DIAGNOSIS — Z1231 Encounter for screening mammogram for malignant neoplasm of breast: Secondary | ICD-10-CM

## 2015-01-30 ENCOUNTER — Ambulatory Visit
Admission: RE | Admit: 2015-01-30 | Discharge: 2015-01-30 | Disposition: A | Discharge status: Home / Self Care | Payer: Commercial Managed Care - HMO | Source: Ambulatory Visit

## 2015-01-30 DIAGNOSIS — Z1231 Encounter for screening mammogram for malignant neoplasm of breast: Secondary | ICD-10-CM

## 2015-11-16 ENCOUNTER — Telehealth: Payer: Self-pay | Admitting: *Deleted

## 2015-11-16 ENCOUNTER — Encounter: Payer: Self-pay | Admitting: Neurology

## 2015-11-16 ENCOUNTER — Ambulatory Visit (INDEPENDENT_AMBULATORY_CARE_PROVIDER_SITE_OTHER): Payer: 59 | Admitting: Neurology

## 2015-11-16 VITALS — BP 183/102 | HR 83 | Ht 60.0 in | Wt 212.4 lb

## 2015-11-16 DIAGNOSIS — G40109 Localization-related (focal) (partial) symptomatic epilepsy and epileptic syndromes with simple partial seizures, not intractable, without status epilepticus: Secondary | ICD-10-CM | POA: Diagnosis not present

## 2015-11-16 DIAGNOSIS — R259 Unspecified abnormal involuntary movements: Secondary | ICD-10-CM

## 2015-11-16 MED ORDER — ALPRAZOLAM 0.5 MG PO TABS: ORAL_TABLET | ORAL | Status: DC

## 2015-11-16 NOTE — Patient Instructions (Addendum)
Remember to drink plenty of fluid, eat healthy meals and do not skip any meals. Try to eat protein with a every meal and eat a healthy snack such as fruit or nuts in between meals. Try to keep a regular sleep-wake schedule and try to exercise daily, particularly in the form of walking, 20-30 minutes a day, if you can.   As far as your medications are concerned, I would like to suggest; Should start anti-epilepsy drugs  As far as diagnostic testing: EEG, MRI of the brain, prolonged 3-day home EEG if the office is negative  I would like to see you back after workup, sooner if we need to. Please call us with any interim questions, concerns, problems, updates or refill requests.   Our phone number is [phone]. We also have an after hours call service for urgent matters and there is a physician on-call for urgent questions. For any emergencies you know to call 911 or go to the nearest emergency room

## 2015-11-16 NOTE — Progress Notes (Signed)
[facility]    Provider:  Dr Jaynee Eagles Referring Provider: Dr. Laurance Flatten Primary Care Physician:  Minette Brine  CC:  Abnormal involuntary movements.   HPI:  Michele [name] is a 45 y.o. female here as a referral from [employer]. PMHx multiple strokes (per MRI left frontal temporal lobe. left thalamus and right posterior putamen. right inferior cerebellum and left inferior cerebellum, medical noncompliance, uncontrolled HTN and diabetes in the past.  She is here for random movements in her right hand and arm. She was trying to play candy crush on her phone Sunday and she couldn't control her arm, last Sunday leg started acting funny and she couldn't walk, she just fell over and landed on the couch, These movements started last week and they keep happening. Monday she was getting a hot dog in the drive through and she reached her foot up to stop the car and her foot would not land on the break but her foot was just moving around and she scraped a car. She could not control her foot. Yesterday she was blow drying her daughter's hair and her arm started moving on its own. Left side unaffected. The riht arm just started moving, she felt off balance, when she got up her leg was also affected, she couldn't balance herself. Has happened 3 days last week. It lasts for 2 minutes, no aura, no confusion afterwards. No urination, defectation, biting of tongue. Nothing makes it better or worse, it just stops on its own. No loss of consciousness or awareness, no associated focal neurologic deficits such as slurred speech, headaches, numbness or tingling. She has right-sided deficits with weakness and numbness which is a sequelae from her previous strokes. No inciting events, no head trauma, no new medications, no previous illnesses. No personal history of seizures. Mother has seizure. Mother has lupus and RA. She is under a lot of stress at home. No other focal neurologic deficits.  Reviewed  notes, labs and imaging from outside physicians, which showed: was hospitalized in the ED on 12/06/2014 for worsening of right vision. Patient stated that approximately 2 weeks prior she developed gradual onset of blurred vision in her right eye that got getter for a couple of days but then started worsening to the point that impaired her ability to see. She said that she is able to see the full TV screen " but with patches in my right eye field, random cloudiness in vision and los of vision in spots". Denies associated eye pain, HA, eyelid ptosis, eye trauma, vertigo, double vision, focal weakness, imbalance, slurred speech, language or vision impairment. Neuro-exam significant only for some impairment visual field right upper quadrant. MRI was negative for new evidence of stroke or optic neuritis.   HgbA1c 11.7 a year ago, blood pressure was elevated while inpatient, had hyponatremia and hypochloremia while inpatient due to HCTZ use. She was discharged with follow up with Ophthalmology.   Labs 11/2014: TSH 0.428, B12 438, last HgbA1c 07/2013 11.7.   MRI HEAD FINDINGS 12/06/2014: personally reviewed images and agree with the following:  Image quality degraded by mild motion  Negative for acute infarct  Chronic infarct in the left frontal temporal lobe. Chronic infarct in the left thalamus and right posterior putamen. Chronic infarct in the right inferior cerebellum and left inferior cerebellum.  Negative for hemorrhage. No mass lesion.  Postcontrast imaging reveals enhancement of septations within the left frontal infarct, some which are vascular and others may be due to granulation tissue. No underlying  mass lesion.  MRI ORBITS FINDINGS  Image quality degraded by motion.  Optic nerve normal in signal and size. Optic chiasm normal.Pituitary not enlarged. No enhancing lesion within the optic nerve. Small lesions could be missed given the amount of motion.  Extraocular muscles are  normal in size and signal. Orbital fat is normal. The globe is normal in shape without focal abnormality.  The lacrimal gland is normal. No orbital mass is seen postcontrast administration  Minimal mucosal edema in the paranasal sinuses without air-fluid level.  IMPRESSION: Chronic ischemic change. No acute infarct or mass.  Orbital study is degraded by motion. Allowing for this, no significant abnormality is detected.  Review of Systems: Patient complains of symptoms per HPI as well as the following symptoms: increased thirst, fatigue, diarrhea, cramps, anxiety, snoring, sleepiness. Pertinent negatives per HPI. All others negative.   Social History   Social History  . Marital Status: Married    Spouse Name: N/A  . Number of Children: N/A  . Years of Education: N/A   Occupational History  . Not on file.   Social History Main Topics  . Smoking status: Current Every Day Smoker -- 0.50 packs/day for 16 years  . Smokeless tobacco: Never Used  . Alcohol Use: No  . Drug Use: No  . Sexual Activity: Not on file   Other Topics Concern  . Not on file   Social History Narrative    Family History  Problem Relation Age of Onset  . Cancer Father     Past Medical History  Diagnosis Date  . Diabetes mellitus   . Hypertension   . CVA (cerebral infarction)     right side numbness    Past Surgical History  Procedure Laterality Date  . Btl    . Cesarean section    . Knee arthroscopy    . Tubal ligation      Current Outpatient Prescriptions  Medication Sig Dispense Refill  . albuterol (PROVENTIL HFA;VENTOLIN HFA) 108 (90 BASE) MCG/ACT inhaler Inhale 2 puffs into the lungs every 4 (four) hours as needed for wheezing. (Patient taking differently: Inhale 2 puffs into the lungs every 4 (four) hours as needed (for exercise induced asthma). ) 1 Inhaler 0  . amLODipine (NORVASC) 10 MG tablet Take 1 tablet (10 mg total) by mouth daily. 30 tablet 0  . aspirin 325 MG tablet Take  1 tablet (325 mg total) by mouth daily. (Patient not taking: Reported on 12/07/2014)    . atorvastatin (LIPITOR) 20 MG tablet Take 1 tablet (20 mg total) by mouth daily at 6 PM. (Patient not taking: Reported on 12/07/2014) 30 tablet 0  . glipiZIDE (GLUCOTROL XL) 10 MG 24 hr tablet Take 20 mg by mouth daily.    Marland Kitchen glucose monitoring kit (FREESTYLE) monitoring kit 1 each by Does not apply route as needed for other. 1 each 0  . ibuprofen (ADVIL,MOTRIN) 200 MG tablet Take 800 mg by mouth every 6 (six) hours as needed for cramping.    Marland Kitchen lisinopril-hydrochlorothiazide (PRINZIDE,ZESTORETIC) 20-25 MG per tablet Take 2 tablets by mouth daily.    Marland Kitchen lovastatin (MEVACOR) 20 MG tablet Take 20 mg by mouth daily.    . magnesium oxide (MAG-OX) 400 (241.3 MG) MG tablet Take 0.5 tablets (200 mg total) by mouth 2 (two) times daily. 30 tablet 1  . metFORMIN (GLUCOPHAGE) 1000 MG tablet Take 1,000 mg by mouth daily with breakfast.     . metoprolol tartrate (LOPRESSOR) 25 MG tablet Take 1 tablet (25  mg total) by mouth 2 (two) times daily. (Patient not taking: Reported on 12/07/2014) 60 tablet 0  . potassium chloride SA (K-DUR,KLOR-CON) 20 MEQ tablet Take 2 tablets (40 mEq total) by mouth daily. 30 tablet 0   No current facility-administered medications for this visit.    Allergies as of 11/16/2015  . (No Known Allergies)    Vitals: There were no vitals taken for this visit. Last Weight:  Wt Readings from Last 1 Encounters:  12/06/14 216 lb (97.977 kg)   Last Height:   Ht Readings from Last 1 Encounters:  12/06/14 5' (1.524 m)    Physical exam: Exam: Gen: NAD, conversant, well nourised, obese, well groomed                     CV: RRR, no MRG. No Carotid Bruits. No peripheral edema, warm, nontender Eyes: Conjunctivae clear without exudates or hemorrhage  Neuro: Detailed Neurologic Exam  Speech:    Speech is normal; fluent and spontaneous with normal comprehension.  Cognition:    The patient is oriented  to person, place, and time;     recent and remote memory intact;     language fluent;     normal attention, concentration,     fund of knowledge Cranial Nerves:    The pupils are equal, round, and reactive to light. The fundi are normal and spontaneous venous pulsations are present. Visual fields are full to finger confrontation. Extraocular movements are intact. Trigeminal sensation is intact and the muscles of mastication are normal. The face is symmetric. The palate elevates in the midline. Hearing intact. Voice is normal. Shoulder shrug is normal. The tongue has normal motion without fasciculations.   Coordination:    Normal finger to nose and heel to shin. Normal rapid alternating movements.   Gait:    Heel-toe and tandem gait are normal.   Motor Observation:    No asymmetry, no atrophy, and no involuntary movements noted. Tone:    Normal muscle tone.    Posture:    Posture is normal. normal erect    Strength:    Strength is V/V in the upper and lower limbs.      Sensation: intact to LT     Reflex Exam:  DTR's:    Deep tendon reflexes in the upper and lower extremities are normal bilaterally.   Toes:    The toes are downgoing bilaterally.   Clonus:    Clonus is absent.      Assessment/Plan:   45 y.o. female here as a referral from [employer]. PMHx multiple strokes (per MRI left frontal temporal lobe. left thalamus and right posterior putamen. right inferior cerebellum and left inferior cerebellum, medical noncompliance, uncontrolled HTN and diabetes in the past.  She is here for random movements in her right hand and arm. 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 Discussed management, recommend initiating AED, per patient will hold off AEDs until workup completed.   Patient is unable to drive, operate heavy machinery, perform activities at heights or participate in water activities until 6 months seizure free. Highly encouraged her not to drive.   MRI of  the brain w/wo contrast Routine EEG. If negative, prolonged 3-day EEG Recommend Lamictal.  If she has another event, she should call for treatment initiation.   Discussed Patients with epilepsy have a small risk of sudden unexpected death, a condition referred to as sudden unexpected death in epilepsy (SUDEP). SUDEP is defined specifically as the  sudden, unexpected, witnessed or unwitnessed, nontraumatic and nondrowning death in patients with epilepsy with or without evidence for a seizure, and excluding documented status epilepticus, in which post mortem examination does not reveal a structural or toxicologic cause for death    Sarina Ill, MD  [facility] [address] [place], [place] [address]  Phone [phone] Fax [phone]  CC: Dr. Laurance Flatten

## 2015-11-16 NOTE — Telephone Encounter (Signed)
Michele [name]- Patient would like to speak to you about possible payment options. She states her copay is 50 dollars and she is having [name] hard time paying each time she comes. She asked for you to call her when you are free. Thank you!

## 2015-11-17 ENCOUNTER — Ambulatory Visit (INDEPENDENT_AMBULATORY_CARE_PROVIDER_SITE_OTHER): Payer: 59 | Admitting: Neurology

## 2015-11-17 ENCOUNTER — Telehealth: Payer: Self-pay | Admitting: *Deleted

## 2015-11-17 DIAGNOSIS — R259 Unspecified abnormal involuntary movements: Secondary | ICD-10-CM | POA: Diagnosis not present

## 2015-11-17 LAB — COMPREHENSIVE METABOLIC PANEL
A/G RATIO: 1 — AB (ref 1.2–2.2)
ALK PHOS: 61 IU/L (ref 39–117)
ALT: 12 IU/L (ref 0–32)
AST: 14 IU/L (ref 0–40)
Albumin: 3.9 g/dL (ref 3.5–5.5)
BILIRUBIN TOTAL: 0.3 mg/dL (ref 0.0–1.2)
BUN/Creatinine Ratio: 12 (ref 9–23)
BUN: 12 mg/dL (ref 6–24)
CHLORIDE: 93 mmol/L — AB (ref 96–106)
CO2: 27 mmol/L (ref 18–29)
Calcium: 9.5 mg/dL (ref 8.7–10.2)
Creatinine, Ser: 1.04 mg/dL — ABNORMAL HIGH (ref 0.57–1.00)
GFR calc Af Amer: 76 mL/min/{1.73_m2} (ref >59)
GFR calc non Af Amer: 66 mL/min/{1.73_m2} (ref >59)
GLOBULIN, TOTAL: 4 g/dL (ref 1.5–4.5)
Glucose: 278 mg/dL — ABNORMAL HIGH (ref 65–99)
Potassium: 4 mmol/L (ref 3.5–5.2)
SODIUM: 136 mmol/L (ref 134–144)
Total Protein: 7.9 g/dL (ref 6.0–8.5)

## 2015-11-17 LAB — CBC
Hematocrit: 28.9 % — ABNORMAL LOW (ref 34.0–46.6)
Hemoglobin: 8 g/dL — ABNORMAL LOW (ref 11.1–15.9)
MCH: 19.3 pg — AB (ref 26.6–33.0)
MCHC: 27.7 g/dL — ABNORMAL LOW (ref 31.5–35.7)
MCV: 70 fL — ABNORMAL LOW (ref 79–97)
Platelets: 465 10*3/uL — ABNORMAL HIGH (ref 150–379)
RBC: 4.14 x10E6/uL (ref 3.77–5.28)
RDW: 18.9 % — ABNORMAL HIGH (ref 12.3–15.4)
WBC: 9.5 10*3/uL (ref 3.4–10.8)

## 2015-11-17 NOTE — Procedures (Signed)
    History:  Michele [name] is a 45 year old patient with an episode where she was unable to control her right arm, the patient was unable to walk, and she fell over. At times, her right arm moves on its own. There is no associated confusion with the above events. The patient is being evaluated for these episodes.  This is a routine EEG. No skull defects are noted. Medications include alprazolam, Norvasc, aspirin, glipizide, Motrin, Prinizide, lovastatin, metformin, metoprolol, and potassium supplementation.   EEG classification: Normal awake  Description of the recording: The background rhythms of this recording consists of a fairly well modulated medium amplitude alpha rhythm of 9 Hz that is reactive to eye opening and closure. As the record progresses, the patient appears to remain in the waking state throughout the recording. Photic stimulation and hyperventilation were not performed. At no time during the recording does there appear to be evidence of spike or spike wave discharges or evidence of focal slowing. EKG monitor shows no evidence of cardiac rhythm abnormalities with a heart rate of 90.  Impression: This is a normal EEG recording in the waking state. No evidence of ictal or interictal discharges are seen.

## 2015-11-17 NOTE — Telephone Encounter (Signed)
Attempted to reach patient, no answer, no voicemail

## 2015-11-17 NOTE — Telephone Encounter (Signed)
Per Dr Lucia GaskinsAhern, spoke with patient and informed her that her lab results show she is anemic, glucose is elevated and her platelets are high. Her anemia and elevated glucose seem to be chronic, occuring over the past several lab tests. Her elevated platelets is a new finding. Advised she call her PCP, DRrMoore to FU on these lab results. She verbalized understanding, agreement.

## 2015-11-18 ENCOUNTER — Telehealth: Payer: Self-pay | Admitting: *Deleted

## 2015-11-18 NOTE — Telephone Encounter (Signed)
Angie A in billing stated she spoke to pt.

## 2015-11-18 NOTE — Telephone Encounter (Signed)
-----   Message from Anson FretAntonia B Ahern, MD sent at 11/17/2015  6:37 PM EDT ----- EEG is normal, she needs a 3-day ambulatory EEG please. Thanks.

## 2015-11-18 NOTE — Telephone Encounter (Signed)
Tried calling pt about results. Could not LVM. Will try later. It pt calls back, ok to inform her EEG normal per Dr Lucia GaskinsAhern. We are going to order a 30day in home EEG via [facility] who will come and set her up at her house. They will call her to set up appt.

## 2015-11-19 ENCOUNTER — Encounter: Payer: Self-pay | Admitting: *Deleted

## 2015-11-19 NOTE — Telephone Encounter (Signed)
Called and spoke to pt about normal EEG results per Dr Lucia GaskinsAhern. Advised we sent order to have her do a in home EEG for 3 days via [facility] since EEG was normal. I told her they are going to call and verify her information. She verbalized understanding.

## 2015-11-19 NOTE — Progress Notes (Signed)
Faxed completed form to [facility] to schedule pt for in-home 72-hour EEG. Fax: [phone]. Received confirmation.   

## 2015-11-19 NOTE — Progress Notes (Signed)
Received physician status notification from [facility] that they received referral and they will contact us once pt is scheduled for 72hr AMG EEG.   

## 2015-11-20 ENCOUNTER — Ambulatory Visit (INDEPENDENT_AMBULATORY_CARE_PROVIDER_SITE_OTHER): Payer: Self-pay

## 2015-11-20 DIAGNOSIS — R259 Unspecified abnormal involuntary movements: Secondary | ICD-10-CM | POA: Diagnosis not present

## 2015-11-20 DIAGNOSIS — Z0289 Encounter for other administrative examinations: Secondary | ICD-10-CM

## 2015-11-24 ENCOUNTER — Telehealth: Payer: Self-pay | Admitting: Neurology

## 2015-11-24 NOTE — Telephone Encounter (Signed)
Michele [name], can you schedule patient for an appointment on the 16th at noon please? She needs further workup based on the MRI of the brain as I discussed it with her tonight.  Also, she has not been contacted about the home EEG, can you follow up on that did we send it in? thanks  This MRI of the brain with and without contrast shows the following: 1. Enhancing foci in the posterior left frontal lobe involving gray and white matter, not present on diffusion-weighted images. This is nonspecific and could represent vasculitis, neurosarcoid, other inflammation or subacute ischemic changes. These 2 foci were not present on MRI 12/06/2014. 2. Chronic moderate sized left frontal stroke, chronic left thalamic lacunar infarction, chronic ischemic foci in the posterior right putamen and cerebellar hemispheres and T2/flair hyperintense foci elsewhere in the hemispheres consistent with chronic microvascular ischemic change. These ischemic changes appear to be essentially unchanged when compared to the prior MRI.

## 2015-11-25 NOTE — Telephone Encounter (Signed)
Please let patient know.  thanks.

## 2015-11-25 NOTE — Telephone Encounter (Signed)
Scheduled f/u on 5/16 at 12pm per Dr Lucia GaskinsAhern request.

## 2015-11-25 NOTE — Telephone Encounter (Signed)
LVM for [facility] to check on where they are in the process of scheduling pt for 72-hr AMB EEG. Advised I received confirmation on 11/19/15 that they received referral and would contact our office once pt was scheduled, but pt has not heard anything yet. Asked for them to call back and let me know. Gave [facility] phone number.

## 2015-11-25 NOTE — Telephone Encounter (Signed)
[facility]  , Marlowe AltCaroline,  Called to let the nurse know pt is still in the insurance phase. Once it has been cleared they will call the pt to set up appt.  Phone: [phone]

## 2015-11-25 NOTE — Telephone Encounter (Signed)
Dr Ahern- FYI 

## 2015-11-25 NOTE — Telephone Encounter (Signed)
Called pt and relayed they are still verifying her insurance at [facility] and will call her soon to schedule 72hr AMB EEG. Verified I scheduled f/u per Dr Lucia Gaskinsahern request for 5/16 at 12pm, check in 1145am. She verbalized understanding.

## 2015-11-26 ENCOUNTER — Other Ambulatory Visit: Payer: Self-pay | Admitting: Neurology

## 2015-11-26 DIAGNOSIS — I63412 Cerebral infarction due to embolism of left middle cerebral artery: Secondary | ICD-10-CM

## 2015-11-26 DIAGNOSIS — I677 Cerebral arteritis, not elsewhere classified: Secondary | ICD-10-CM

## 2015-11-26 NOTE — Telephone Encounter (Signed)
Michele [name]/[employer] [phone] called said the facility is out of network . She is requesting a return call from [employer]

## 2015-11-26 NOTE — Telephone Encounter (Signed)
Called Michele [name] back. She stated she is working with [facility] right now to see if it can be approved or not. They will update us one they know more.

## 2015-12-01 ENCOUNTER — Encounter: Payer: Self-pay | Admitting: Neurology

## 2015-12-01 ENCOUNTER — Ambulatory Visit (INDEPENDENT_AMBULATORY_CARE_PROVIDER_SITE_OTHER): Payer: 59 | Admitting: Neurology

## 2015-12-01 VITALS — BP 165/97 | HR 85 | Ht 60.0 in | Wt 215.0 lb

## 2015-12-01 DIAGNOSIS — I776 Arteritis, unspecified: Secondary | ICD-10-CM

## 2015-12-01 DIAGNOSIS — I6930 Unspecified sequelae of cerebral infarction: Secondary | ICD-10-CM

## 2015-12-01 DIAGNOSIS — IMO0002 Reserved for concepts with insufficient information to code with codable children: Secondary | ICD-10-CM

## 2015-12-01 DIAGNOSIS — R309 Painful micturition, unspecified: Secondary | ICD-10-CM

## 2015-12-01 DIAGNOSIS — D6859 Other primary thrombophilia: Secondary | ICD-10-CM

## 2015-12-01 DIAGNOSIS — I6319 Cerebral infarction due to embolism of other precerebral artery: Secondary | ICD-10-CM

## 2015-12-01 MED ORDER — LEVETIRACETAM ER 750 MG PO TB24: 750.0000 mg | ORAL_TABLET | Freq: Two times a day (BID) | ORAL | Status: DC

## 2015-12-01 MED ORDER — ATORVASTATIN CALCIUM 10 MG PO TABS: 10.0000 mg | ORAL_TABLET | Freq: Every day | ORAL | Status: DC

## 2015-12-01 NOTE — Patient Instructions (Signed)
Overall you are doing fairly well but I do want to suggest a few things today:   Remember to drink plenty of fluid, eat healthy meals and do not skip any meals. Try to eat protein with a every meal and eat a healthy snack such as fruit or nuts in between meals. Try to keep a regular sleep-wake schedule and try to exercise daily, particularly in the form of walking, 20-30 minutes a day, if you can.   As far as your medications are concerned, I would like to suggest: Keppra twice daily Lipitor at night Aspiring 325mg  daily  As far as diagnostic testing:  Imaging of the blood vessels of the head Labwork today Echocardiogram of the heart and then possible further tests  I would like to see you back in 3 months, sooner if we need to. Please call us with any interim questions, concerns, problems, updates or refill requests.   Our phone number is [phone]. We also have an after hours call service for urgent matters and there is a physician on-call for urgent questions. For any emergencies you know to call 911 or go to the nearest emergency room

## 2015-12-01 NOTE — Progress Notes (Addendum)
[facility]    Provider:  Dr Ahern Referring Provider: [name], [name], [name] Primary Care Physician:  [name] [name] Provider: Dr Ahern Referring Provider: Dr. [name] Primary Care Physician: [name] [name]  CC: Abnormal involuntary movements.   Interval history 12/01/2015: PMHx multiple strokes (per MRI left frontal temporal lobe. left thalamus and right posterior putamen. right inferior cerebellum and left inferior cerebellum, medical noncompliance, uncontrolled HTN and diabetes in the past. She is here for random movements in her right hand and arm. Patient is here for follow-up, she was seen on last appointment and MRI of the brain was ordered. She is here to follow up for both. A she reports she has been noncompliant with her aspirin since her last stroke, she only started taking her aspirin a few days ago. Discussed compliance given that her MRI shows multiple strokes in multiple vascular distributions. We probably need to discuss an embolic source, vasculitis, hypercoagulable state or other causes of her multiple strokes. On this most recent MRI done, 2 new subacute strokes that appear embolic were seen. We'll perform a thorough workup. She continues to have episodes. She has been non compliant with all her medications. She is now taking aspirin 325mg daily.  Had lipid completed at [facility], will call for LDL may need to start a statin. Also discussed starting antiepileptic medication and she has many foci in the brain due to previous strokes that could cause abnormalities such as seizures.   MRI BRAIN 11/20/2015: This MRI of the brain with and without contrast shows the following: 1. Enhancing foci in the posterior left frontal lobe involving gray and white matter, not present on diffusion-weighted images. This is nonspecific and could represent vasculitis, neurosarcoid, other inflammation or subacute ischemic changes. These 2 foci were not present on  MRI 12/06/2014. 2. Chronic moderate sized left frontal stroke, chronic left thalamic lacunar infarction, chronic ischemic foci in the posterior right putamen and cerebellar hemispheres and T2/flair hyperintense foci elsewhere in the hemispheres consistent with chronic microvascular ischemic change. These ischemic changes appear to be essentially unchanged when compared to the prior MRI.  HPI: [name] [name] is a 44 y.o. female here as a referral from [name] [name]. PMHx multiple strokes (per MRI left frontal temporal lobe. left thalamus and right posterior putamen. right inferior cerebellum and left inferior cerebellum, medical noncompliance, uncontrolled HTN and diabetes in the past. She is here for random movements in her right hand and arm. She was trying to play candy crush on her phone Sunday and she couldn't control her arm, last Sunday leg started acting funny and she couldn't walk, she just fell over and landed on the couch, These movements started last week and they keep happening. Monday she was getting a hot dog in the drive through and she reached her foot up to stop the car and her foot would not land on the break but her foot was just moving around and she scraped a car. She could not control her foot. Yesterday she was blow drying her daughter's hair and her arm started moving on its own. Left side unaffected. The riht arm just started moving, she felt off balance, when she got up her leg was also affected, she couldn't balance herself. Has happened 3 days last week. It lasts for 2 minutes, no aura, no confusion afterwards. No urination, defectation, biting of tongue. Nothing makes it better or worse, it just stops on its own. No loss of consciousness or awareness, no associated focal neurologic deficits   such as slurred speech, headaches, numbness or tingling. She has right-sided deficits with weakness and numbness which is a sequelae from her previous strokes. No inciting events, no  head trauma, no new medications, no previous illnesses. No personal history of seizures. Mother has seizure. Mother has lupus and RA. She is under a lot of stress at home. No other focal neurologic deficits.  Reviewed notes, labs and imaging from outside physicians, which showed: was hospitalized in the ED on 12/06/2014 for worsening of right vision. Patient stated that approximately 2 weeks prior she developed gradual onset of blurred vision in her right eye that got getter for a couple of days but then started worsening to the point that impaired her ability to see. She said that she is able to see the full TV screen " but with patches in my right eye field, random cloudiness in vision and los of vision in spots". Denies associated eye pain, HA, eyelid ptosis, eye trauma, vertigo, double vision, focal weakness, imbalance, slurred speech, language or vision impairment. Neuro-exam significant only for some impairment visual field right upper quadrant. MRI was negative for new evidence of stroke or optic neuritis.   HgbA1c 11.7 a year ago, blood pressure was elevated while inpatient, had hyponatremia and hypochloremia while inpatient due to HCTZ use. She was discharged with follow up with Ophthalmology.   Labs 11/2014: TSH 0.428, B12 438, last HgbA1c 07/2013 11.7.   MRI HEAD FINDINGS 12/06/2014: personally reviewed images and agree with the following:  Image quality degraded by mild motion  Negative for acute infarct  Chronic infarct in the left frontal temporal lobe. Chronic infarct in the left thalamus and right posterior putamen. Chronic infarct in the right inferior cerebellum and left inferior cerebellum.  Negative for hemorrhage. No mass lesion.  Postcontrast imaging reveals enhancement of septations within the left frontal infarct, some which are vascular and others may be due to granulation tissue. No underlying mass lesion.  MRI ORBITS FINDINGS  Image quality degraded by  motion.  Optic nerve normal in signal and size. Optic chiasm normal.Pituitary not enlarged. No enhancing lesion within the optic nerve. Small lesions could be missed given the amount of motion.  Extraocular muscles are normal in size and signal. Orbital fat is normal. The globe is normal in shape without focal abnormality.  The lacrimal gland is normal. No orbital mass is seen postcontrast administration  Minimal mucosal edema in the paranasal sinuses without air-fluid level.  IMPRESSION: Chronic ischemic change. No acute infarct or mass.  Orbital study is degraded by motion. Allowing for this, no significant abnormality is detected.  Review of Systems: Patient complains of symptoms per HPI as well as the following symptoms: increased thirst, fatigue, diarrhea, cramps, anxiety, snoring, sleepiness. Pertinent negatives per HPI. All others negative.  Social History   Social History  . Marital Status: Married    Spouse Name: Derrick  . Number of Children: N/A  . Years of Education: 16   Occupational History  . [employer]    Social History Main Topics  . Smoking status: Current Every Day Smoker -- 0.50 packs/day for 16 years  . Smokeless tobacco: Never Used  . Alcohol Use: No  . Drug Use: No  . Sexual Activity: Not on file   Other Topics Concern  . Not on file   Social History Narrative   Lives with husband and kids      Caffeine use:     Family History  Problem Relation Age of   Onset  . Cancer Father   . Seizures Neg Hx     Past Medical History  Diagnosis Date  . Diabetes mellitus   . Hypertension   . CVA (cerebral infarction)     right side numbness    Past Surgical History  Procedure Laterality Date  . Btl    . Cesarean section    . Knee arthroscopy    . Tubal ligation      Current Outpatient Prescriptions  Medication Sig Dispense Refill  . ALPRAZolam (XANAX) 0.5 MG tablet Take 1- 2 tabs 30 minutes before MRi may take an  additional. Do not drive. 5 tablet 0  . amLODipine (NORVASC) 10 MG tablet Take 1 tablet (10 mg total) by mouth daily. 30 tablet 0  . aspirin 325 MG tablet Take 1 tablet (325 mg total) by mouth daily.    . glipiZIDE (GLUCOTROL XL) 10 MG 24 hr tablet Take 20 mg by mouth daily.    . glucose monitoring kit (FREESTYLE) monitoring kit 1 each by Does not apply route as needed for other. 1 each 0  . ibuprofen (ADVIL,MOTRIN) 200 MG tablet Take 800 mg by mouth every 6 (six) hours as needed for cramping.    . lisinopril-hydrochlorothiazide (PRINZIDE,ZESTORETIC) 20-25 MG per tablet Take 2 tablets by mouth daily.    . lovastatin (MEVACOR) 20 MG tablet Take 20 mg by mouth daily.    . metFORMIN (GLUCOPHAGE) 1000 MG tablet Take 1,000 mg by mouth daily with breakfast.     . metoprolol tartrate (LOPRESSOR) 25 MG tablet Take 1 tablet (25 mg total) by mouth 2 (two) times daily. 60 tablet 0  . potassium chloride SA (K-DUR,KLOR-CON) 20 MEQ tablet Take 2 tablets (40 mEq total) by mouth daily. 30 tablet 0   No current facility-administered medications for this visit.    Allergies as of 12/01/2015  . (No Known Allergies)    Vitals: There were no vitals taken for this visit. Last Weight:  Wt Readings from Last 1 Encounters:  11/20/15 216 lb (97.977 kg)   Last Height:   Ht Readings from Last 1 Encounters:  11/16/15 5' (1.524 m)     Physical exam: Exam: Gen: NAD, conversant, well nourised, obese, well groomed  CV: RRR, no MRG. No Carotid Bruits. No peripheral edema, warm, nontender Eyes: Conjunctivae clear without exudates or hemorrhage  Neuro: Detailed Neurologic Exam  Speech:  Speech is normal; fluent and spontaneous with normal comprehension.  Cognition:  The patient is oriented to person, place, and time;   recent and remote memory intact;   language fluent;   normal attention, concentration,   fund of knowledge Cranial Nerves:  The pupils are equal,  round, and reactive to light. The fundi are normal and spontaneous venous pulsations are present. Visual fields are full to finger confrontation. Extraocular movements are intact. Trigeminal sensation is intact and the muscles of mastication are normal. The face is symmetric. The palate elevates in the midline. Hearing intact. Voice is normal. Shoulder shrug is normal. The tongue has normal motion without fasciculations.   Coordination:  Normal finger to nose and heel to shin. Normal rapid alternating movements.   Gait:  Heel-toe and tandem gait are normal.   Motor Observation:  No asymmetry, no atrophy, and no involuntary movements noted. Tone:  Normal muscle tone.   Posture:  Posture is normal. normal erect   Strength:  Strength is V/V in the upper and lower limbs.    Sensation: intact to LT     Reflex Exam:  DTR's:  Deep tendon reflexes in the upper and lower extremities are normal bilaterally.  Toes:  The toes are downgoing bilaterally.  Clonus:  Clonus is absent.   Assessment/Plan: 44 y.o. female here as a referral from [employer]. PMHx multiple strokes (per MRI left frontal temporal lobe. left thalamus and right posterior putamen. right inferior cerebellum and left inferior cerebellum), medical noncompliance, uncontrolled HTN and diabetes in the past. Most recent MRI shows left probable subacute infarcts that appear embolic. She is here for random movements in her right hand and arm that continue. 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   -Patient has had multiple strokes in multiple vascular territories. Need to workup for causes of this including embolic, vasculitic hypercoagulable and others. Workup will include imaging of the blood vessels in her head and neck, extensive labs, echocardiogram, likely TEE and Loop reporter. - Highly encouraged her to be compliant with aspirin 325 mg for stroke prevention. Take daily. We'll also  start a statin. -Will start an AED, Keppra.  - Patient is unable to drive, operate heavy machinery, perform activities at heights or participate in water activities until 6 months seizure free. Highly encouraged her not to drive.  - Discussed Patients with epilepsy have a small risk of sudden unexpected death, a condition referred to as sudden unexpected death in epilepsy (SUDEP). SUDEP is defined specifically as the sudden, unexpected, witnessed or unwitnessed, nontraumatic and nondrowning death in patients with epilepsy with or without evidence for a seizure, and excluding documented status epilepticus, in which post mortem examination does not reveal a structural or toxicologic cause for death  Addendum: Labs from pcp 10/2015: ldl 111 (goal <70). HgbA1c 10.8 (uncontrolled goal < 7), cbc/cmp unremarkable creatinine0.92  Sarina Ill, MD  [facility] [address] [place], [place] [address]  Phone [phone] Fax [phone]  A total of 45 minutes was spent face-to-face with this patient. Over half this time was spent on counseling patient on the embolic stroke, partial seizures diagnosis and different diagnostic and therapeutic options available.

## 2015-12-02 ENCOUNTER — Other Ambulatory Visit: Payer: Self-pay | Admitting: Neurology

## 2015-12-02 ENCOUNTER — Telehealth: Payer: Self-pay | Admitting: *Deleted

## 2015-12-02 LAB — URINALYSIS, ROUTINE W REFLEX MICROSCOPIC
BILIRUBIN UA: NEGATIVE
KETONES UA: NEGATIVE
Leukocytes, UA: NEGATIVE
NITRITE UA: NEGATIVE
SPEC GRAV UA: 1.025 (ref 1.005–1.030)
UUROB: 0.2 mg/dL (ref 0.2–1.0)
pH, UA: 6 (ref 5.0–7.5)

## 2015-12-02 LAB — MICROSCOPIC EXAMINATION: CASTS: NONE SEEN /LPF

## 2015-12-02 LAB — SPECIMEN STATUS REPORT

## 2015-12-02 MED ORDER — LEVETIRACETAM 750 MG PO TABS: 750.0000 mg | ORAL_TABLET | Freq: Two times a day (BID) | ORAL | Status: DC

## 2015-12-02 MED ORDER — LEVETIRACETAM ER 750 MG PO TB24: 750.0000 mg | ORAL_TABLET | Freq: Two times a day (BID) | ORAL | Status: DC

## 2015-12-02 NOTE — Telephone Encounter (Signed)
Called and spoke to pt about results per Dr Lucia GaskinsAhern note. She verbalized understanding and stated she started to drink a lot water yesterday and she feels a lot better. She also stated her copay for [facility]. She wanted me to [facility] and [facility]. She states she has a prescription coupon she can potentially use. She is going to call her insurance company to find out what seizure medications are considered tier 1. Keppra is tier 3 for her. She is going to call back and wants to know out of the tier 1 options, if Dr Lucia GaskinsAhern is comfortable switching her to one of those. I advised I will await her return call. She verbalized understanding.

## 2015-12-02 NOTE — Telephone Encounter (Signed)
Pt called back said the Keppra XR is $35 and standard release is $10 co-pay. She is requesting the standard release with the [facility]. Pt is requesting a call back for status of this change.

## 2015-12-02 NOTE — Telephone Encounter (Signed)
-----   Message from Anson FretAntonia B Ahern, MD sent at 12/02/2015  8:36 AM EDT ----- Urinalysis was negative for infection, she needs to follow up with primary care for evaluation of her pain thanks

## 2015-12-02 NOTE — Telephone Encounter (Signed)
Dr Ahern- please advise, thank you 

## 2015-12-02 NOTE — Telephone Encounter (Signed)
Done. thanks

## 2015-12-02 NOTE — Telephone Encounter (Signed)
Called pt. Advised Dr Lucia GaskinsAhern sent rx keppra 750mg  2x/day instead to requested pharmacy. She verbalized understanding.

## 2015-12-03 ENCOUNTER — Telehealth: Payer: Self-pay | Admitting: *Deleted

## 2015-12-03 LAB — CBC WITH DIFFERENTIAL
BASOS: 1 %
Basophils Absolute: 0 10*3/uL (ref 0.0–0.2)
EOS (ABSOLUTE): 0.1 10*3/uL (ref 0.0–0.4)
EOS: 1 %
HEMATOCRIT: 25.1 % — AB (ref 34.0–46.6)
Hemoglobin: 7.3 g/dL — ABNORMAL LOW (ref 11.1–15.9)
IMMATURE GRANS (ABS): 0 10*3/uL (ref 0.0–0.1)
IMMATURE GRANULOCYTES: 0 %
LYMPHS: 34 %
Lymphocytes Absolute: 2.3 10*3/uL (ref 0.7–3.1)
MCH: 19.8 pg — ABNORMAL LOW (ref 26.6–33.0)
MCHC: 29.1 g/dL — ABNORMAL LOW (ref 31.5–35.7)
MCV: 68 fL — AB (ref 79–97)
Monocytes Absolute: 0.3 10*3/uL (ref 0.1–0.9)
Monocytes: 5 %
NEUTROS PCT: 59 %
Neutrophils Absolute: 4 10*3/uL (ref 1.4–7.0)
RBC: 3.69 x10E6/uL — AB (ref 3.77–5.28)
RDW: 19.1 % — ABNORMAL HIGH (ref 12.3–15.4)
WBC: 6.6 10*3/uL (ref 3.4–10.8)

## 2015-12-03 LAB — CMP AND LIVER
ALT: 12 IU/L (ref 0–32)
AST: 14 IU/L (ref 0–40)
Albumin: 3.9 g/dL (ref 3.5–5.5)
Alkaline Phosphatase: 57 IU/L (ref 39–117)
BILIRUBIN, DIRECT: 0.1 mg/dL (ref 0.00–0.40)
BUN: 11 mg/dL (ref 6–24)
Bilirubin Total: 0.3 mg/dL (ref 0.0–1.2)
CALCIUM: 9.1 mg/dL (ref 8.7–10.2)
CO2: 26 mmol/L (ref 18–29)
CREATININE: 0.89 mg/dL (ref 0.57–1.00)
Chloride: 96 mmol/L (ref 96–106)
GFR calc non Af Amer: 79 mL/min/{1.73_m2} (ref >59)
GFR, EST AFRICAN AMERICAN: 91 mL/min/{1.73_m2} (ref >59)
Glucose: 199 mg/dL — ABNORMAL HIGH (ref 65–99)
Potassium: 3.4 mmol/L — ABNORMAL LOW (ref 3.5–5.2)
SODIUM: 136 mmol/L (ref 134–144)
TOTAL PROTEIN: 7.4 g/dL (ref 6.0–8.5)

## 2015-12-03 LAB — LUPUS ANTICOAGULANT
DPT: 44 s (ref 0.0–55.0)
Dilute Viper Venom Time: 42.2 s (ref 0.0–47.0)
PTT Lupus Anticoagulant: 29 s (ref 0.0–43.6)
Thrombin Time: 15.9 s (ref 0.0–20.9)
dPT Confirm Ratio: 1.22 Ratio (ref 0.00–1.40)

## 2015-12-03 LAB — PAN-ANCA
C-ANCA: <1:20 {titer}
Myeloperoxidase Ab: <9 U/mL (ref 0.0–9.0)
P-ANCA: <1:20 {titer}

## 2015-12-03 LAB — ANA COMPREHENSIVE PANEL
DSDNA AB: 2 [IU]/mL (ref 0–9)
ENA RNP Ab: 0.2 AI (ref 0.0–0.9)
ENA SM Ab Ser-aCnc: <0.2 AI (ref 0.0–0.9)
ENA SSA (RO) Ab: 7.7 AI — ABNORMAL HIGH (ref 0.0–0.9)
ENA SSB (LA) Ab: <0.2 AI (ref 0.0–0.9)
Scleroderma SCL-70: <0.2 AI (ref 0.0–0.9)

## 2015-12-03 LAB — SPECIMEN STATUS REPORT

## 2015-12-03 LAB — HOMOCYSTEINE: HOMOCYSTEINE: 10.7 umol/L (ref 0.0–15.0)

## 2015-12-03 LAB — CARDIOLIPIN ANTIBODIES, IGG, IGM, IGA
Anticardiolipin IgG: <9 GPL U/mL (ref 0–14)
Anticardiolipin IgM: <9 MPL U/mL (ref 0–12)

## 2015-12-03 LAB — SEDIMENTATION RATE: Sed Rate: 48 mm/hr — ABNORMAL HIGH (ref 0–32)

## 2015-12-03 LAB — ANA: Anti Nuclear Antibody(ANA): POSITIVE — AB

## 2015-12-03 LAB — BETA-2-GLYCOPROTEIN I ABS, IGG/M/A: Beta-2 Glyco I IgG: <9 GPI IgG units (ref 0–20)

## 2015-12-03 LAB — C3 AND C4
COMPLEMENT C4, SERUM: 28 mg/dL (ref 14–44)
Complement C3, Serum: 139 mg/dL (ref 82–167)

## 2015-12-03 LAB — RPR: RPR: NONREACTIVE

## 2015-12-03 LAB — C-REACTIVE PROTEIN: CRP: 2.9 mg/L (ref 0.0–4.9)

## 2015-12-03 LAB — RHEUMATOID FACTOR: Rhuematoid fact SerPl-aCnc: <10 IU/mL (ref 0.0–13.9)

## 2015-12-03 LAB — HIV ANTIBODY (ROUTINE TESTING W REFLEX): HIV Screen 4th Generation wRfx: NONREACTIVE

## 2015-12-03 NOTE — Telephone Encounter (Addendum)
Dr Lucia GaskinsAhern- Received last lab results by PCP. Labs dated 11/11/15. I will place with your notes from today, thank you!

## 2015-12-03 NOTE — Telephone Encounter (Signed)
Called and spoke to [place] from [facility]. I requested last cholesterol panel done on pt be faxed to [phone] per Dr Lucia GaskinsAhern request. She stated she will fax this over.

## 2015-12-07 ENCOUNTER — Other Ambulatory Visit: Payer: Commercial Managed Care - HMO

## 2015-12-07 ENCOUNTER — Ambulatory Visit
Admission: RE | Admit: 2015-12-07 | Discharge: 2015-12-07 | Disposition: A | Discharge status: Home / Self Care | Payer: Commercial Managed Care - HMO | Source: Ambulatory Visit | Attending: Neurology | Admitting: Neurology

## 2015-12-07 DIAGNOSIS — I677 Cerebral arteritis, not elsewhere classified: Secondary | ICD-10-CM

## 2015-12-07 DIAGNOSIS — I63412 Cerebral infarction due to embolism of left middle cerebral artery: Secondary | ICD-10-CM

## 2015-12-07 MED ORDER — IOPAMIDOL (ISOVUE-370) INJECTION 76%
100.0000 mL | Freq: Once | INTRAVENOUS | Status: AC | PRN
  Administered 2015-12-07: 100 mL via INTRAVENOUS

## 2015-12-07 NOTE — Progress Notes (Signed)
Received physician status notification from [facility] that the patient is scheduled 12/11/15-12/14/15 for AMB EEG.

## 2015-12-08 ENCOUNTER — Other Ambulatory Visit: Payer: Self-pay | Admitting: Neurology

## 2015-12-08 ENCOUNTER — Telehealth: Payer: Self-pay | Admitting: *Deleted

## 2015-12-08 DIAGNOSIS — M3500 Sicca syndrome, unspecified: Secondary | ICD-10-CM

## 2015-12-08 NOTE — Telephone Encounter (Signed)
Thanks, will place referral. can you also tell her she was very anemic? She needs to follow up with primary care. Thanks.

## 2015-12-08 NOTE — Telephone Encounter (Signed)
Dr Ahern- pt [name] like referral placed. Can you put it in please? Thank you!  Called and spoke to pt about lab results per Dr Michele GaskinsAhern note below. Pt agreeable to be referred to rheumatology. Advised I will placed referral and she should hear to schedule an appt within a week or two. If she does not she can call me back so I can make sure she gets an appt. She verbalized understanding.

## 2015-12-08 NOTE — Telephone Encounter (Signed)
-----   Message from Anson FretAntonia B Ahern, MD sent at 12/04/2015  4:23 PM EDT ----- Patient's labs showed the following: Her ANA was positive and her ENA SSA/RO Ab was positive (Sjogren's syndrome). However we get a lot of false positive tests on this. I recommend she follow up with rheumatology about it. Let me know if she agrees to go and I I will place the referral thanks.

## 2015-12-08 NOTE — Telephone Encounter (Signed)
Called and spoke to pt. Advised per Dr Lucia GaskinsAhern that she is anemic. She states she has had this for awhile. She has appt with PCP, Arnette FeltsJanece [name] this Friday. She requested I send results to her. I sent results via [facility]. She verbalized understanding.

## 2015-12-09 ENCOUNTER — Other Ambulatory Visit: Payer: Commercial Managed Care - HMO

## 2015-12-09 ENCOUNTER — Telehealth: Payer: Self-pay

## 2015-12-09 NOTE — Telephone Encounter (Signed)
Spoke to pt. I advised her that per Dr. Lucia GaskinsAhern, her CT Angiogram of her neck showed advanced atherosclerosis in the blood vessels. This is from years of uncontrolled DM and HTN. I advised pt that she must control these medical problems or this can lead to more strokes. Pt verbalized understanding of results. Pt had no questions at this time but was encouraged to call back if questions arise.  I advised her that Dr. Lucia GaskinsAhern recommends seeing our stroke specialist, Dr. Roda ShuttersXu in 6-8 weeks. Pt is agreeable. I made an office visit appt per Dr. Lucia GaskinsAhern request for 02/12/16 on Dr. Warren DanesXu's schedule. Pt verbalized understanding.  Katrina, I made an appt for pt with Dr. Roda ShuttersXu and put it in an office visit spot. If this is incorrect, please let me know and I will reschedule her.

## 2015-12-09 NOTE — Telephone Encounter (Signed)
-----   Message from Anson FretAntonia B Ahern, MD sent at 12/09/2015  2:54 PM EDT ----- Patient has advanced atherosclerosis in the blood vessels. This is from years of uncontrolled diabetes and HTN. She really has to control these medical problems or this can also lead to further strokes. I would like her to have a consult in 6-8 weeks with one of our stroke doctors, Dr. Roda ShuttersXu, he is a specialist in stroke. Thanks.

## 2015-12-11 ENCOUNTER — Other Ambulatory Visit: Payer: Commercial Managed Care - HMO

## 2015-12-22 ENCOUNTER — Encounter: Payer: Self-pay | Admitting: *Deleted

## 2015-12-22 ENCOUNTER — Telehealth: Payer: Self-pay | Admitting: Neurology

## 2015-12-22 NOTE — Telephone Encounter (Signed)
Ambulatory 3 day EEG was normal, and no epileptiform discharges seen, no seizures present.

## 2015-12-22 NOTE — Telephone Encounter (Signed)
Called and spoke to pt about EEG results per Dr Lucia GaskinsAhern note below. Reminded pt of upcoming appt on 7/28 at 10am with Dr Roda ShuttersXu. She verbalized understanding.

## 2015-12-22 NOTE — Progress Notes (Signed)
Dr Lucia GaskinsAhern- I put with your notes from today to review. Received physician status notification from [facility] that 72-hr AMB EEG is completed by Dr Lura Emorbier. They faxed results to our office. I will give results to Dr Lucia GaskinsAhern for review.

## 2016-01-29 ENCOUNTER — Other Ambulatory Visit: Payer: Self-pay | Admitting: Nurse Practitioner

## 2016-01-29 DIAGNOSIS — Z1231 Encounter for screening mammogram for malignant neoplasm of breast: Secondary | ICD-10-CM

## 2016-02-12 ENCOUNTER — Ambulatory Visit: Payer: Self-pay | Admitting: Neurology

## 2016-02-12 ENCOUNTER — Ambulatory Visit
Admission: RE | Admit: 2016-02-12 | Discharge: 2016-02-12 | Disposition: A | Discharge status: Home / Self Care | Payer: Commercial Managed Care - HMO | Source: Ambulatory Visit | Attending: Internal Medicine | Admitting: Internal Medicine

## 2016-02-12 DIAGNOSIS — Z1231 Encounter for screening mammogram for malignant neoplasm of breast: Secondary | ICD-10-CM

## 2016-03-02 ENCOUNTER — Ambulatory Visit: Payer: 59 | Admitting: Neurology

## 2016-11-25 ENCOUNTER — Encounter (HOSPITAL_BASED_OUTPATIENT_CLINIC_OR_DEPARTMENT_OTHER): Payer: Self-pay

## 2016-11-25 ENCOUNTER — Other Ambulatory Visit: Payer: Self-pay

## 2016-11-25 ENCOUNTER — Emergency Department (HOSPITAL_BASED_OUTPATIENT_CLINIC_OR_DEPARTMENT_OTHER)
Admission: EM | Admit: 2016-11-25 | Discharge: 2016-11-25 | Disposition: A | Discharge status: Home / Self Care | Payer: 59 | Attending: Emergency Medicine | Admitting: Emergency Medicine

## 2016-11-25 ENCOUNTER — Emergency Department (HOSPITAL_BASED_OUTPATIENT_CLINIC_OR_DEPARTMENT_OTHER): Payer: 59

## 2016-11-25 DIAGNOSIS — Z7984 Long term (current) use of oral hypoglycemic drugs: Secondary | ICD-10-CM | POA: Diagnosis not present

## 2016-11-25 DIAGNOSIS — Z79899 Other long term (current) drug therapy: Secondary | ICD-10-CM | POA: Diagnosis not present

## 2016-11-25 DIAGNOSIS — Z7982 Long term (current) use of aspirin: Secondary | ICD-10-CM | POA: Insufficient documentation

## 2016-11-25 DIAGNOSIS — I10 Essential (primary) hypertension: Secondary | ICD-10-CM | POA: Insufficient documentation

## 2016-11-25 DIAGNOSIS — R1013 Epigastric pain: Secondary | ICD-10-CM | POA: Diagnosis present

## 2016-11-25 DIAGNOSIS — F17210 Nicotine dependence, cigarettes, uncomplicated: Secondary | ICD-10-CM | POA: Diagnosis not present

## 2016-11-25 DIAGNOSIS — R1012 Left upper quadrant pain: Secondary | ICD-10-CM | POA: Diagnosis not present

## 2016-11-25 DIAGNOSIS — Z8673 Personal history of transient ischemic attack (TIA), and cerebral infarction without residual deficits: Secondary | ICD-10-CM | POA: Insufficient documentation

## 2016-11-25 DIAGNOSIS — E119 Type 2 diabetes mellitus without complications: Secondary | ICD-10-CM | POA: Insufficient documentation

## 2016-11-25 LAB — CBC WITH DIFFERENTIAL/PLATELET
Basophils Absolute: 0 K/uL (ref 0.0–0.1)
Basophils Relative: 0 %
Eosinophils Absolute: 0.1 K/uL (ref 0.0–0.7)
Eosinophils Relative: 1 %
HCT: 29.8 % — ABNORMAL LOW (ref 36.0–46.0)
Hemoglobin: 9.4 g/dL — ABNORMAL LOW (ref 12.0–15.0)
Lymphocytes Relative: 20 %
Lymphs Abs: 1.8 K/uL (ref 0.7–4.0)
MCH: 22.3 pg — ABNORMAL LOW (ref 26.0–34.0)
MCHC: 31.5 g/dL (ref 30.0–36.0)
MCV: 70.6 fL — ABNORMAL LOW (ref 78.0–100.0)
Monocytes Absolute: 0.5 K/uL (ref 0.1–1.0)
Monocytes Relative: 6 %
Neutro Abs: 6.3 K/uL (ref 1.7–7.7)
Neutrophils Relative %: 73 %
Platelets: 341 K/uL (ref 150–400)
RBC: 4.22 MIL/uL (ref 3.87–5.11)
RDW: 18.1 % — ABNORMAL HIGH (ref 11.5–15.5)
WBC: 8.7 K/uL (ref 4.0–10.5)

## 2016-11-25 LAB — COMPREHENSIVE METABOLIC PANEL WITH GFR
ALT: 15 U/L (ref 14–54)
AST: 16 U/L (ref 15–41)
Albumin: 3.6 g/dL (ref 3.5–5.0)
Alkaline Phosphatase: 64 U/L (ref 38–126)
Anion gap: 8 (ref 5–15)
BUN: 7 mg/dL (ref 6–20)
CO2: 32 mmol/L (ref 22–32)
Calcium: 10.4 mg/dL — ABNORMAL HIGH (ref 8.9–10.3)
Chloride: 95 mmol/L — ABNORMAL LOW (ref 101–111)
Creatinine, Ser: 0.89 mg/dL (ref 0.44–1.00)
GFR calc Af Amer: >60 mL/min
GFR calc non Af Amer: >60 mL/min
Glucose, Bld: 126 mg/dL — ABNORMAL HIGH (ref 65–99)
Potassium: 2.8 mmol/L — ABNORMAL LOW (ref 3.5–5.1)
Sodium: 135 mmol/L (ref 135–145)
Total Bilirubin: 0.3 mg/dL (ref 0.3–1.2)
Total Protein: 8.2 g/dL — ABNORMAL HIGH (ref 6.5–8.1)

## 2016-11-25 LAB — URINALYSIS, ROUTINE W REFLEX MICROSCOPIC
Bilirubin Urine: NEGATIVE
Glucose, UA: NEGATIVE mg/dL
Hgb urine dipstick: NEGATIVE
Ketones, ur: NEGATIVE mg/dL
Leukocytes, UA: NEGATIVE
Nitrite: NEGATIVE
Protein, ur: 100 mg/dL — AB
Specific Gravity, Urine: 1.013 (ref 1.005–1.030)
pH: 8 (ref 5.0–8.0)

## 2016-11-25 LAB — URINALYSIS, MICROSCOPIC (REFLEX): RBC / HPF: NONE SEEN RBC/hpf (ref 0–5)

## 2016-11-25 LAB — LIPASE, BLOOD: Lipase: 43 U/L (ref 11–51)

## 2016-11-25 LAB — TROPONIN I: Troponin I: <0.03 ng/mL (ref <0.03)

## 2016-11-25 MED ORDER — GI COCKTAIL ~~LOC~~
30.0000 mL | Freq: Once | ORAL | Status: AC
  Administered 2016-11-25: 30 mL via ORAL
  Filled 2016-11-25: qty 30

## 2016-11-25 MED ORDER — OMEPRAZOLE 20 MG PO CPDR: 20.0000 mg | DELAYED_RELEASE_CAPSULE | Freq: Two times a day (BID) | ORAL | 0 refills | Status: DC

## 2016-11-25 MED ORDER — POTASSIUM CHLORIDE CRYS ER 20 MEQ PO TBCR
40.0000 meq | EXTENDED_RELEASE_TABLET | Freq: Once | ORAL | Status: AC
  Administered 2016-11-25: 40 meq via ORAL
  Filled 2016-11-25: qty 2

## 2016-11-25 NOTE — ED Notes (Signed)
Discussed danger of HTN with pt and the need to f/u with PCP

## 2016-11-25 NOTE — ED Triage Notes (Signed)
Upper abdominal pain x 2 days. No n/v/d. Reports extremely uncomfortable.

## 2016-11-25 NOTE — ED Provider Notes (Signed)
[facility] DEPT Provider Note   CSN: [id number] Arrival date & time: 11/25/16  1538   By signing my name below, I, Soijett Blue, attest that this documentation has been prepared under the direction and in the presence of Rodell Perna, PA-C Electronically Signed: [place], ED Scribe. 11/25/16. 4:28 PM.  History   Chief Complaint Chief Complaint  Patient presents with  . Abdominal Pain    HPI Michele [name] is a 46 y.o. female with a PMHx of DM, HTN, who presents to the Emergency Department complaining of 7/10, constant, epigastric and LUQ abdominal pain x pressure sensation onset 2 days worsening yesterday night. Pt reports associated abdominal bloating x 2 months and increased belching x last night. Pt has tried warm showers and TUMS with mild relief of the pt symptoms. She notes that her epigastric and LUQ abdominal pain began following walking her dog x 2 hours prior to the onset of her symptoms. Pt epigastric and LUQ abdominal pain radiates to her back when she is sitting up and laying down and she notes that it is alleviated with leaning forwards or backwards and stretching out. She denies constipation, diarrhea, hematuria, blood in stool, dysuria, nausea, vomiting, CP, SOB, HA, LOC, dizziness, and any other symptoms. She states that she has mild right sided residual weakness following a CVA in the past. She notes that it has been awhile since her last A1C check with her PCP and that she does not always take her hypertension medications.     The history is provided by the patient. No language interpreter was used.    Past Medical History:  Diagnosis Date  . CVA (cerebral infarction)    right side numbness  . Diabetes mellitus   . Hypertension     Patient Active Problem List   Diagnosis Date Noted  . Anemia due to other cause   . Blurry vision, right eye 12/06/2014  . HLD (hyperlipidemia) 08/13/2013  . CVA (cerebral infarction) 08/12/2013  . HTN (hypertension) 08/12/2013   . Diabetes mellitus ([place]) 08/12/2013    Past Surgical History:  Procedure Laterality Date  . btl    . CESAREAN SECTION    . KNEE ARTHROSCOPY    . TUBAL LIGATION      OB History    No data available       Home Medications    Prior to Admission medications   Medication Sig Start Date End Date Taking? Authorizing Provider  amLODipine (NORVASC) 10 MG tablet Take 1 tablet (10 mg total) by mouth daily. 08/14/13  Yes Eulogio Bear U, DO  aspirin 325 MG tablet Take 1 tablet (325 mg total) by mouth daily. 08/14/13  Yes Vann, Jessica U, DO  levETIRAcetam (KEPPRA) 750 MG tablet Take 1 tablet (750 mg total) by mouth 2 (two) times daily. 12/02/15  Yes Melvenia Beam, MD  metoprolol tartrate (LOPRESSOR) 25 MG tablet Take 1 tablet (25 mg total) by mouth 2 (two) times daily. 08/14/13  Yes Vann, Jessica U, DO  atorvastatin (LIPITOR) 10 MG tablet Take 1 tablet (10 mg total) by mouth at bedtime. 12/01/15   Melvenia Beam, MD  glipiZIDE (GLUCOTROL XL) 10 MG 24 hr tablet Take 20 mg by mouth daily. 02/21/14 02/21/15  [provider]  glucose monitoring kit (FREESTYLE) monitoring kit 1 each by Does not apply route as needed for other. 08/14/13   Geradine Girt, DO  ibuprofen (ADVIL,MOTRIN) 200 MG tablet Take 800 mg by mouth every 6 (six) hours as needed  for cramping.    [provider]  lisinopril-hydrochlorothiazide (PRINZIDE,ZESTORETIC) 20-25 MG per tablet Take 2 tablets by mouth daily. 04/01/14 04/01/15  [provider]  lovastatin (MEVACOR) 20 MG tablet Take 20 mg by mouth daily. 04/04/14 04/04/15  [provider]  metFORMIN (GLUCOPHAGE) 1000 MG tablet Take 1,000 mg by mouth daily with breakfast.     [provider]  omeprazole (PRILOSEC) 20 MG capsule Take 1 capsule (20 mg total) by mouth 2 (two) times daily before a meal. 11/25/16 12/09/16  Remer Couse A, PA-C  potassium chloride SA (K-DUR,KLOR-CON) 20 MEQ tablet Take 2 tablets (40 mEq total) by mouth daily.  12/07/14   Domenic Polite, MD    Family History Family History  Problem Relation Age of Onset  . Cancer Father   . Seizures Neg Hx     Social History Social History  Substance Use Topics  . Smoking status: Current Every Day Smoker    Packs/day: 0.50    Years: 16.00  . Smokeless tobacco: Never Used  . Alcohol use No     Allergies   Patient has no known allergies.   Review of Systems Review of Systems  Respiratory: Negative for shortness of breath.   Cardiovascular: Negative for chest pain.  Gastrointestinal: Positive for abdominal pain (epigastric and LUQ). Negative for blood in stool, constipation, diarrhea, nausea and vomiting.  Genitourinary: Negative for dysuria and hematuria.  Neurological: Negative for syncope and headaches.     Physical Exam Updated Vital Signs BP (!) 186/92 (BP Location: Right Arm) Comment: pt states she is not very good at remembering her BP medications.  Pulse 86   Temp 99.1 F (37.3 C) (Oral)   Resp 18   Ht 5' (1.524 m)   Wt 216 lb (98 kg)   LMP 11/11/2016   SpO2 100%   BMI 42.18 kg/m   Physical Exam  Constitutional: She is oriented to person, place, and time. She appears well-developed and well-nourished. No distress.  HENT:  Head: Normocephalic and atraumatic.  Eyes: EOM are normal.  Neck: Neck supple.  Cardiovascular: Regular rhythm and normal heart sounds.  Tachycardia present.  Exam reveals no gallop and no friction rub.   No murmur heard. Pulmonary/Chest: Effort normal and breath sounds normal. No respiratory distress. She has no wheezes. She has no rales.  Abdominal: Soft. She exhibits no distension. Bowel sounds are increased. There is no tenderness. There is no CVA tenderness, no tenderness at McBurney's point and negative Murphy's sign.  Epigastric and LUQ are non-tender, but pt reports soreness with palpation. Bowel sounds increased. Negative rovsing sign. Negative murphys sign. No CVA tenderness.  Musculoskeletal:  Normal range of motion.  No midline spinal or paraspinal tenderness.  Neurological: She is alert and oriented to person, place, and time.  Skin: Skin is warm and dry.  Psychiatric: She has a normal mood and affect. Her behavior is normal.  Nursing note and vitals reviewed.    ED Treatments / Results  DIAGNOSTIC STUDIES: Oxygen Saturation is 100% on RA, nl by my interpretation.    COORDINATION OF CARE: 4:24 PM Discussed treatment plan with pt at bedside which includes EKG, labs, UA, and pt agreed to plan.   Labs (all labs ordered are listed, but only abnormal results are displayed) Labs Reviewed  CBC WITH DIFFERENTIAL/PLATELET - Abnormal; Notable for the following:       Result Value   Hemoglobin 9.4 (*)    HCT 29.8 (*)    MCV 70.6 (*)  MCH 22.3 (*)    RDW 18.1 (*)    All other components within normal limits  COMPREHENSIVE METABOLIC PANEL - Abnormal; Notable for the following:    Potassium 2.8 (*)    Chloride 95 (*)    Glucose, Bld 126 (*)    Calcium 10.4 (*)    Total Protein 8.2 (*)    All other components within normal limits  URINALYSIS, ROUTINE W REFLEX MICROSCOPIC - Abnormal; Notable for the following:    APPearance CLOUDY (*)    Protein, ur 100 (*)    All other components within normal limits  URINALYSIS, MICROSCOPIC (REFLEX) - Abnormal; Notable for the following:    Bacteria, UA RARE (*)    Squamous Epithelial / LPF 6-30 (*)    All other components within normal limits  LIPASE, BLOOD  TROPONIN I    EKG  EKG Interpretation  Date/Time:  Friday Nov 25 2016 16:00:42 EDT Ventricular Rate:  97 PR Interval:  154 QRS Duration: 86 QT Interval:  358 QTC Calculation: 454 R Axis:   -31 Text Interpretation:  Normal sinus rhythm Possible Left atrial enlargement Left axis deviation Abnormal ECG new T wave changes in V1 and V2 NO STEMI     Confirmed by [facility] MD, PEDRO ([id number]) on 11/25/2016 5:18:33 PM       Radiology Dg Chest 2 View  Result Date:  11/25/2016 CLINICAL DATA:  Upper abdominal pain for 2 days, diabetes mellitus, hypertension, history stroke EXAM: CHEST  2 VIEW COMPARISON:  08/12/2013 FINDINGS: Upper normal heart size. Mildly tortuous aorta. Mediastinal contours and pulmonary vascularity normal. Minimal chronic peribronchial thickening. Lungs clear. No pleural effusion or pneumothorax. Bones unremarkable. IMPRESSION: Minimal chronic bronchitic changes without infiltrate. Electronically Signed   By: Lavonia Dana M.D.   On: 11/25/2016 17:37    Procedures Procedures (including critical care time)  Medications Ordered in ED Medications  gi cocktail (Maalox,Lidocaine,Donnatal) (30 mLs Oral Given 11/25/16 1650)  potassium chloride SA (K-DUR,KLOR-CON) CR tablet 40 mEq (40 mEq Oral Given 11/25/16 1723)     Initial Impression / Assessment and Plan / ED Course  I have reviewed the triage vital signs and the nursing notes.  Pertinent labs & imaging results that were available during my care of the patient were reviewed by me and considered in my medical decision making (see chart for details).     Patient with epigastric and left upper quadrant pressure for 2 days with associated increased belching. Afebrile, patient hypertensive but states she has not taken her blood pressure medication and some time and that she does not take it every day. Found to be hypokalemic at 2.8, replenished while in ED. UA not concerning for UTI. Lipase negative. GI cocktail given, with some improvement in symptoms. Chest x-ray shows minimal bronchitic changes without infiltrate or evidence of pneumonia or pleural effusion. Troponin negative. EKG shows normal sinus rhythm with nonspecific T-wave inversions. Low suspicion of pancreatitis, cholecystitis, appendicitis, or cardiac etiology of symptoms such as ACS/MI. Suspect symptoms may be related to gastroparesis or reflux. Strongly encourage patient to follow-up with primary care for reevaluation of her blood  pressure and diabetes and further evaluation of her abdominal symptoms. Discussed strict ED return precautions. She has potassium supplements at home from the last time she was found to be hypokalemic, instruct the patient to take once daily for the next week. Also discharged with Rx for omeprazole for the next 2 weeks. Pt verbalized understanding of and agreement with plan and is safe for  discharge home at this time.   Final Clinical Impressions(s) / ED Diagnoses   Final diagnoses:  Left upper quadrant pain    New Prescriptions Discharge Medication List as of 11/25/2016  6:09 PM    START taking these medications   Details  omeprazole (PRILOSEC) 20 MG capsule Take 1 capsule (20 mg total) by mouth 2 (two) times daily before a meal., Starting Fri 11/25/2016, Until Fri 12/09/2016, Print       I personally performed the services described in this documentation, which was scribed in my presence. The recorded information has been reviewed and is accurate.     Renita Papa, PA-C 11/26/16 2237    Fatima Blank, MD 11/29/16 [phone]

## 2016-11-25 NOTE — ED Notes (Signed)
ED Provider at bedside. 

## 2016-11-25 NOTE — Discharge Instructions (Signed)
Take Prilosec twice daily before meals for the next 2 weeks. Take your potassium supplements twice daily for the next 7 days. He may apply a heating pad to the upper abdominal area for comfort. Naproxen may be helpful as well for your discomfort. Follow-up with primary care in the next few days for reevaluation of your diabetes, blood pressure, and this abdominal pain. Return to the ED if he develops any concerning symptoms.

## 2016-11-25 NOTE — ED Notes (Signed)
Patient transported to X-ray 

## 2017-01-02 ENCOUNTER — Encounter: Payer: Self-pay | Admitting: Neurology

## 2017-01-02 ENCOUNTER — Ambulatory Visit (INDEPENDENT_AMBULATORY_CARE_PROVIDER_SITE_OTHER): Payer: 59 | Admitting: Neurology

## 2017-01-02 VITALS — BP 187/104 | HR 100 | Ht 60.0 in | Wt 212.6 lb

## 2017-01-02 DIAGNOSIS — I6930 Unspecified sequelae of cerebral infarction: Secondary | ICD-10-CM | POA: Diagnosis not present

## 2017-01-02 DIAGNOSIS — I639 Cerebral infarction, unspecified: Secondary | ICD-10-CM | POA: Diagnosis not present

## 2017-01-02 DIAGNOSIS — G40109 Localization-related (focal) (partial) symptomatic epilepsy and epileptic syndromes with simple partial seizures, not intractable, without status epilepticus: Secondary | ICD-10-CM | POA: Diagnosis not present

## 2017-01-02 DIAGNOSIS — M3500 Sicca syndrome, unspecified: Secondary | ICD-10-CM | POA: Diagnosis not present

## 2017-01-02 DIAGNOSIS — Z9119 Patient's noncompliance with other medical treatment and regimen: Secondary | ICD-10-CM

## 2017-01-02 DIAGNOSIS — Z91199 Patient's noncompliance with other medical treatment and regimen due to unspecified reason: Secondary | ICD-10-CM | POA: Insufficient documentation

## 2017-01-02 MED ORDER — ASPIRIN 325 MG PO TABS: 325.0000 mg | ORAL_TABLET | Freq: Every day | ORAL | Status: AC

## 2017-01-02 MED ORDER — AMLODIPINE BESYLATE 10 MG PO TABS: 10.0000 mg | ORAL_TABLET | Freq: Every day | ORAL | 11 refills | Status: DC

## 2017-01-02 MED ORDER — METOPROLOL TARTRATE 25 MG PO TABS: 25.0000 mg | ORAL_TABLET | Freq: Two times a day (BID) | ORAL | 11 refills | Status: DC

## 2017-01-02 MED ORDER — LEVETIRACETAM 750 MG PO TABS: 750.0000 mg | ORAL_TABLET | Freq: Two times a day (BID) | ORAL | 11 refills | Status: DC

## 2017-01-02 NOTE — Progress Notes (Signed)
[facility]  Provider: Dr Jaynee Eagles Referring Provider: Dr. Laurance Flatten Primary Care Physician: Minette Brine  CC: Abnormal involuntary movements.   Interval history 01/02/2017: Patient has had multiple strokes in multiple vascular territories. . She was started on Keppra for abnormal movements of the arm that could be partial motor seizures. CTA of the neck showed advanced atherosclerosis in the blood vessels from years of uncontrolled diabetes and hypertension. She was referred to our stroke specialist Dr.Xu for second opinion but she canceled her appointment and did not reschedule. 72 hour EEG was also performed and normal. Extensive lab testing for hypercoagulable disorders, vasculitis and other causes for her strokes was completed and she was referred to rheumatology for positive ANA and SSA antibodies. She did not take her blood pressure medications, she refuses lipitor, she is not taking aspirin either, very noncompliant. She never went to rheumatology or saw Dr. Erlinda Hong and will reorder. Discussed medication compliance.    Interval history 12/01/2015: PMHx multiple strokes (per MRI left frontal temporal lobe. left thalamus and right posterior putamen. right inferior cerebellum and left inferior cerebellum, medical noncompliance, uncontrolled HTN and diabetes in the past. She is here for random movements in her right hand and arm. Patient is here for follow-up, she was seen on last appointment and MRI of the brain was ordered. She is here to follow up for both. A she reports she has been noncompliant with her aspirin since her last stroke, she only started taking her aspirin a few days ago. Discussed compliance given that her MRI shows multiple strokes in multiple vascular distributions. We probably need to discuss an embolic source, vasculitis, hypercoagulable state or other causes of her multiple strokes. On this most recent MRI done, 2 new subacute strokes that appear embolic were seen.  We'll perform a thorough workup. She continues to have episodes. She has been non compliant with all her medications. She is now taking aspirin 397m daily.  Had lipid completed at [facility], will call for LDL may need to start a statin. Also discussed starting antiepileptic medication and she has many foci in the brain due to previous strokes that could cause abnormalities such as seizures.   MRI BRAIN 11/20/2015: This MRI of the brain with and without contrast shows the following: 1. Enhancing foci in the posterior left frontal lobe involving gray and white matter, not present on diffusion-weighted images. This is nonspecific and could represent vasculitis, neurosarcoid, other inflammation or subacute ischemic changes. These 2 foci were not present on MRI 12/06/2014. 2. Chronic moderate sized left frontal stroke, chronic left thalamic lacunar infarction, chronic ischemic foci in the posterior right putamen and cerebellar hemispheres and T2/flair hyperintense foci elsewhere in the hemispheres consistent with chronic microvascular ischemic change. These ischemic changes appear to be essentially unchanged when compared to the prior MRI.  HPI: Michele [name] is a 46y.o. female here as a referral from [employer] PMHx multiple strokes (per MRI left frontal temporal lobe. left thalamus and right posterior putamen. right inferior cerebellum and left inferior cerebellum, medical noncompliance, uncontrolled HTN and diabetes in the past. She is here for random movements in her right hand and arm. She was trying to play candy crush on her phone Sunday and she couldn't control her arm, last Sunday leg started acting funny and she couldn't walk, she just fell over and landed on the couch, These movements started last week and they keep happening. Monday she was getting a hot dog in the drive through and  she reached her foot up to stop the car and her foot would not land on the  break but her foot was just moving around and she scraped a car. She could not control her foot. Yesterday she was blow drying her daughter's hair and her arm started moving on its own. Left side unaffected. The riht arm just started moving, she felt off balance, when she got up her leg was also affected, she couldn't balance herself. Has happened 3 days last week. It lasts for 2 minutes, no aura, no confusion afterwards. No urination, defectation, biting of tongue. Nothing makes it better or worse, it just stops on its own. No loss of consciousness or awareness, no associated focal neurologic deficits such as slurred speech, headaches, numbness or tingling. She has right-sided deficits with weakness and numbness which is a sequelae from her previous strokes. No inciting events, no head trauma, no new medications, no previous illnesses. No personal history of seizures. Mother has seizure. Mother has lupus and RA. She is under a lot of stress at home. No other focal neurologic deficits.  Reviewed notes, labs and imaging from outside physicians, which showed: was hospitalized in the ED on 12/06/2014 for worsening of right vision. Patient stated that approximately 2 weeks prior she developed gradual onset of blurred vision in her right eye that got getter for a couple of days but then started worsening to the point that impaired her ability to see. She said that she is able to see the full TV screen " but with patches in my right eye field, random cloudiness in vision and los of vision in spots". Denies associated eye pain, HA, eyelid ptosis, eye trauma, vertigo, double vision, focal weakness, imbalance, slurred speech, language or vision impairment. Neuro-exam significant only for some impairment visual field right upper quadrant. MRI was negative for new evidence of stroke or optic neuritis.   HgbA1c 11.7 a year ago, blood pressure was elevated while inpatient, had hyponatremia and hypochloremia while inpatient  due to HCTZ use. She was discharged with follow up with Ophthalmology.   Labs 11/2014: TSH 0.428, B12 438, last HgbA1c 07/2013 11.7.   MRI HEAD FINDINGS 12/06/2014: personally reviewed images and agree with the following:  Image quality degraded by mild motion  Negative for acute infarct  Chronic infarct in the left frontal temporal lobe. Chronic infarct in the left thalamus and right posterior putamen. Chronic infarct in the right inferior cerebellum and left inferior cerebellum.  Negative for hemorrhage. No mass lesion.  Postcontrast imaging reveals enhancement of septations within the left frontal infarct, some which are vascular and others may be due to granulation tissue. No underlying mass lesion.  MRI ORBITS FINDINGS  Image quality degraded by motion.  Optic nerve normal in signal and size. Optic chiasm normal.Pituitary not enlarged. No enhancing lesion within the optic nerve. Small lesions could be missed given the amount of motion.  Extraocular muscles are normal in size and signal. Orbital fat is normal. The globe is normal in shape without focal abnormality.  The lacrimal gland is normal. No orbital mass is seen postcontrast administration  Minimal mucosal edema in the paranasal sinuses without air-fluid level.  IMPRESSION: Chronic ischemic change. No acute infarct or mass.  Orbital study is degraded by motion. Allowing for this, no significant abnormality is detected.   Review of Systems: Patient complains of symptoms per HPI as well as the following symptoms: Apnea, frequent waking. Pertinent negatives and positives per HPI. All others negative.  Social History   Social History  . Marital status: Married    Spouse name: Montine Circle  . Number of children: N/A  . Years of education: 79   Occupational History  . [employer]    Social History Main Topics  . Smoking status: Current Every Day Smoker    Packs/day: 0.30    Years: 16.00    . Smokeless tobacco: Never Used  . Alcohol use No  . Drug use: No  . Sexual activity: Not on file   Other Topics Concern  . Not on file   Social History Narrative   Lives with husband and kids   Right-handed   Caffeine use: large cup of coffee Mon through Fri    Family History  Problem Relation Age of Onset  . Cancer Father   . Lupus Mother   . Diabetes Mother   . Rheum arthritis Mother   . Heart disease Mother   . Stroke Mother   . Kidney disease Maternal Grandmother   . Heart attack Maternal Grandfather   . Seizures Neg Hx     Past Medical History:  Diagnosis Date  . CVA (cerebral infarction)    right side numbness  . Diabetes mellitus   . Hypertension     Past Surgical History:  Procedure Laterality Date  . btl    . CESAREAN SECTION    . KNEE ARTHROSCOPY    . TUBAL LIGATION      Current Outpatient Prescriptions  Medication Sig Dispense Refill  . amLODipine (NORVASC) 10 MG tablet Take 1 tablet (10 mg total) by mouth daily. 30 tablet 11  . glucose monitoring kit (FREESTYLE) monitoring kit 1 each by Does not apply route as needed for other. 1 each 0  . ibuprofen (ADVIL,MOTRIN) 200 MG tablet Take 800 mg by mouth every 6 (six) hours as needed for cramping.    . levETIRAcetam (KEPPRA) 750 MG tablet Take 1 tablet (750 mg total) by mouth 2 (two) times daily. 60 tablet 11  . metFORMIN (GLUCOPHAGE) 1000 MG tablet Take 1,000 mg by mouth daily with breakfast.     . metoprolol tartrate (LOPRESSOR) 25 MG tablet Take 1 tablet (25 mg total) by mouth 2 (two) times daily. 60 tablet 11  . aspirin 325 MG tablet Take 1 tablet (325 mg total) by mouth daily.    Marland Kitchen glipiZIDE (GLUCOTROL XL) 10 MG 24 hr tablet Take 20 mg by mouth daily.    Marland Kitchen omeprazole (PRILOSEC) 20 MG capsule Take 1 capsule (20 mg total) by mouth 2 (two) times daily before a meal. 28 capsule 0   No current facility-administered medications for this visit.     Allergies as of 01/02/2017  . (No Known Allergies)     Vitals: BP (!) 187/104 Comment: Has not taken BP meds this morning  Pulse 100   Ht 5' (1.524 m)   Wt 212 lb 9.6 oz (96.4 kg)   BMI 41.52 kg/m  Last Weight:  Wt Readings from Last 1 Encounters:  01/02/17 212 lb 9.6 oz (96.4 kg)   Last Height:   Ht Readings from Last 1 Encounters:  01/02/17 5' (1.524 m)   Physical exam: Exam: Gen: NAD, conversant, well nourised, obese, well groomed                     CV: RRR, no MRG. No Carotid Bruits. No peripheral edema, warm, nontender Eyes: Conjunctivae clear without exudates or hemorrhage  Neuro: Detailed Neurologic Exam  Speech:    Speech is normal; fluent and spontaneous with normal comprehension.  Cognition:    The patient is oriented to person, place, and time;     recent and remote memory intact;     language fluent;     normal attention, concentration,     fund of knowledge Cranial Nerves:    The pupils are equal, round, and reactive to light. The fundi are normal and spontaneous venous pulsations are present. Visual fields are full to finger confrontation. Extraocular movements are intact. Trigeminal sensation is intact and the muscles of mastication are normal. The face is symmetric. The palate elevates in the midline. Hearing intact. Voice is normal. Shoulder shrug is normal. The tongue has normal motion without fasciculations.   Coordination:    Normal finger to nose and heel to shin. Normal rapid alternating movements.   Gait:    Heel-toe and tandem gait are normal.   Motor Observation:    No asymmetry, no atrophy, and no involuntary movements noted. Tone:    Normal muscle tone.    Posture:    Posture is normal. normal erect    Strength:    Strength is V/V in the upper and lower limbs.      Sensation: intact to LT     Reflex Exam:  DTR's:    Deep tendon reflexes in the upper and lower extremities are normal bilaterally.   Toes:    The toes are downgoing bilaterally.   Clonus:    Clonus is  absent.  Assessment/Plan: 46 y.o. female here as a referral from [employer]. PMHx multiple strokes (per MRI left frontal temporal lobe. left thalamus and right posterior putamen. right inferior cerebellum and left inferior cerebellum), medical noncompliance, uncontrolled HTN and diabetes.  MRI shows left probable subacute infarcts that appear embolic. She had  random movements in her right hand and arm controlled with Keppa. 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  CTA showed diffuse atherosclerosis likely due to medical noncompliance.   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 Erlinda Hong I recommend first avail appt but she won;t come back until 4-6 weeks.  +ANA and +SSA: Refer to rheum again Discucssed medical compliance - Highly encouraged her to be compliant with aspirin 325 mg for stroke prevention. Take daily. Encouraged f/u with pcp within 4 weeks. - Discussed Patients with epilepsy have a small risk of sudden unexpected death, a condition referred to as sudden unexpected death in epilepsy (SUDEP). SUDEP is defined specifically as the sudden, unexpected, witnessed or unwitnessed, nontraumatic and nondrowning death in patients with epilepsy with or without evidence for a seizure, and excluding documented status epilepticus, in which post mortem examination does not reveal a structural or toxicologic cause for death  Addendum: Labs from pcp 10/2015: ldl 111 (goal <70). HgbA1c 10.8 (uncontrolled goal < 7), cbc/cmp unremarkable creatinine0.92  I had a long d/w patient about her strokes, risk for recurrent stroke/TIAs, personally independently reviewed imaging studies and stroke evaluation results and answered questions.Continue ASA full dosefor secondary stroke prevention and maintain strict control of hypertension with  blood pressure goal below 130/90, diabetes with hemoglobin A1c goal below 6.5% and lipids with LDL cholesterol goal below 70 mg/dL. I also advised the patient to eat a healthy diet with plenty of whole grains, cereals, fruits and vegetables, exercise regularly and  maintain ideal body weight .  Cc: Lavona Mound, MD  [facility] [address] [place], [place] [address]  Phone [phone] Fax [phone]  A total of 25 minutes was spent face-to-face with this patient. Over half this time was spent on counseling patient on the stroke, seizure, medical non compliance diagnosis and different diagnostic and therapeutic options available, counseling and coordination of care

## 2017-01-02 NOTE — Patient Instructions (Addendum)
Remember to drink plenty of fluid, eat healthy meals and do not skip any meals. Try to eat protein with a every meal and eat a healthy snack such as fruit or nuts in between meals. Try to keep a regular sleep-wake schedule and try to exercise daily, particularly in the form of walking, 20-30 minutes a day, if you can.   As far as your medications are concerned, I would like to suggest: Please all your medications  As far as diagnostic testing: Rheumatology referral, 2nd opinion with Dr. Roda ShuttersXu  I would like to see you back in 6 months, sooner if we need to. Please call us with any interim questions, concerns, problems, updates or refill requests.   Our phone number is [phone]. We also have an after hours call service for urgent matters and there is a physician on-call for urgent questions. For any emergencies you know to call 911 or go to the nearest emergency room

## 2017-01-05 ENCOUNTER — Other Ambulatory Visit: Payer: Self-pay | Admitting: Nurse Practitioner

## 2017-01-05 DIAGNOSIS — Z1231 Encounter for screening mammogram for malignant neoplasm of breast: Secondary | ICD-10-CM

## 2017-02-13 ENCOUNTER — Ambulatory Visit
Admission: RE | Admit: 2017-02-13 | Discharge: 2017-02-13 | Disposition: A | Discharge status: Home / Self Care | Payer: 59 | Source: Ambulatory Visit | Attending: Nurse Practitioner | Admitting: Nurse Practitioner

## 2017-02-13 DIAGNOSIS — Z1231 Encounter for screening mammogram for malignant neoplasm of breast: Secondary | ICD-10-CM

## 2017-02-28 ENCOUNTER — Emergency Department (HOSPITAL_BASED_OUTPATIENT_CLINIC_OR_DEPARTMENT_OTHER): Payer: 59

## 2017-02-28 ENCOUNTER — Encounter (HOSPITAL_BASED_OUTPATIENT_CLINIC_OR_DEPARTMENT_OTHER): Payer: Self-pay | Admitting: *Deleted

## 2017-02-28 ENCOUNTER — Emergency Department (HOSPITAL_BASED_OUTPATIENT_CLINIC_OR_DEPARTMENT_OTHER)
Admission: EM | Admit: 2017-02-28 | Discharge: 2017-02-28 | Disposition: A | Discharge status: Home / Self Care | Payer: 59 | Attending: Emergency Medicine | Admitting: Emergency Medicine

## 2017-02-28 DIAGNOSIS — Z7984 Long term (current) use of oral hypoglycemic drugs: Secondary | ICD-10-CM | POA: Diagnosis not present

## 2017-02-28 DIAGNOSIS — I10 Essential (primary) hypertension: Secondary | ICD-10-CM | POA: Diagnosis not present

## 2017-02-28 DIAGNOSIS — Z7982 Long term (current) use of aspirin: Secondary | ICD-10-CM | POA: Insufficient documentation

## 2017-02-28 DIAGNOSIS — F17210 Nicotine dependence, cigarettes, uncomplicated: Secondary | ICD-10-CM | POA: Insufficient documentation

## 2017-02-28 DIAGNOSIS — J029 Acute pharyngitis, unspecified: Secondary | ICD-10-CM | POA: Diagnosis not present

## 2017-02-28 DIAGNOSIS — Z8673 Personal history of transient ischemic attack (TIA), and cerebral infarction without residual deficits: Secondary | ICD-10-CM | POA: Diagnosis not present

## 2017-02-28 DIAGNOSIS — Z79899 Other long term (current) drug therapy: Secondary | ICD-10-CM | POA: Diagnosis not present

## 2017-02-28 DIAGNOSIS — R05 Cough: Secondary | ICD-10-CM | POA: Diagnosis present

## 2017-02-28 DIAGNOSIS — E119 Type 2 diabetes mellitus without complications: Secondary | ICD-10-CM | POA: Insufficient documentation

## 2017-02-28 DIAGNOSIS — J069 Acute upper respiratory infection, unspecified: Secondary | ICD-10-CM | POA: Diagnosis not present

## 2017-02-28 LAB — RAPID STREP SCREEN (MED CTR MEBANE ONLY): STREPTOCOCCUS, GROUP A SCREEN (DIRECT): NEGATIVE

## 2017-02-28 MED ORDER — CLONIDINE HCL 0.1 MG PO TABS
0.2000 mg | ORAL_TABLET | Freq: Once | ORAL | Status: AC
  Administered 2017-02-28: 0.2 mg via ORAL
  Filled 2017-02-28: qty 2

## 2017-02-28 MED ORDER — AMLODIPINE BESYLATE 10 MG PO TABS: 10.0000 mg | ORAL_TABLET | Freq: Every day | ORAL | 0 refills | Status: DC

## 2017-02-28 MED ORDER — GUAIFENESIN 200 MG PO TABS: 200.0000 mg | ORAL_TABLET | Freq: Four times a day (QID) | ORAL | 0 refills | Status: DC | PRN

## 2017-02-28 MED ORDER — FLUTICASONE PROPIONATE 50 MCG/ACT NA SUSP: 2.0000 | Freq: Every day | NASAL | 0 refills | Status: DC

## 2017-02-28 MED ORDER — GI COCKTAIL ~~LOC~~
30.0000 mL | Freq: Once | ORAL | Status: AC
Start: 2017-02-28 — End: 2017-02-28
  Administered 2017-02-28: 30 mL via ORAL
  Filled 2017-02-28: qty 30

## 2017-02-28 MED ORDER — LIDOCAINE VISCOUS 2 % MT SOLN: 15.0000 mL | OROMUCOSAL | 0 refills | Status: AC | PRN

## 2017-02-28 NOTE — ED Provider Notes (Signed)
[place] DEPT [facility] Provider Note   CSN: [id number] Arrival date & time: 02/28/17  2049     History   Chief Complaint Chief Complaint  Patient presents with  . Cough    HPI Michele [name] is a 46 y.o. female who presents to the emergency department today for cough, sore throat, congestion since Friday. The patient states that her 17 year old child has similar symptoms last week before she started developing a productive cough with clear sputum. No hemoptysis. She notes that over the week she started developing rhinorrhea, sneezing, congestion and sore throat. She has taken DayQuil and NyQuil for this with mild relief. She is concerned that she may have strep throat or pneumonia. She denies history of COPD/asthma/CHF. The patient is a smoker, half pack per day. She states that she has no difficulty handling her secretions, talking, breathing. Denies fever, chills, weight loss, night sweats, myalgias, chest pain, sob, doe, nausea, vomiting.   HPI  Past Medical History:  Diagnosis Date  . CVA (cerebral infarction)    right side numbness  . Diabetes mellitus   . Hypertension     Patient Active Problem List   Diagnosis Date Noted  . Medically noncompliant 01/02/2017  . Partial motor seizures ([place]) 01/02/2017  . Chronic ischemic multifocal multiple vascular territories stroke 01/02/2017  . Anemia due to other cause   . Blurry vision, right eye 12/06/2014  . HLD (hyperlipidemia) 08/13/2013  . CVA (cerebral infarction) 08/12/2013  . HTN (hypertension) 08/12/2013  . Diabetes mellitus ([place]) 08/12/2013    Past Surgical History:  Procedure Laterality Date  . btl    . CESAREAN SECTION    . KNEE ARTHROSCOPY    . TUBAL LIGATION      OB History    No data available       Home Medications    Prior to Admission medications   Medication Sig Start Date End Date Taking? Authorizing Provider  amLODipine (NORVASC) 10 MG tablet Take 1 tablet (10 mg total) by mouth daily.  01/02/17   Melvenia Beam, MD  amLODipine (NORVASC) 10 MG tablet Take 1 tablet (10 mg total) by mouth daily. 02/28/17   Zoi Devine, Barth Kirks, PA-C  aspirin 325 MG tablet Take 1 tablet (325 mg total) by mouth daily. 01/02/17   Melvenia Beam, MD  fluticasone (FLONASE) 50 MCG/ACT nasal spray Place 2 sprays into both nostrils daily. 02/28/17   Livingston Denner, Barth Kirks, PA-C  glipiZIDE (GLUCOTROL XL) 10 MG 24 hr tablet Take 20 mg by mouth daily. 02/21/14 02/21/15  [provider]  glucose monitoring kit (FREESTYLE) monitoring kit 1 each by Does not apply route as needed for other. 08/14/13   Geradine Girt, DO  guaiFENesin 200 MG tablet Take 1 tablet (200 mg total) by mouth every 6 (six) hours as needed for cough or to loosen phlegm. 02/28/17   Lexandra Rettke, Barth Kirks, PA-C  ibuprofen (ADVIL,MOTRIN) 200 MG tablet Take 800 mg by mouth every 6 (six) hours as needed for cramping.    [provider]  levETIRAcetam (KEPPRA) 750 MG tablet Take 1 tablet (750 mg total) by mouth 2 (two) times daily. 01/02/17   Melvenia Beam, MD  lidocaine (XYLOCAINE) 2 % solution Use as directed 15 mLs in the mouth or throat as needed for mouth pain. 02/28/17   Saidy Ormand, Barth Kirks, PA-C  metFORMIN (GLUCOPHAGE) 1000 MG tablet Take 1,000 mg by mouth daily with breakfast.     [provider]  metoprolol tartrate (LOPRESSOR) 25  MG tablet Take 1 tablet (25 mg total) by mouth 2 (two) times daily. 01/02/17   Melvenia Beam, MD  omeprazole (PRILOSEC) 20 MG capsule Take 1 capsule (20 mg total) by mouth 2 (two) times daily before a meal. 11/25/16 12/09/16  Renita Papa, PA-C    Family History Family History  Problem Relation Age of Onset  . Cancer Father   . Lupus Mother   . Diabetes Mother   . Rheum arthritis Mother   . Heart disease Mother   . Stroke Mother   . Kidney disease Maternal Grandmother   . Heart attack Maternal Grandfather   . Seizures Neg Hx     Social History Social History  Substance Use Topics  .  Smoking status: Current Every Day Smoker    Packs/day: 0.30    Years: 16.00  . Smokeless tobacco: Never Used  . Alcohol use No     Allergies   Patient has no known allergies.   Review of Systems Review of Systems  All other systems reviewed and are negative.    Physical Exam Updated Vital Signs BP (!) 186/100 (BP Location: Left Arm)   Pulse 99   Temp 98.6 F (37 C) (Oral)   Resp 20   Ht 5' (1.524 m)   Wt 97.5 kg (215 lb)   LMP 02/25/2017   SpO2 99%   BMI 41.99 kg/m   Physical Exam  Constitutional: She appears well-developed and well-nourished.  HENT:  Head: Normocephalic and atraumatic.  Right Ear: Hearing, tympanic membrane, external ear and ear canal normal.  Left Ear: Hearing, tympanic membrane, external ear and ear canal normal.  Nose: Mucosal edema present. Right sinus exhibits no maxillary sinus tenderness and no frontal sinus tenderness. Left sinus exhibits no maxillary sinus tenderness and no frontal sinus tenderness.  Mouth/Throat: Mucous membranes are normal.  The patient has normal phonation and is in control of secretions. No stridor.  Midline uvula. No tonsillar erythema or exudates. Cobblestoning of posterior pharynx. No creptius on neck palpation and patient has good dentition. No gingival erythema or fluctuance noted.   Eyes: Pupils are equal, round, and reactive to light. Right eye exhibits no discharge. Left eye exhibits no discharge. No scleral icterus.  Neck: Neck supple.  Cardiovascular: Normal rate, regular rhythm and intact distal pulses.   No murmur heard. Pulses:      Radial pulses are 2+ on the right side, and 2+ on the left side.       Dorsalis pedis pulses are 2+ on the right side, and 2+ on the left side.       Posterior tibial pulses are 2+ on the right side, and 2+ on the left side.  No lower extremity swelling or edema. Calves symmetric in size bilaterally.  Pulmonary/Chest: Effort normal and breath sounds normal. She exhibits no  tenderness.  Abdominal: Soft. Bowel sounds are normal. There is no tenderness. There is no rebound and no guarding.  Musculoskeletal: She exhibits no edema.  Lymphadenopathy:    She has no cervical adenopathy.  Neurological: She is alert.  Speech clear. Follows commands. No facial droop. PERRLA. EOMI. CN III-XII intact.  Grossly moves all extremities 4 without ataxia. Coordination intact. Able and appropriate strength for age to upper and lower extremities bilaterally including grip strength. Sensation to light touch intact bilaterally for upper and lower. Normal gait.   Skin: Skin is warm and dry. No rash noted. She is not diaphoretic.  Psychiatric: She has a normal mood  and affect.  Nursing note and vitals reviewed.    ED Treatments / Results  Labs (all labs ordered are listed, but only abnormal results are displayed) Labs Reviewed  RAPID STREP SCREEN (NOT AT [facility])  CULTURE, GROUP A STREP [facility])    EKG  EKG Interpretation None       Radiology Dg Chest 2 View  Result Date: 02/28/2017 CLINICAL DATA:  Shortness of breath and sore throat EXAM: CHEST  2 VIEW COMPARISON:  Nov 25, 2016 FINDINGS: There is no edema or consolidation. Heart size and pulmonary vascularity are normal. No adenopathy. There is degenerative change in thoracic spine. IMPRESSION: No edema or consolidation. Electronically Signed   By: Lowella Grip III M.D.   On: 02/28/2017 21:23    Procedures Procedures (including critical care time)  Medications Ordered in ED Medications  gi cocktail (Maalox,Lidocaine,Donnatal) (30 mLs Oral Given 02/28/17 2229)  cloNIDine (CATAPRES) tablet 0.2 mg (0.2 mg Oral Given 02/28/17 2228)     Initial Impression / Assessment and Plan / ED Course  I have reviewed the triage vital signs and the nursing notes.  Pertinent labs & imaging results that were available during my care of the patient were reviewed by me and considered in my medical decision making (see chart for  details).      46 year old with cough, and congestion. Pt CXR negative for acute infiltrate. Strep test negative. Patients symptoms are consistent with URI, likely viral etiology. Discussed that antibiotics are not indicated for viral infections. Pt will be discharged with symptomatic treatment.  Verbalizes understanding and is agreeable with plan. Pt is hemodynamically stable & in NAD prior to dc.  Patient was noted to have an elevated blood pressure during their stay in the emergency department. The patient has a history of high blood pressure. The patient has not taken her Amlodipine in 1 week. Treated with Clonidine. Patient observed and BP decreased to comfortably level prior to discharge. Will provide the patient with rx for I suspect the patients blood pressure is moderately elevated due to pain. I advised the patient to discuss this with their PCP during her follow up visit to decide if medication management is needed for this.   Patient case discussed with Dr. Stark Jock who is in agreement with plan.  Final Clinical Impressions(s) / ED Diagnoses   Final diagnoses:  URI with cough and congestion    New Prescriptions Discharge Medication List as of 02/28/2017 11:10 PM    START taking these medications   Details  !! amLODipine (NORVASC) 10 MG tablet Take 1 tablet (10 mg total) by mouth daily., Starting Tue 02/28/2017, Print    fluticasone (FLONASE) 50 MCG/ACT nasal spray Place 2 sprays into both nostrils daily., Starting Tue 02/28/2017, Print    guaiFENesin 200 MG tablet Take 1 tablet (200 mg total) by mouth every 6 (six) hours as needed for cough or to loosen phlegm., Starting Tue 02/28/2017, Print    lidocaine (XYLOCAINE) 2 % solution Use as directed 15 mLs in the mouth or throat as needed for mouth pain., Starting Tue 02/28/2017, Print     !! - Potential duplicate medications found. Please discuss with provider.       Jillyn Ledger, PA-C 03/01/17 1123    Veryl Speak,  MD 03/02/17 1250

## 2017-02-28 NOTE — Discharge Instructions (Addendum)
He was seen here today for cough, sore throat and congestion.  Chest x-ray and strep swab was performed and negative for signs of bacterial infection. A strep culture was sent. If this is to come back positive you will receive a phone call so proper antibiotics can be prescribed.  Read the instructions below on reasons to return to the emergency department and also to learn more about your diagnosis.  Use medications for symptomatic relief as we discussed (Musinex [Guaifenesin] as a decongestant [thin mucus - you have to be well hydrated when taking this for it to work], Tylenol for fever/pain, Motrin/Ibuprofen for muscle aches). You may also have been prescribed a nasal steriod to help with your symptoms. This does not work to maximum capability unless used daily >1-2 weeks. I'll prescribe UA lidocaine mouthwash to be used as needed for your sore throat. Please rinse and gargle with this medication and spit. Do not swallow this medication. Followup with your primary care doctor in 4 days if your symptoms persist.  Your more than welcome to return to the emergency department if symptoms worsen or become concerning.  Upper Respiratory Infection, Adult  An upper respiratory infection (URI) is also sometimes known as the common cold. Most people improve within 1 week, but symptoms can last up to 2 weeks. A residual cough may last even longer.   URI is most commonly caused by a virus. Viruses are NOT treated with antibiotics. You can easily spread the virus to others by oral contact. This includes kissing, sharing a glass, coughing, or sneezing. Touching your mouth or nose and then touching a surface, which is then touched by another person, can also spread the virus.   TREATMENT  Treatment is directed at relieving symptoms. There is no cure. Antibiotics are not effective, because the infection is caused by a virus, not by bacteria. Treatment may include:  Increased fluid intake. Sports drinks offer  valuable electrolytes, sugars, and fluids.  Breathing heated mist or steam (vaporizer or shower).  Eating chicken soup or other clear broths, and maintaining good nutrition.  Getting plenty of rest.  Using gargles or lozenges for comfort.  Controlling fevers with ibuprofen or acetaminophen as directed by your caregiver.  Increasing usage of your inhaler if you have asthma.  Return to work when your temperature has returned to normal.   SEEK MEDICAL CARE IF:  After the first few days, you feel you are getting worse rather than better.  You develop worsening shortness of breath, or brown or red sputum. These may be signs of pneumonia.  You develop yellow or brown nasal discharge or pain in the face, especially when you bend forward. These may be signs of sinusitis.  You develop a fever, swollen neck glands, pain with swallowing, or white areas in the back of your throat. These may be signs of strep throat.   Your vital signs today were: BP (!) 168/114    Pulse (!) 105    Temp 98.6 F (37 C) (Oral)    Resp 20    Ht 5' (1.524 m)    Wt 97.5 kg (215 lb)    LMP 02/25/2017    SpO2 100%    BMI 41.99 kg/m  Your blood pressure was elevated during today's visit. We treated with clonidine in the emergency department. I have refilled your amlodipine prescription. Please pick this up tomorrow morning and take as directed. Please follow-up with your PCP this week in regards to blood pressure.

## 2017-02-28 NOTE — ED Triage Notes (Signed)
Sore throat and cough

## 2017-03-03 LAB — CULTURE, GROUP A STREP (THRC)

## 2017-03-07 ENCOUNTER — Encounter: Payer: Self-pay | Admitting: Medical

## 2017-03-07 ENCOUNTER — Ambulatory Visit (INDEPENDENT_AMBULATORY_CARE_PROVIDER_SITE_OTHER): Payer: 59 | Admitting: Medical

## 2017-03-07 ENCOUNTER — Telehealth: Payer: Self-pay | Admitting: Medical

## 2017-03-07 VITALS — BP 150/90 | HR 103 | Temp 97.8°F | Resp 16 | Ht 60.0 in | Wt 202.6 lb

## 2017-03-07 DIAGNOSIS — E1165 Type 2 diabetes mellitus with hyperglycemia: Secondary | ICD-10-CM | POA: Diagnosis not present

## 2017-03-07 DIAGNOSIS — Z87898 Personal history of other specified conditions: Secondary | ICD-10-CM

## 2017-03-07 DIAGNOSIS — Z8673 Personal history of transient ischemic attack (TIA), and cerebral infarction without residual deficits: Secondary | ICD-10-CM

## 2017-03-07 DIAGNOSIS — I10 Essential (primary) hypertension: Secondary | ICD-10-CM | POA: Diagnosis not present

## 2017-03-07 DIAGNOSIS — J020 Streptococcal pharyngitis: Secondary | ICD-10-CM

## 2017-03-07 DIAGNOSIS — D649 Anemia, unspecified: Secondary | ICD-10-CM | POA: Diagnosis not present

## 2017-03-07 LAB — LIPID PANEL
Cholesterol: 170 mg/dL (ref 0–200)
HDL: 31.4 mg/dL — AB (ref >39.00)
LDL Cholesterol: 116 mg/dL — ABNORMAL HIGH (ref 0–99)
NONHDL: 139.05
Total CHOL/HDL Ratio: 5
Triglycerides: 116 mg/dL (ref 0.0–149.0)
VLDL: 23.2 mg/dL (ref 0.0–40.0)

## 2017-03-07 LAB — CBC WITH DIFFERENTIAL/PLATELET
BASOS ABS: 0.1 10*3/uL (ref 0.0–0.1)
Basophils Relative: 0.7 % (ref 0.0–3.0)
Eosinophils Absolute: 0.1 10*3/uL (ref 0.0–0.7)
Eosinophils Relative: 1 % (ref 0.0–5.0)
HCT: 33.9 % — ABNORMAL LOW (ref 36.0–46.0)
HEMOGLOBIN: 10.7 g/dL — AB (ref 12.0–15.0)
LYMPHS ABS: 1.9 10*3/uL (ref 0.7–4.0)
Lymphocytes Relative: 23.1 % (ref 12.0–46.0)
MCHC: 31.4 g/dL (ref 30.0–36.0)
MCV: 72.9 fl — AB (ref 78.0–100.0)
MONOS PCT: 5.5 % (ref 3.0–12.0)
Monocytes Absolute: 0.4 10*3/uL (ref 0.1–1.0)
NEUTROS PCT: 69.7 % (ref 43.0–77.0)
Neutro Abs: 5.6 10*3/uL (ref 1.4–7.7)
Platelets: 363 10*3/uL (ref 150.0–400.0)
RBC: 4.65 Mil/uL (ref 3.87–5.11)
RDW: 17.8 % — ABNORMAL HIGH (ref 11.5–15.5)
WBC: 8 10*3/uL (ref 4.0–10.5)

## 2017-03-07 LAB — IRON AND TIBC
%SAT: 7 % — AB (ref 11–50)
IRON: 24 ug/dL — AB (ref 40–190)
TIBC: 347 ug/dL (ref 250–450)
UIBC: 323 ug/dL

## 2017-03-07 LAB — COMPLETE METABOLIC PANEL WITH GFR
ALBUMIN: 3.7 g/dL (ref 3.6–5.1)
ALK PHOS: 71 U/L (ref 33–115)
ALT: 12 U/L (ref 6–29)
AST: 12 U/L (ref 10–35)
BILIRUBIN TOTAL: 0.4 mg/dL (ref 0.2–1.2)
BUN: 13 mg/dL (ref 7–25)
CALCIUM: 9.5 mg/dL (ref 8.6–10.2)
CHLORIDE: 96 mmol/L — AB (ref 98–110)
CO2: 28 mmol/L (ref 20–32)
CREATININE: 1.03 mg/dL (ref 0.50–1.10)
GFR, EST AFRICAN AMERICAN: 75 mL/min (ref >=60)
GFR, Est Non African American: 65 mL/min (ref >=60)
Glucose, Bld: 264 mg/dL — ABNORMAL HIGH (ref 65–99)
Potassium: 3.7 mmol/L (ref 3.5–5.3)
Sodium: 134 mmol/L — ABNORMAL LOW (ref 135–146)
TOTAL PROTEIN: 7.9 g/dL (ref 6.1–8.1)

## 2017-03-07 MED ORDER — FERROUS SULFATE 325 (65 FE) MG PO TABS: 325.0000 mg | ORAL_TABLET | Freq: Every day | ORAL | 1 refills | Status: AC

## 2017-03-07 MED ORDER — LOSARTAN POTASSIUM 100 MG PO TABS: 100.0000 mg | ORAL_TABLET | Freq: Every day | ORAL | 0 refills | Status: DC

## 2017-03-07 MED ORDER — AMOXICILLIN 875 MG PO TABS: 875.0000 mg | ORAL_TABLET | Freq: Two times a day (BID) | ORAL | 0 refills | Status: DC
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
## 2017-03-07 NOTE — Patient Instructions (Addendum)
For strep throat rx amoxicillin. Work note to return 24 hour after starting antibiotic.  For history of stroke will try to control risk factors bp and diabetes.   For seizure contineu keppra and follow up with neurologist.  For htn continue current meds but stop not use lisinopril as you have not been on. Will rx losartan.  For diabetes will get a1c and cmp. Advise to start glipizide. But after we get lab results may adjust meds.   Will get fasting lipid panel.  Follow up in 2 weeks or as needed

## 2017-03-07 NOTE — Telephone Encounter (Signed)
Prescription of ferrous sulfate sent to patient's pharmacy.

## 2017-03-08 LAB — HEMOGLOBIN A1C
HEMOGLOBIN A1C: 11.4 % — AB (ref <5.7)
MEAN PLASMA GLUCOSE: 280 mg/dL

## 2017-05-15 ENCOUNTER — Ambulatory Visit: Payer: 59 | Admitting: Neurology

## 2017-07-25 ENCOUNTER — Ambulatory Visit: Payer: 59 | Admitting: Neurology

## 2017-08-27 ENCOUNTER — Encounter (HOSPITAL_BASED_OUTPATIENT_CLINIC_OR_DEPARTMENT_OTHER): Payer: Self-pay | Admitting: *Deleted

## 2017-08-27 ENCOUNTER — Inpatient Hospital Stay (HOSPITAL_BASED_OUTPATIENT_CLINIC_OR_DEPARTMENT_OTHER)
Admission: EM | Admit: 2017-08-27 | Discharge: 2017-09-05 | DRG: 233 | Disposition: A | Discharge status: Home Health | Payer: 59 | Attending: Thoracic Surgery (Cardiothoracic Vascular Surgery) | Admitting: Thoracic Surgery (Cardiothoracic Vascular Surgery)

## 2017-08-27 ENCOUNTER — Other Ambulatory Visit: Payer: Self-pay

## 2017-08-27 ENCOUNTER — Emergency Department (HOSPITAL_BASED_OUTPATIENT_CLINIC_OR_DEPARTMENT_OTHER): Payer: 59

## 2017-08-27 DIAGNOSIS — I214 Non-ST elevation (NSTEMI) myocardial infarction: Secondary | ICD-10-CM | POA: Diagnosis not present

## 2017-08-27 DIAGNOSIS — I110 Hypertensive heart disease with heart failure: Secondary | ICD-10-CM | POA: Diagnosis present

## 2017-08-27 DIAGNOSIS — Z8673 Personal history of transient ischemic attack (TIA), and cerebral infarction without residual deficits: Secondary | ICD-10-CM | POA: Diagnosis not present

## 2017-08-27 DIAGNOSIS — Z7982 Long term (current) use of aspirin: Secondary | ICD-10-CM | POA: Diagnosis not present

## 2017-08-27 DIAGNOSIS — Z79899 Other long term (current) drug therapy: Secondary | ICD-10-CM | POA: Diagnosis not present

## 2017-08-27 DIAGNOSIS — Z9119 Patient's noncompliance with other medical treatment and regimen: Secondary | ICD-10-CM

## 2017-08-27 DIAGNOSIS — I169 Hypertensive crisis, unspecified: Secondary | ICD-10-CM | POA: Diagnosis present

## 2017-08-27 DIAGNOSIS — I5041 Acute combined systolic (congestive) and diastolic (congestive) heart failure: Secondary | ICD-10-CM | POA: Diagnosis present

## 2017-08-27 DIAGNOSIS — D573 Sickle-cell trait: Secondary | ICD-10-CM | POA: Diagnosis present

## 2017-08-27 DIAGNOSIS — D62 Acute posthemorrhagic anemia: Secondary | ICD-10-CM | POA: Diagnosis not present

## 2017-08-27 DIAGNOSIS — F17210 Nicotine dependence, cigarettes, uncomplicated: Secondary | ICD-10-CM | POA: Diagnosis present

## 2017-08-27 DIAGNOSIS — I10 Essential (primary) hypertension: Secondary | ICD-10-CM | POA: Diagnosis not present

## 2017-08-27 DIAGNOSIS — I200 Unstable angina: Secondary | ICD-10-CM

## 2017-08-27 DIAGNOSIS — J9811 Atelectasis: Secondary | ICD-10-CM | POA: Diagnosis not present

## 2017-08-27 DIAGNOSIS — Z951 Presence of aortocoronary bypass graft: Secondary | ICD-10-CM

## 2017-08-27 DIAGNOSIS — I213 ST elevation (STEMI) myocardial infarction of unspecified site: Principal | ICD-10-CM | POA: Diagnosis present

## 2017-08-27 DIAGNOSIS — D509 Iron deficiency anemia, unspecified: Secondary | ICD-10-CM | POA: Diagnosis not present

## 2017-08-27 DIAGNOSIS — I2510 Atherosclerotic heart disease of native coronary artery without angina pectoris: Secondary | ICD-10-CM | POA: Diagnosis not present

## 2017-08-27 DIAGNOSIS — Z9114 Patient's other noncompliance with medication regimen: Secondary | ICD-10-CM | POA: Diagnosis not present

## 2017-08-27 DIAGNOSIS — G4733 Obstructive sleep apnea (adult) (pediatric): Secondary | ICD-10-CM | POA: Diagnosis present

## 2017-08-27 DIAGNOSIS — E669 Obesity, unspecified: Secondary | ICD-10-CM | POA: Diagnosis present

## 2017-08-27 DIAGNOSIS — Z7984 Long term (current) use of oral hypoglycemic drugs: Secondary | ICD-10-CM

## 2017-08-27 DIAGNOSIS — Z720 Tobacco use: Secondary | ICD-10-CM

## 2017-08-27 DIAGNOSIS — I25110 Atherosclerotic heart disease of native coronary artery with unstable angina pectoris: Secondary | ICD-10-CM

## 2017-08-27 DIAGNOSIS — E785 Hyperlipidemia, unspecified: Secondary | ICD-10-CM | POA: Diagnosis present

## 2017-08-27 DIAGNOSIS — Z6841 Body Mass Index (BMI) 40.0 and over, adult: Secondary | ICD-10-CM | POA: Diagnosis not present

## 2017-08-27 DIAGNOSIS — E119 Type 2 diabetes mellitus without complications: Secondary | ICD-10-CM | POA: Diagnosis present

## 2017-08-27 DIAGNOSIS — I255 Ischemic cardiomyopathy: Secondary | ICD-10-CM | POA: Diagnosis present

## 2017-08-27 DIAGNOSIS — E1165 Type 2 diabetes mellitus with hyperglycemia: Secondary | ICD-10-CM

## 2017-08-27 DIAGNOSIS — E118 Type 2 diabetes mellitus with unspecified complications: Secondary | ICD-10-CM | POA: Diagnosis not present

## 2017-08-27 DIAGNOSIS — E876 Hypokalemia: Secondary | ICD-10-CM | POA: Diagnosis not present

## 2017-08-27 DIAGNOSIS — Z01810 Encounter for preprocedural cardiovascular examination: Secondary | ICD-10-CM | POA: Diagnosis not present

## 2017-08-27 LAB — CBC WITH DIFFERENTIAL/PLATELET
Basophils Absolute: 0 10*3/uL (ref 0.0–0.1)
Basophils Relative: 0 %
Eosinophils Absolute: 0 10*3/uL (ref 0.0–0.7)
Eosinophils Relative: 0 %
HEMATOCRIT: 31.3 % — AB (ref 36.0–46.0)
HEMOGLOBIN: 10.2 g/dL — AB (ref 12.0–15.0)
LYMPHS ABS: 2 10*3/uL (ref 0.7–4.0)
Lymphocytes Relative: 23 %
MCH: 24.3 pg — AB (ref 26.0–34.0)
MCHC: 32.6 g/dL (ref 30.0–36.0)
MCV: 74.7 fL — AB (ref 78.0–100.0)
MONOS PCT: 5 %
Monocytes Absolute: 0.5 10*3/uL (ref 0.1–1.0)
NEUTROS ABS: 6 10*3/uL (ref 1.7–7.7)
NEUTROS PCT: 72 %
Platelets: 273 10*3/uL (ref 150–400)
RBC: 4.19 MIL/uL (ref 3.87–5.11)
RDW: 15.8 % — ABNORMAL HIGH (ref 11.5–15.5)
WBC: 8.6 10*3/uL (ref 4.0–10.5)

## 2017-08-27 LAB — TROPONIN I
TROPONIN I: 1.8 ng/mL — AB (ref <0.03)
Troponin I: 0.42 ng/mL (ref <0.03)
Troponin I: 1.27 ng/mL (ref <0.03)

## 2017-08-27 LAB — COMPREHENSIVE METABOLIC PANEL
ALK PHOS: 62 U/L (ref 38–126)
ALT: 19 U/L (ref 14–54)
ANION GAP: 8 (ref 5–15)
AST: 23 U/L (ref 15–41)
Albumin: 3.2 g/dL — ABNORMAL LOW (ref 3.5–5.0)
BILIRUBIN TOTAL: 0.6 mg/dL (ref 0.3–1.2)
BUN: 9 mg/dL (ref 6–20)
CALCIUM: 8.9 mg/dL (ref 8.9–10.3)
CO2: 28 mmol/L (ref 22–32)
Chloride: 98 mmol/L — ABNORMAL LOW (ref 101–111)
Creatinine, Ser: 1.02 mg/dL — ABNORMAL HIGH (ref 0.44–1.00)
GFR calc non Af Amer: >60 mL/min (ref >60)
GLUCOSE: 289 mg/dL — AB (ref 65–99)
Potassium: 2.5 mmol/L — CL (ref 3.5–5.1)
Sodium: 134 mmol/L — ABNORMAL LOW (ref 135–145)
TOTAL PROTEIN: 7.6 g/dL (ref 6.5–8.1)

## 2017-08-27 LAB — GLUCOSE, CAPILLARY
GLUCOSE-CAPILLARY: 283 mg/dL — AB (ref 65–99)
Glucose-Capillary: 300 mg/dL — ABNORMAL HIGH (ref 65–99)

## 2017-08-27 LAB — BASIC METABOLIC PANEL
Anion gap: 9 (ref 5–15)
BUN: 7 mg/dL (ref 6–20)
CALCIUM: 8.8 mg/dL — AB (ref 8.9–10.3)
CO2: 25 mmol/L (ref 22–32)
CREATININE: 0.9 mg/dL (ref 0.44–1.00)
Chloride: 99 mmol/L — ABNORMAL LOW (ref 101–111)
GFR calc Af Amer: >60 mL/min (ref >60)
GFR calc non Af Amer: >60 mL/min (ref >60)
GLUCOSE: 269 mg/dL — AB (ref 65–99)
Potassium: 2.8 mmol/L — ABNORMAL LOW (ref 3.5–5.1)
Sodium: 133 mmol/L — ABNORMAL LOW (ref 135–145)

## 2017-08-27 LAB — MAGNESIUM
MAGNESIUM: 1.7 mg/dL (ref 1.7–2.4)
Magnesium: 1.7 mg/dL (ref 1.7–2.4)

## 2017-08-27 LAB — HEPARIN LEVEL (UNFRACTIONATED): HEPARIN UNFRACTIONATED: 0.2 [IU]/mL — AB (ref 0.30–0.70)

## 2017-08-27 MED ORDER — ONDANSETRON HCL 4 MG/2ML IJ SOLN: 4.0000 mg | Freq: Four times a day (QID) | INTRAMUSCULAR | Status: DC | PRN

## 2017-08-27 MED ORDER — ACETAMINOPHEN 325 MG PO TABS: 650.0000 mg | ORAL_TABLET | ORAL | Status: DC | PRN

## 2017-08-27 MED ORDER — LOSARTAN POTASSIUM 50 MG PO TABS
100.0000 mg | ORAL_TABLET | Freq: Every day | ORAL | Status: DC
  Administered 2017-08-27 – 2017-08-29 (×3): 100 mg via ORAL
  Filled 2017-08-27 (×3): qty 2

## 2017-08-27 MED ORDER — HEPARIN (PORCINE) IN NACL 100-0.45 UNIT/ML-% IJ SOLN
1200.0000 [IU]/h | INTRAMUSCULAR | Status: DC
  Administered 2017-08-27: 950 [IU]/h via INTRAVENOUS
  Administered 2017-08-28: 1200 [IU]/h via INTRAVENOUS
  Filled 2017-08-27 (×2): qty 250

## 2017-08-27 MED ORDER — POTASSIUM CHLORIDE CRYS ER 20 MEQ PO TBCR
40.0000 meq | EXTENDED_RELEASE_TABLET | Freq: Once | ORAL | Status: AC
  Administered 2017-08-27: 40 meq via ORAL
  Filled 2017-08-27: qty 2

## 2017-08-27 MED ORDER — AMLODIPINE BESYLATE 5 MG PO TABS
10.0000 mg | ORAL_TABLET | Freq: Once | ORAL | Status: AC
  Administered 2017-08-27: 10 mg via ORAL
  Filled 2017-08-27: qty 2

## 2017-08-27 MED ORDER — POTASSIUM CHLORIDE 10 MEQ/100ML IV SOLN
10.0000 meq | INTRAVENOUS | Status: AC
  Administered 2017-08-27 (×2): 10 meq via INTRAVENOUS
  Filled 2017-08-27 (×2): qty 100

## 2017-08-27 MED ORDER — INSULIN ASPART 100 UNIT/ML ~~LOC~~ SOLN
0.0000 [IU] | Freq: Three times a day (TID) | SUBCUTANEOUS | Status: DC
  Administered 2017-08-28: 8 [IU] via SUBCUTANEOUS
  Administered 2017-08-28: 3 [IU] via SUBCUTANEOUS
  Administered 2017-08-29: 5 [IU] via SUBCUTANEOUS
  Administered 2017-08-29: 3 [IU] via SUBCUTANEOUS
  Administered 2017-08-29: 8 [IU] via SUBCUTANEOUS
  Administered 2017-08-30: 5 [IU] via SUBCUTANEOUS

## 2017-08-27 MED ORDER — CARVEDILOL 12.5 MG PO TABS
12.5000 mg | ORAL_TABLET | Freq: Two times a day (BID) | ORAL | Status: DC
  Administered 2017-08-27 – 2017-08-29 (×5): 12.5 mg via ORAL
  Filled 2017-08-27 (×5): qty 1

## 2017-08-27 MED ORDER — LABETALOL HCL 5 MG/ML IV SOLN
10.0000 mg | Freq: Once | INTRAVENOUS | Status: AC
  Administered 2017-08-27: 10 mg via INTRAVENOUS
  Filled 2017-08-27: qty 4

## 2017-08-27 MED ORDER — POTASSIUM CHLORIDE CRYS ER 20 MEQ PO TBCR
20.0000 meq | EXTENDED_RELEASE_TABLET | Freq: Once | ORAL | Status: AC
  Administered 2017-08-27: 20 meq via ORAL
  Filled 2017-08-27: qty 1

## 2017-08-27 MED ORDER — ASPIRIN EC 81 MG PO TBEC
81.0000 mg | DELAYED_RELEASE_TABLET | Freq: Every day | ORAL | Status: DC
  Administered 2017-08-28 – 2017-08-29 (×2): 81 mg via ORAL
  Filled 2017-08-27 (×2): qty 1

## 2017-08-27 MED ORDER — HEPARIN BOLUS VIA INFUSION
4000.0000 [IU] | Freq: Once | INTRAVENOUS | Status: AC
  Administered 2017-08-27: 4000 [IU] via INTRAVENOUS

## 2017-08-27 MED ORDER — MAGNESIUM SULFATE 2 GM/50ML IV SOLN
2.0000 g | Freq: Once | INTRAVENOUS | Status: AC
  Administered 2017-08-27: 2 g via INTRAVENOUS
  Filled 2017-08-27: qty 50

## 2017-08-27 MED ORDER — HYDRALAZINE HCL 20 MG/ML IJ SOLN
5.0000 mg | Freq: Four times a day (QID) | INTRAMUSCULAR | Status: DC | PRN
  Administered 2017-08-27 – 2017-08-28 (×2): 5 mg via INTRAVENOUS
  Filled 2017-08-27 (×2): qty 1

## 2017-08-27 MED ORDER — HYDRALAZINE HCL 20 MG/ML IJ SOLN: 10.0000 mg | Freq: Four times a day (QID) | INTRAMUSCULAR | Status: DC | PRN

## 2017-08-27 MED ORDER — LEVETIRACETAM 250 MG PO TABS
750.0000 mg | ORAL_TABLET | Freq: Two times a day (BID) | ORAL | Status: DC
  Administered 2017-08-27 – 2017-08-29 (×5): 750 mg via ORAL
  Filled 2017-08-27 (×6): qty 1

## 2017-08-27 MED ORDER — AMLODIPINE BESYLATE 10 MG PO TABS
10.0000 mg | ORAL_TABLET | Freq: Every day | ORAL | Status: DC
  Administered 2017-08-28 – 2017-08-29 (×2): 10 mg via ORAL
  Filled 2017-08-27 (×2): qty 1

## 2017-08-27 MED ORDER — NITROGLYCERIN 0.4 MG SL SUBL: 0.4000 mg | SUBLINGUAL_TABLET | SUBLINGUAL | Status: DC | PRN

## 2017-08-27 MED ORDER — METOPROLOL TARTRATE 50 MG PO TABS
25.0000 mg | ORAL_TABLET | Freq: Once | ORAL | Status: AC
  Administered 2017-08-27: 25 mg via ORAL
  Filled 2017-08-27: qty 1

## 2017-08-27 MED ORDER — POTASSIUM CHLORIDE CRYS ER 20 MEQ PO TBCR
40.0000 meq | EXTENDED_RELEASE_TABLET | ORAL | Status: AC
  Administered 2017-08-27 (×2): 40 meq via ORAL
  Filled 2017-08-27 (×2): qty 2

## 2017-08-27 NOTE — ED Notes (Signed)
Troponin 0.42 and K+ 2.5 given to ED MD

## 2017-08-27 NOTE — H&P (Signed)
Cardiology History & Physical    Patient ID: Michele [name] MRN: [id number], DOB: 10-05-1970 Date of Encounter: 08/27/2017, 6:45 PM Primary Physician: Minette Brine Primary Cardiologist: Althia Forts  Chief Complaint: chest tightness Reason for Admission: NSTEMI, hypertensive crisis Requesting MD: Winfred Leeds  HPI: Michele [name] is a 47 y.o. female with history of stroke, seizures, hypertension, diabetes, and anemia who presents with intermittent chest tightness and shortness of breath.  For the past several weeks the patient has had intermittent chest tightness and shortness of breath.  This occurs mostly when she tries to sleep at night.  Unclear if this is true orthopnea as she does not feel short of breath lying flat when she is not sleeping.  She does have a history of OSA but does not wear CPAP.  Her weight has been decreasing and she denies new onset edema.  Also states that she has shortness of breath with exertion and is only able to walk halfway across her parking garage before having to stop due to dyspnea.  Also has some chest tightness with this.  This has been going on for months.  Last night she had her typical chest tightness when she tried to go to sleep.  This morning it was still present, and that concerned her.  Therefore she came to the emergency room.   Vitals notable for blood pressure initially 200/104.  Highest blood pressure recorded 228/128.  Labs were notable for potassium 2.5, troponin 0.42 then increasing to 1.27.  She has a history of hypokalemia and was previously on potassium supplementation, but is currently not taking this.   Patient was given amlodipine, metoprolol, and potassium.  Heparin infusion was started for possible ACS.  Of note patient has been out of her blood pressure medications for the past month.  At the time of transfer, the patient appears comfortable and denies any symptoms.  Past Medical History:  Diagnosis Date  . Anemia   . CVA  (cerebral infarction)    right side numbness  . Diabetes mellitus   . Hypertension   . Seizures ([place])   . Stroke [facility])      Surgical History:  Past Surgical History:  Procedure Laterality Date  . btl    . CESAREAN SECTION    . KNEE ARTHROSCOPY    . TUBAL LIGATION       Home Meds: Prior to Admission medications   Medication Sig Start Date End Date Taking? Authorizing Provider  amLODipine (NORVASC) 10 MG tablet Take 1 tablet (10 mg total) by mouth daily. 01/02/17   Melvenia Beam, MD  amLODipine (NORVASC) 10 MG tablet Take 1 tablet (10 mg total) by mouth daily. 02/28/17   Maczis, Barth Kirks, PA-C  amoxicillin (AMOXIL) 875 MG tablet Take 1 tablet (875 mg total) by mouth 2 (two) times daily. 03/07/17   Saguier, Percell Miller, PA-C  aspirin 325 MG tablet Take 1 tablet (325 mg total) by mouth daily. 01/02/17   Melvenia Beam, MD  ferrous sulfate 325 (65 FE) MG tablet Take 1 tablet (325 mg total) by mouth daily with breakfast. 03/07/17   Saguier, Percell Miller, PA-C  fluticasone [facility]) 50 MCG/ACT nasal spray Place 2 sprays into both nostrils daily. 02/28/17   Maczis, Barth Kirks, PA-C  glipiZIDE (GLUCOTROL XL) 10 MG 24 hr tablet Take 20 mg by mouth daily. 02/21/14 02/21/15  [provider]  glucose monitoring kit (FREESTYLE) monitoring kit 1 each by Does not apply route as needed for other. 08/14/13   Eulogio Bear  U, DO  guaiFENesin 200 MG tablet Take 1 tablet (200 mg total) by mouth every 6 (six) hours as needed for cough or to loosen phlegm. 02/28/17   Maczis, Barth Kirks, PA-C  ibuprofen (ADVIL,MOTRIN) 200 MG tablet Take 800 mg by mouth every 6 (six) hours as needed for cramping.    [provider]  levETIRAcetam (KEPPRA) 750 MG tablet Take 1 tablet (750 mg total) by mouth 2 (two) times daily. 01/02/17   Melvenia Beam, MD  lidocaine (XYLOCAINE) 2 % solution Use as directed 15 mLs in the mouth or throat as needed for mouth pain. 02/28/17   Maczis, Barth Kirks, PA-C  losartan (COZAAR) 100 MG  tablet Take 1 tablet (100 mg total) by mouth daily. 03/07/17   Saguier, Percell Miller, PA-C  metFORMIN (GLUCOPHAGE) 1000 MG tablet Take 1,000 mg by mouth daily with breakfast.     [provider]  metoprolol tartrate (LOPRESSOR) 25 MG tablet Take 1 tablet (25 mg total) by mouth 2 (two) times daily. 01/02/17   Melvenia Beam, MD  omeprazole (PRILOSEC) 20 MG capsule Take 1 capsule (20 mg total) by mouth 2 (two) times daily before a meal. 11/25/16 12/09/16  Rodell Perna A, PA-C    Allergies: No Known Allergies  Social History   Socioeconomic History  . Marital status: Married    Spouse name: Montine Circle  . Number of children: Not on file  . Years of education: 39  . Highest education level: Not on file  Social Needs  . Financial resource strain: Not on file  . Food insecurity - worry: Not on file  . Food insecurity - inability: Not on file  . Transportation needs - medical: Not on file  . Transportation needs - non-medical: Not on file  Occupational History  . Occupation: Geneticist, molecular  Tobacco Use  . Smoking status: Current Every Day Smoker    Packs/day: 0.30    Years: 16.00    Pack years: 4.80  . Smokeless tobacco: Never Used  Substance and Sexual Activity  . Alcohol use: Yes  . Drug use: No  . Sexual activity: No  Other Topics Concern  . Not on file  Social History Narrative   Lives with husband and kids   Right-handed   Caffeine use: large cup of coffee Mon through Fri     Family History  Problem Relation Age of Onset  . Cancer Father   . Lupus Mother   . Diabetes Mother   . Rheum arthritis Mother   . Heart disease Mother   . Stroke Mother   . Kidney disease Maternal Grandmother   . Heart attack Maternal Grandfather   . Seizures Neg Hx     Review of Systems: General: negative for chills, fever, night sweats or weight changes.  Cardiovascular: positive for chest tightness and shortness of breath.   No edema, orthopnea, palpitations, paroxysmal nocturnal  dyspnea Dermatological: negative for rash Respiratory: negative for cough or wheezing Urologic: negative for hematuria Abdominal: negative for nausea, vomiting, diarrhea, bright red blood per rectum, melena, or hematemesis Neurologic: negative for visual changes, syncope, or dizziness All other systems reviewed and are otherwise negative except as noted above.  Labs:   Lab Results  Component Value Date   WBC 8.6 08/27/2017   HGB 10.2 (L) 08/27/2017   HCT 31.3 (L) 08/27/2017   MCV 74.7 (L) 08/27/2017   PLT 273 08/27/2017    Recent Labs  Lab 08/27/17 1155  NA 134*  K 2.5*  CL 98*  CO2 28  BUN 9  CREATININE 1.02*  CALCIUM 8.9  PROT 7.6  BILITOT 0.6  ALKPHOS 62  ALT 19  AST 23  GLUCOSE 289*   Recent Labs    08/27/17 1155 08/27/17 1455  TROPONINI 0.42* 1.27*   Lab Results  Component Value Date   CHOL 170 03/07/2017   HDL 31.40 (L) 03/07/2017   LDLCALC 116 (H) 03/07/2017   TRIG 116.0 03/07/2017   No results found for: DDIMER  Radiology/Studies:  Dg Chest 2 View  Result Date: 08/27/2017 CLINICAL DATA:  Acute chest pain for 2 days. EXAM: CHEST  2 VIEW COMPARISON:  02/28/2017 prior chest radiographs FINDINGS: Mild cardiomegaly noted Mild peribronchial thickening is unchanged. There is no evidence of focal airspace disease, pulmonary edema, suspicious pulmonary nodule/mass, pleural effusion, or pneumothorax. No acute bony abnormalities are identified. IMPRESSION: Mild cardiomegaly without evidence of acute cardiopulmonary disease. Electronically Signed   By: Margarette Canada M.D.   On: 08/27/2017 12:37   Wt Readings from Last 3 Encounters:  08/27/17 93 kg (205 lb 0.4 oz)  03/07/17 91.9 kg (202 lb 9.6 oz)  02/28/17 97.5 kg (215 lb)    EKG: Reviewed, sinus rhythm, prolonged QT, no ischemic ST segment changes  Physical Exam: Blood pressure (!) 198/104, pulse 86, temperature 98.1 F (36.7 C), temperature source Oral, resp. rate 18, height 5' (1.524 m), weight 93 kg (205  lb 0.4 oz), last menstrual period 08/17/2017, SpO2 98 %. Body mass index is 40.04 kg/m. General: Well developed, well nourished, in no acute distress. Head: Normocephalic, atraumatic, sclera non-icteric, no xanthomas, nares are without discharge.  Neck: Negative for carotid bruits. JVD possibly elevated, difficult to assess given habitus. Lungs: Clear bilaterally to auscultation without wheezes, rales, or rhonchi. Breathing is unlabored. Heart: RRR with S1 S2. No murmurs, rubs, or gallops appreciated. Abdomen: Soft, non-tender, non-distended with normoactive bowel sounds. No hepatomegaly. No rebound/guarding. No obvious abdominal masses. Msk:  Strength and tone appear normal for age. Extremities: No clubbing or cyanosis. No edema.  Distal pedal pulses are 2+ and equal bilaterally. Neuro: Alert and oriented X 3. No focal deficit. No facial asymmetry. Moves all extremities spontaneously. Psych:  Responds to questions appropriately with a normal affect.    Assessment and Plan   Non-STEMI Chest tightness, dyspnea Intermittent chest tightness and dyspnea with elevated troponin.  Possibly symptoms and troponin elevation are all due to severe hypertension.  However, ACS remains a concern.  Will control hypertension and trend troponin.  Check echo tomorrow.  Will consider catheterization depending on troponin trend and echo results.  Also will do new onset heart failure playing a role.  Cardiomegaly noted on chest x-ray.  Does not appear grossly point overloaded on exam, but may have elevated JVP.  Follow up echo results. -Continue heparin infusion for ACS -Aspirin 81 daily -Consider statin, patient previously had used -Echo ordered for tomorrow -will consider cath, n.p.o. at midnight -Pretension management as below  Uncontrolled hypertension Severely uncontrolled hypertension in the setting of being out of medications for 1 month.  Currently appears well with no symptoms, so we will continue  management with noninvasive cuff and IV and oral medications as needed to get blood pressure under 160. -IV hydralazine 10 mg as needed for systolic greater than 109 -Start carvedilol 12.5, stop home metoprolol -Start losartan 100 (patient not sure if she was ever on this, but is on her med list) -Continue amlodipine 10 mg -No diuretic given hypokalemia -Consider spironolactone  Acute on chronic hypokalemia Potassium 2.5 on admission.  Unclear etiology.  Not on diuretics.  Patient previously on potassium replacement.  Magnesium also low. -Check K and mag, will supplement as needed -She likely needs chronic potassium therapy -Consider spironolactone  Uncontrolled type 2 diabetes mellitus Last A1c in our system August 2018 was 11.4.  Patient on metformin and glipizide at home. -Hold p.o. meds -Sliding scale insulin ordered -Likely needs better glycemic control at home  Tobacco use Smoking 6 cigarettes a day.  Be addressed with her PCP.  History of stroke History of seizures Continue Keppra   Severity of Illness: The appropriate patient status for this patient is INPATIENT. Inpatient status is judged to be reasonable and necessary in order to provide the required intensity of service to ensure the patient's safety. The patient's presenting symptoms, physical exam findings, and initial radiographic and laboratory data in the context of their chronic comorbidities is felt to place them at high risk for further clinical deterioration. Furthermore, it is not anticipated that the patient will be medically stable for discharge from the hospital within 2 midnights of admission. The following factors support the patient status of inpatient.   " The patient's presenting symptoms include chest pain. " The worrisome physical exam findings include hypertension to 220. " The initial radiographic and laboratory data are worrisome because of cardiomegaly. " The chronic co-morbidities include DM,  HTN.   * I certify that at the point of admission it is my clinical judgment that the patient will require inpatient hospital care spanning beyond 2 midnights from the point of admission due to high intensity of service, high risk for further deterioration and high frequency of surveillance required.*    For questions or updates, please contact [place] Please consult [email or web address] for contact info under Cardiology/STEMI.  Liliane Bade, MD 08/27/2017, 6:45 PM

## 2017-08-27 NOTE — Progress Notes (Signed)
[facility]. MD notified for orders to be placed. Telemetry box applied and CCMD notified x 2. Vitals obtained. BP elevated. MD aware. Pt oriented to room and staff. Heparin drip infusing. Will await orders.    Berdine DanceLauren Moffitt BSN, RN

## 2017-08-27 NOTE — ED Triage Notes (Addendum)
Pt c/o cp x 3 days, with SOB. Pt states out of her bp meds x 3 weeks

## 2017-08-27 NOTE — ED Provider Notes (Addendum)
[name]   CSN: [id number] Arrival date & time: 08/27/17  1133     History   Chief Complaint Chief Complaint  Patient presents with  . Chest Pain    HPI Michele [name] is a 47 y.o. female. Complains of anterior chest pain described as tightness onset 2-3 weeks ago.  Symptoms worse with exertion and improved with rest and worse at night.  Associated symptoms include shortness of breath.  She vomited last night no nausea presently.  Presently asymptomatic except mild shortness of breath.  Patient has been noncompliant with her blood pressure medicine for the past 3 weeks that she has run out.  She treated herself with aspirin 325 mg this morning HPI  Past Medical History:  Diagnosis Date  . Anemia   . CVA (cerebral infarction)    right side numbness  . Diabetes mellitus   . Hypertension   . Seizures ([place])   . Stroke [facility])     Patient Active Problem List   Diagnosis Date Noted  . Medically noncompliant 01/02/2017  . Partial motor seizures ([place]) 01/02/2017  . Chronic ischemic multifocal multiple vascular territories stroke 01/02/2017  . Anemia due to other cause   . Blurry vision, right eye 12/06/2014  . HLD (hyperlipidemia) 08/13/2013  . CVA (cerebral infarction) 08/12/2013  . HTN (hypertension) 08/12/2013  . Diabetes mellitus ([place]) 08/12/2013    Past Surgical History:  Procedure Laterality Date  . btl    . CESAREAN SECTION    . KNEE ARTHROSCOPY    . TUBAL LIGATION      OB History    No data available       Home Medications    Prior to Admission medications   Medication Sig Start Date End Date Taking? Authorizing Provider  amLODipine (NORVASC) 10 MG tablet Take 1 tablet (10 mg total) by mouth daily. 01/02/17   Melvenia Beam, MD  amLODipine (NORVASC) 10 MG tablet Take 1 tablet (10 mg total) by mouth daily. 02/28/17   Maczis, Barth Kirks, PA-C  amoxicillin (AMOXIL) 875 MG tablet Take 1 tablet (875 mg total) by  mouth 2 (two) times daily. 03/07/17   Saguier, Percell Miller, PA-C  aspirin 325 MG tablet Take 1 tablet (325 mg total) by mouth daily. 01/02/17   Melvenia Beam, MD  ferrous sulfate 325 (65 FE) MG tablet Take 1 tablet (325 mg total) by mouth daily with breakfast. 03/07/17   Saguier, Percell Miller, PA-C  fluticasone [facility]) 50 MCG/ACT nasal spray Place 2 sprays into both nostrils daily. 02/28/17   Maczis, Barth Kirks, PA-C  glipiZIDE (GLUCOTROL XL) 10 MG 24 hr tablet Take 20 mg by mouth daily. 02/21/14 02/21/15  [provider]  glucose monitoring kit (FREESTYLE) monitoring kit 1 each by Does not apply route as needed for other. 08/14/13   Geradine Girt, DO  guaiFENesin 200 MG tablet Take 1 tablet (200 mg total) by mouth every 6 (six) hours as needed for cough or to loosen phlegm. 02/28/17   Maczis, Barth Kirks, PA-C  ibuprofen (ADVIL,MOTRIN) 200 MG tablet Take 800 mg by mouth every 6 (six) hours as needed for cramping.    [provider]  levETIRAcetam (KEPPRA) 750 MG tablet Take 1 tablet (750 mg total) by mouth 2 (two) times daily. 01/02/17   Melvenia Beam, MD  lidocaine (XYLOCAINE) 2 % solution Use as directed 15 mLs in the mouth or throat as needed for mouth pain. 02/28/17   Maczis, Barth Kirks,  PA-C  losartan (COZAAR) 100 MG tablet Take 1 tablet (100 mg total) by mouth daily. 03/07/17   Saguier, Percell Miller, PA-C  metFORMIN (GLUCOPHAGE) 1000 MG tablet Take 1,000 mg by mouth daily with breakfast.     [provider]  metoprolol tartrate (LOPRESSOR) 25 MG tablet Take 1 tablet (25 mg total) by mouth 2 (two) times daily. 01/02/17   Melvenia Beam, MD  omeprazole (PRILOSEC) 20 MG capsule Take 1 capsule (20 mg total) by mouth 2 (two) times daily before a meal. 11/25/16 12/09/16  Renita Papa, PA-C    Family History Family History  Problem Relation Age of Onset  . Cancer Father   . Lupus Mother   . Diabetes Mother   . Rheum arthritis Mother   . Heart disease Mother   . Stroke Mother   . Kidney  disease Maternal Grandmother   . Heart attack Maternal Grandfather   . Seizures Neg Hx     Social History Social History   Tobacco Use  . Smoking status: Current Every Day Smoker    Packs/day: 0.30    Years: 16.00    Pack years: 4.80  . Smokeless tobacco: Never Used  Substance Use Topics  . Alcohol use: Yes  . Drug use: No     Allergies   Patient has no known allergies.   Review of Systems Review of Systems  Constitutional: Negative.   HENT: Negative.   Respiratory: Positive for shortness of breath.   Cardiovascular: Positive for chest pain.  Gastrointestinal: Positive for vomiting.  Musculoskeletal: Negative.   Skin: Negative.   Allergic/Immunologic: Positive for immunocompromised state.       Diabetic  Neurological: Negative.   Psychiatric/Behavioral: Negative.   All other systems reviewed and are negative.    Physical Exam Updated Vital Signs BP (!) 210/114 (BP Location: Left Arm)   Pulse 88   Temp 97.9 F (36.6 C) (Oral)   Resp 18   Ht 5' (1.524 m)   Wt 93.4 kg (206 lb)   LMP 08/17/2017   SpO2 97%   BMI 40.23 kg/m   Physical Exam  Constitutional: She appears well-developed and well-nourished.  HENT:  Head: Normocephalic and atraumatic.  Eyes: Conjunctivae are normal. Pupils are equal, round, and reactive to light.  Neck: Neck supple. No tracheal deviation present. No thyromegaly present.  Cardiovascular: Normal rate and regular rhythm.  No murmur heard. Pulmonary/Chest: Effort normal and breath sounds normal.  Abdominal: Soft. Bowel sounds are normal. She exhibits no distension. There is no tenderness.  Obese  Musculoskeletal: Normal range of motion. She exhibits no edema or tenderness.  Neurological: She is alert. Coordination normal.  Skin: Skin is warm and dry. No rash noted.  Psychiatric: She has a normal mood and affect.  Nursing note and vitals reviewed.    ED Treatments / Results  Labs (all labs ordered are listed, but only  abnormal results are displayed) Labs Reviewed  CBC WITH DIFFERENTIAL/PLATELET - Abnormal; Notable for the following components:      Result Value   Hemoglobin 10.2 (*)    HCT 31.3 (*)    MCV 74.7 (*)    MCH 24.3 (*)    RDW 15.8 (*)    All other components within normal limits  COMPREHENSIVE METABOLIC PANEL - Abnormal; Notable for the following components:   Sodium 134 (*)    Potassium 2.5 (*)    Chloride 98 (*)    Glucose, Bld 289 (*)    Creatinine, Ser 1.02 (*)  Albumin 3.2 (*)    All other components within normal limits  TROPONIN I - Abnormal; Notable for the following components:   Troponin I 0.42 (*)    All other components within normal limits  MAGNESIUM  Chest x-ray reviewed by me Results for orders placed or performed during the hospital encounter of 08/27/17  CBC with Differential  Result Value Ref Range   WBC 8.6 4.0 - 10.5 K/uL   RBC 4.19 3.87 - 5.11 MIL/uL   Hemoglobin 10.2 (L) 12.0 - 15.0 g/dL   HCT 31.3 (L) 36.0 - 46.0 %   MCV 74.7 (L) 78.0 - 100.0 fL   MCH 24.3 (L) 26.0 - 34.0 pg   MCHC 32.6 30.0 - 36.0 g/dL   RDW 15.8 (H) 11.5 - 15.5 %   Platelets 273 150 - 400 K/uL   Neutrophils Relative % 72 %   Neutro Abs 6.0 1.7 - 7.7 K/uL   Lymphocytes Relative 23 %   Lymphs Abs 2.0 0.7 - 4.0 K/uL   Monocytes Relative 5 %   Monocytes Absolute 0.5 0.1 - 1.0 K/uL   Eosinophils Relative 0 %   Eosinophils Absolute 0.0 0.0 - 0.7 K/uL   Basophils Relative 0 %   Basophils Absolute 0.0 0.0 - 0.1 K/uL  Comprehensive metabolic panel  Result Value Ref Range   Sodium 134 (L) 135 - 145 mmol/L   Potassium 2.5 (LL) 3.5 - 5.1 mmol/L   Chloride 98 (L) 101 - 111 mmol/L   CO2 28 22 - 32 mmol/L   Glucose, Bld 289 (H) 65 - 99 mg/dL   BUN 9 6 - 20 mg/dL   Creatinine, Ser 1.02 (H) 0.44 - 1.00 mg/dL   Calcium 8.9 8.9 - 10.3 mg/dL   Total Protein 7.6 6.5 - 8.1 g/dL   Albumin 3.2 (L) 3.5 - 5.0 g/dL   AST 23 15 - 41 U/L   ALT 19 14 - 54 U/L   Alkaline Phosphatase 62 38 - 126 U/L    Total Bilirubin 0.6 0.3 - 1.2 mg/dL   GFR calc non Af Amer >60 >60 mL/min   GFR calc Af Amer >60 >60 mL/min   Anion gap 8 5 - 15  Troponin I  Result Value Ref Range   Troponin I 0.42 (HH) <0.03 ng/mL  Chest x-ray viewed by me Dg Chest 2 View  Result Date: 08/27/2017 CLINICAL DATA:  Acute chest pain for 2 days. EXAM: CHEST  2 VIEW COMPARISON:  02/28/2017 prior chest radiographs FINDINGS: Mild cardiomegaly noted Mild peribronchial thickening is unchanged. There is no evidence of focal airspace disease, pulmonary edema, suspicious pulmonary nodule/mass, pleural effusion, or pneumothorax. No acute bony abnormalities are identified. IMPRESSION: Mild cardiomegaly without evidence of acute cardiopulmonary disease. Electronically Signed   By: Margarette Canada M.D.   On: 08/27/2017 12:37    EKG  EKG Interpretation  Date/Time:  Sunday August 27 2017 11:55:02 EST Ventricular Rate:  83 PR Interval:    QRS Duration: 92 QT Interval:  385 QTC Calculation: 453 R Axis:   24 Text Interpretation:  Sinus rhythm Minimal ST depression, lateral leads No significant change since last tracing Confirmed by Orlie Dakin [phone]) on 08/27/2017 12:23:54 PM       Radiology Dg Chest 2 View  Result Date: 08/27/2017 CLINICAL DATA:  Acute chest pain for 2 days. EXAM: CHEST  2 VIEW COMPARISON:  02/28/2017 prior chest radiographs FINDINGS: Mild cardiomegaly noted Mild peribronchial thickening is unchanged. There is no evidence of focal airspace  disease, pulmonary edema, suspicious pulmonary nodule/mass, pleural effusion, or pneumothorax. No acute bony abnormalities are identified. IMPRESSION: Mild cardiomegaly without evidence of acute cardiopulmonary disease. Electronically Signed   By: Margarette Canada M.D.   On: 08/27/2017 12:37    Procedures Procedures (including critical care time)  Medications Ordered in ED Medications  amLODipine (NORVASC) tablet 10 mg (not administered)  metoprolol tartrate (LOPRESSOR)  tablet 25 mg (not administered)  potassium chloride SA (K-DUR,KLOR-CON) CR tablet 40 mEq (not administered)     Initial Impression / Assessment and Plan / ED Course  I have reviewed the triage vital signs and the nursing notes.  Pertinent labs & imaging results that were available during my care of the patient were reviewed by me and considered in my medical decision making (see chart for details).   1:20 PM patient remains asymptomatic.  I consulted [facility] cardiology service who will admit patient and accepts patient in transfer.  He requests intravenous heparin.  I have consulted pharmacy to dose heparin. Oral potassium supplementation as well as regularly scheduled doses of Lopressor and Norvasc ordered by me. Heart score equals 6. I counseled patient for 5 minutes on smoking cessation Lab work consistent with anemia.  Anemia is chronic   240 pm remains asymptomatic , pain free Final Clinical Impressions(s) / ED Diagnoses  Diagnosis #1 unstable angina #2 hypokalemia #3 tobacco abuse #4 medication noncompliance Final diagnoses:  None   #5 anemia ED Discharge Orders    None      CRITICAL CARE Performed by: Orlie Dakin Total critical care time: 30 minutes Critical care time was exclusive of separately billable procedures and treating other patients. Critical care was necessary to treat or prevent imminent or life-threatening deterioration. Critical care was time spent personally by me on the following activities: development of treatment plan with patient and/or surrogate as well as nursing, discussions with consultants, evaluation of patient's response to treatment, examination of patient, obtaining history from patient or surrogate, ordering and performing treatments and interventions, ordering and review of laboratory studies, ordering and review of radiographic studies, pulse oximetry and re-evaluation of patient's condition. Orlie Dakin, MD 08/27/17 [place], MD 08/27/17 [place], MD 08/29/17 1116

## 2017-08-27 NOTE — Progress Notes (Signed)
ANTICOAGULATION CONSULT NOTE - Initial Consult  Pharmacy Consult for Heparin Indication: chest pain/ACS  No Known Allergies  Patient Measurements: Height: 5' (152.4 cm) Weight: 206 lb (93.4 kg) IBW/kg (Calculated) : 45.5 Heparin Dosing Weight: 68 kg  Vital Signs: Temp: 97.9 F (36.6 C) (02/10 1141) Temp Source: Oral (02/10 1141) BP: 209/115 (02/10 1300) Pulse Rate: 102 (02/10 1300)  Labs: Recent Labs    08/27/17 1155  HGB 10.2*  HCT 31.3*  PLT 273  CREATININE 1.02*  TROPONINI 0.42*    Estimated Creatinine Clearance: 70.4 mL/min (A) (by C-G formula based on SCr of 1.02 mg/dL (H)).   Medical History: Past Medical History:  Diagnosis Date  . Anemia   . CVA (cerebral infarction)    right side numbness  . Diabetes mellitus   . Hypertension   . Seizures (HCC)   . Stroke [facility](HCC)     Assessment: CC/HPI: CP, SOB, vomiting x 1, ran out of BP meds 3 wks ago. HypoK+ - Troponin 0.42  PMH: anemia, CVA, DM, HTN, HLD, seizures 6/18, CVA 6/18, noncompliance, tobacco  Anticoag: IV heparin for CP. Baseline Hgb 10.2 Plts 273.  Goal of Therapy:  Heparin level 0.3-0.7 units/ml Monitor platelets by anticoagulation protocol: Yes   Plan:  Heparin 4000 unit IV bolus Heparin infusion at 950 units/hr Check heparin level in 6-8 hrs Daily HL and CBC   Milton Sagona S. Merilynn Finlandobertson, PharmD, BCPS Clinical Staff Pharmacist Pager [phone]  Misty Stanleyobertson, Sahith Nurse Stillinger 08/27/2017,1:38 PM

## 2017-08-27 NOTE — ED Notes (Signed)
Report to Leotis ShamesLauren, RN on 4E at [facility].

## 2017-08-27 NOTE — Progress Notes (Signed)
ANTICOAGULATION CONSULT NOTE  Pharmacy Consult for Heparin Indication: chest pain/ACS  No Known Allergies  Patient Measurements: Height: 5' (152.4 cm) Weight: 205 lb 0.4 oz (93 kg) IBW/kg (Calculated) : 45.5 Heparin Dosing Weight: 68 kg  Vital Signs: Temp: 97.9 F (36.6 C) (02/10 2000) Temp Source: Oral (02/10 2000) BP: 183/96 (02/10 2000) Pulse Rate: 93 (02/10 2000)  Labs: Recent Labs    08/27/17 1155 08/27/17 1455 08/27/17 1828 08/27/17 2233  HGB 10.2*  --   --   --   HCT 31.3*  --   --   --   PLT 273  --   --   --   HEPARINUNFRC  --   --   --  0.20*  CREATININE 1.02*  --  0.90  --   TROPONINI 0.42* 1.27* 1.80*  --     Estimated Creatinine Clearance: 79.5 mL/min (by C-G formula based on SCr of 0.9 mg/dL).   Medical History: Past Medical History:  Diagnosis Date  . Anemia   . CVA (cerebral infarction)    right side numbness  . Diabetes mellitus   . Hypertension   . Seizures (HCC)   . Stroke [facility](HCC)     Assessment: CC/HPI: CP, SOB, vomiting x 1, ran out of BP meds 3 wks ago. HypoK+ - Troponin 0.42  PMH: anemia, CVA, DM, HTN, HLD, seizures 6/18, CVA 6/18, noncompliance, tobacco  Anticoag: IV heparin for CP. Baseline Hgb 10.2 Plts 273  Initial heparin level is subtherapeutic. No issues with heparin infusion.  Goal of Therapy:  Heparin level 0.3-0.7 units/ml Monitor platelets by anticoagulation protocol: Yes   Plan:  Increase heparin infusion to 1200 units/hr Daily HL and CBC Check 6 hour level   Baldemar FridayMasters, Tylyn Derwin M 08/27/2017,11:09 PM

## 2017-08-27 NOTE — ED Notes (Signed)
Dr. Ethelda ChickJacubowitz made aware of repeat trp.

## 2017-08-27 NOTE — ED Notes (Signed)
Pt on monitor 

## 2017-08-28 ENCOUNTER — Inpatient Hospital Stay (HOSPITAL_COMMUNITY)
Admission: EM | Disposition: A | Discharge status: Home Health | Payer: Self-pay | Source: Home / Self Care | Attending: Thoracic Surgery (Cardiothoracic Vascular Surgery)

## 2017-08-28 ENCOUNTER — Inpatient Hospital Stay (HOSPITAL_COMMUNITY): Payer: 59

## 2017-08-28 DIAGNOSIS — I2510 Atherosclerotic heart disease of native coronary artery without angina pectoris: Secondary | ICD-10-CM

## 2017-08-28 DIAGNOSIS — I214 Non-ST elevation (NSTEMI) myocardial infarction: Secondary | ICD-10-CM

## 2017-08-28 DIAGNOSIS — D509 Iron deficiency anemia, unspecified: Secondary | ICD-10-CM

## 2017-08-28 DIAGNOSIS — E118 Type 2 diabetes mellitus with unspecified complications: Secondary | ICD-10-CM

## 2017-08-28 DIAGNOSIS — E876 Hypokalemia: Secondary | ICD-10-CM

## 2017-08-28 HISTORY — PX: LEFT HEART CATH AND CORONARY ANGIOGRAPHY: CATH118249

## 2017-08-28 LAB — GLUCOSE, CAPILLARY
GLUCOSE-CAPILLARY: 191 mg/dL — AB (ref 65–99)
Glucose-Capillary: 298 mg/dL — ABNORMAL HIGH (ref 65–99)
Glucose-Capillary: 105 mg/dL — ABNORMAL HIGH (ref 65–99)
Glucose-Capillary: 313 mg/dL — ABNORMAL HIGH (ref 65–99)

## 2017-08-28 LAB — POTASSIUM: Potassium: 3.5 mmol/L (ref 3.5–5.1)

## 2017-08-28 LAB — CBC
HCT: 33.1 % — ABNORMAL LOW (ref 36.0–46.0)
Hemoglobin: 10.6 g/dL — ABNORMAL LOW (ref 12.0–15.0)
MCH: 24.1 pg — ABNORMAL LOW (ref 26.0–34.0)
MCHC: 32 g/dL (ref 30.0–36.0)
MCV: 75.4 fL — AB (ref 78.0–100.0)
Platelets: 282 10*3/uL (ref 150–400)
RBC: 4.39 MIL/uL (ref 3.87–5.11)
RDW: 16.1 % — ABNORMAL HIGH (ref 11.5–15.5)
WBC: 10 10*3/uL (ref 4.0–10.5)

## 2017-08-28 LAB — ECHOCARDIOGRAM COMPLETE
HEIGHTINCHES: 60 in
Weight: 3280.44 oz

## 2017-08-28 LAB — TSH: TSH: 1.144 u[IU]/mL (ref 0.350–4.500)

## 2017-08-28 LAB — BASIC METABOLIC PANEL
Anion gap: 11 (ref 5–15)
BUN: 8 mg/dL (ref 6–20)
CHLORIDE: 101 mmol/L (ref 101–111)
CO2: 23 mmol/L (ref 22–32)
Calcium: 8.9 mg/dL (ref 8.9–10.3)
Creatinine, Ser: 0.95 mg/dL (ref 0.44–1.00)
GFR calc non Af Amer: >60 mL/min (ref >60)
Glucose, Bld: 302 mg/dL — ABNORMAL HIGH (ref 65–99)
Potassium: 3.3 mmol/L — ABNORMAL LOW (ref 3.5–5.1)
SODIUM: 135 mmol/L (ref 135–145)

## 2017-08-28 LAB — PROTIME-INR
INR: 1.03
Prothrombin Time: 13.4 seconds (ref 11.4–15.2)

## 2017-08-28 LAB — HEPARIN LEVEL (UNFRACTIONATED): HEPARIN UNFRACTIONATED: 0.35 [IU]/mL (ref 0.30–0.70)

## 2017-08-28 LAB — HIV ANTIBODY (ROUTINE TESTING W REFLEX): HIV Screen 4th Generation wRfx: NONREACTIVE

## 2017-08-28 LAB — TROPONIN I: Troponin I: 1.83 ng/mL (ref <0.03)

## 2017-08-28 LAB — MAGNESIUM: Magnesium: 2 mg/dL (ref 1.7–2.4)

## 2017-08-28 SURGERY — LEFT HEART CATH AND CORONARY ANGIOGRAPHY
Anesthesia: LOCAL

## 2017-08-28 MED ORDER — HEPARIN (PORCINE) IN NACL 2-0.9 UNIT/ML-% IJ SOLN
INTRAMUSCULAR | Status: AC | PRN
  Administered 2017-08-28: 1000 mL

## 2017-08-28 MED ORDER — FENTANYL CITRATE (PF) 100 MCG/2ML IJ SOLN
INTRAMUSCULAR | Status: DC | PRN
  Administered 2017-08-28: 25 ug via INTRAVENOUS

## 2017-08-28 MED ORDER — ATORVASTATIN CALCIUM 80 MG PO TABS
80.0000 mg | ORAL_TABLET | Freq: Every day | ORAL | Status: DC
  Administered 2017-08-28 – 2017-09-04 (×7): 80 mg via ORAL
  Filled 2017-08-28 (×7): qty 1

## 2017-08-28 MED ORDER — SODIUM CHLORIDE 0.9% FLUSH: 3.0000 mL | INTRAVENOUS | Status: DC | PRN

## 2017-08-28 MED ORDER — HEPARIN SODIUM (PORCINE) 1000 UNIT/ML IJ SOLN
INTRAMUSCULAR | Status: DC | PRN
  Administered 2017-08-28: 4500 [IU] via INTRAVENOUS

## 2017-08-28 MED ORDER — ISOSORBIDE MONONITRATE ER 30 MG PO TB24
30.0000 mg | ORAL_TABLET | Freq: Every day | ORAL | Status: DC
  Administered 2017-08-28 – 2017-08-29 (×2): 30 mg via ORAL
  Filled 2017-08-28 (×2): qty 1

## 2017-08-28 MED ORDER — FENTANYL CITRATE (PF) 100 MCG/2ML IJ SOLN
INTRAMUSCULAR | Status: AC
  Filled 2017-08-28: qty 2

## 2017-08-28 MED ORDER — PNEUMOCOCCAL VAC POLYVALENT 25 MCG/0.5ML IJ INJ: 0.5000 mL | INJECTION | INTRAMUSCULAR | Status: DC | PRN

## 2017-08-28 MED ORDER — HEPARIN (PORCINE) IN NACL 2-0.9 UNIT/ML-% IJ SOLN
INTRAMUSCULAR | Status: AC
  Filled 2017-08-28: qty 1000

## 2017-08-28 MED ORDER — SODIUM CHLORIDE 0.9 % IV SOLN: 250.0000 mL | INTRAVENOUS | Status: DC | PRN

## 2017-08-28 MED ORDER — MIDAZOLAM HCL 2 MG/2ML IJ SOLN
INTRAMUSCULAR | Status: DC | PRN
  Administered 2017-08-28: 1 mg via INTRAVENOUS

## 2017-08-28 MED ORDER — FUROSEMIDE 10 MG/ML IJ SOLN
40.0000 mg | Freq: Two times a day (BID) | INTRAMUSCULAR | Status: DC
  Administered 2017-08-28 – 2017-08-29 (×3): 40 mg via INTRAVENOUS
  Filled 2017-08-28 (×3): qty 4

## 2017-08-28 MED ORDER — IOPAMIDOL (ISOVUE-370) INJECTION 76%
INTRAVENOUS | Status: AC
  Filled 2017-08-28: qty 100

## 2017-08-28 MED ORDER — LIDOCAINE HCL (PF) 1 % IJ SOLN
INTRAMUSCULAR | Status: DC | PRN
  Administered 2017-08-28: 2 mL

## 2017-08-28 MED ORDER — SODIUM CHLORIDE 0.9 % WEIGHT BASED INFUSION: 3.0000 mL/kg/h | INTRAVENOUS | Status: DC

## 2017-08-28 MED ORDER — ASPIRIN 81 MG PO CHEW: 81.0000 mg | CHEWABLE_TABLET | ORAL | Status: DC

## 2017-08-28 MED ORDER — HEPARIN (PORCINE) IN NACL 100-0.45 UNIT/ML-% IJ SOLN
1400.0000 [IU]/h | INTRAMUSCULAR | Status: DC
Start: 2017-08-28 — End: 2017-08-30
  Administered 2017-08-29: 1400 [IU]/h via INTRAVENOUS
  Filled 2017-08-28: qty 250

## 2017-08-28 MED ORDER — HYDRALAZINE HCL 20 MG/ML IJ SOLN
INTRAMUSCULAR | Status: DC | PRN
  Administered 2017-08-28: 10 mg via INTRAVENOUS

## 2017-08-28 MED ORDER — POTASSIUM CHLORIDE CRYS ER 20 MEQ PO TBCR
40.0000 meq | EXTENDED_RELEASE_TABLET | Freq: Once | ORAL | Status: AC
  Administered 2017-08-28: 40 meq via ORAL
  Filled 2017-08-28: qty 2

## 2017-08-28 MED ORDER — LIDOCAINE HCL (PF) 1 % IJ SOLN
INTRAMUSCULAR | Status: AC
  Filled 2017-08-28: qty 30

## 2017-08-28 MED ORDER — SODIUM CHLORIDE 0.9% FLUSH
3.0000 mL | Freq: Two times a day (BID) | INTRAVENOUS | Status: DC
  Administered 2017-08-28 – 2017-08-29 (×2): 3 mL via INTRAVENOUS

## 2017-08-28 MED ORDER — VERAPAMIL HCL 2.5 MG/ML IV SOLN
INTRAVENOUS | Status: AC
  Filled 2017-08-28: qty 2

## 2017-08-28 MED ORDER — HYDRALAZINE HCL 25 MG PO TABS
25.0000 mg | ORAL_TABLET | Freq: Three times a day (TID) | ORAL | Status: DC
  Administered 2017-08-28 – 2017-08-30 (×6): 25 mg via ORAL
  Filled 2017-08-28 (×6): qty 1

## 2017-08-28 MED ORDER — SODIUM CHLORIDE 0.9 % IV SOLN
INTRAVENOUS | Status: DC | PRN
  Administered 2017-08-28: 20 mL/h via INTRAVENOUS

## 2017-08-28 MED ORDER — MIDAZOLAM HCL 2 MG/2ML IJ SOLN
INTRAMUSCULAR | Status: AC
  Filled 2017-08-28: qty 2

## 2017-08-28 MED ORDER — SODIUM CHLORIDE 0.9% FLUSH: 3.0000 mL | Freq: Two times a day (BID) | INTRAVENOUS | Status: DC

## 2017-08-28 MED ORDER — HYDRALAZINE HCL 20 MG/ML IJ SOLN
INTRAMUSCULAR | Status: AC
  Filled 2017-08-28: qty 1

## 2017-08-28 MED ORDER — SODIUM CHLORIDE 0.9 % WEIGHT BASED INFUSION: 1.0000 mL/kg/h | INTRAVENOUS | Status: DC

## 2017-08-28 MED ORDER — LIVING WELL WITH DIABETES BOOK
Freq: Once | Status: AC
  Administered 2017-08-28: 13:00:00
  Filled 2017-08-28: qty 1

## 2017-08-28 MED ORDER — HEPARIN SODIUM (PORCINE) 1000 UNIT/ML IJ SOLN
INTRAMUSCULAR | Status: AC
  Filled 2017-08-28: qty 1

## 2017-08-28 SURGICAL SUPPLY — 10 items
CATH INFINITI 5FR ANG PIGTAIL (CATHETERS) ×2 IMPLANT
CATH OPTITORQUE TIG 4.0 5F (CATHETERS) ×2 IMPLANT
DEVICE RAD COMP TR BAND LRG (VASCULAR PRODUCTS) ×2 IMPLANT
GLIDESHEATH SLEND A-KIT 6F 22G (SHEATH) ×2 IMPLANT
GUIDEWIRE INQWIRE 1.5J.035X260 (WIRE) ×1 IMPLANT
INQWIRE 1.5J .035X260CM (WIRE) ×2
KIT PREMIUM HAND CONTROLLER (KITS) ×2 IMPLANT
KIT SINGLE USE MANIFOLD (KITS) ×2 IMPLANT
PACK CARDIAC CATHETERIZATION (CUSTOM PROCEDURE TRAY) ×2 IMPLANT
WIRE HI TORQ VERSACORE-J 145CM (WIRE) ×2 IMPLANT

## 2017-08-28 NOTE — Progress Notes (Signed)
Inpatient Diabetes Program Recommendations  AACE/ADA: New Consensus Statement on Inpatient Glycemic Control (2015)  Target Ranges:  Prepandial:   less than 140 mg/dL      Peak postprandial:   less than 180 mg/dL (1-2 hours)      Critically ill patients:  140 - 180 mg/dL   Lab Results  Component Value Date   GLUCAP 298 (H) 08/28/2017   HGBA1C 11.4 (H) 03/07/2017    Review of Glycemic ControlResults for Michele [name], Michele [name] (MRN [id number]) as of 08/28/2017 10:37  Ref. Range 08/27/2017 17:16 08/27/2017 20:44 08/28/2017 06:27  Glucose-Capillary Latest Ref Range: 65 - 99 mg/dL 782283 (H) 956300 (H) 213298 (H)    Diabetes history: Type 2 DM Outpatient Diabetes medications: Metformin 1000 mg daily, Glucotrol 20 mg daily Current orders for Inpatient glycemic control:  Novolog moderate tid with meals  Inpatient Diabetes Program Recommendations:    Note elevated blood sugars.  Please add Lantus 18 units daily.  A1C pending however it appears that patient does need insulin at home.  Will talk to patient today to discuss further.   Thanks, Beryl MeagerJenny Zaela Graley, RN, BC-ADM Inpatient Diabetes Coordinator Pager [phone]

## 2017-08-28 NOTE — Interval H&P Note (Signed)
History and Physical Interval Note:  08/28/2017 3:19 PM  Michele [name]  has presented today for cardiac catheterization, with the diagnosis of NSTEMI. The various methods of treatment have been discussed with the patient and family. After consideration of risks, benefits and other options for treatment, the patient has consented to  Procedure(s): LEFT HEART CATH AND CORONARY ANGIOGRAPHY (N/A) as a surgical intervention .  The patient's history has been reviewed, patient examined, no change in status, stable for surgery.  I have reviewed the patient's chart and labs.  Questions were answered to the patient's satisfaction.    Cath Lab Visit (complete for each Cath Lab visit)  Clinical Evaluation Leading to the Procedure:   ACS: Yes.    Non-ACS:  N/A  [name]

## 2017-08-28 NOTE — Progress Notes (Signed)
  Echocardiogram 2D Echocardiogram has been performed.  Janalyn HarderWest, Leandro Berkowitz R 08/28/2017, 3:28 PM

## 2017-08-28 NOTE — H&P (View-Only) (Signed)
Progress Note  Patient Name: Michele [name] Date of Encounter: 08/28/2017  Primary Cardiologist: New  Subjective   No chest pain or dyspnea  Inpatient Medications    Scheduled Meds: . amLODipine  10 mg Oral Daily  . aspirin EC  81 mg Oral Daily  . carvedilol  12.5 mg Oral BID WC  . insulin aspart  0-15 Units Subcutaneous TID WC  . levETIRAcetam  750 mg Oral BID  . losartan  100 mg Oral Daily   Continuous Infusions: . heparin 1,200 Units/hr (08/27/17 2329)   PRN Meds: acetaminophen, hydrALAZINE, nitroGLYCERIN   Vital Signs    Vitals:   08/27/17 2330 08/28/17 0000 08/28/17 0447 08/28/17 0800  BP: (!) 189/92 (!) 168/90 (!) 182/110 (!) 185/77  Pulse:  (!) 101 96 96  Resp:  (!) 25 19   Temp: 98.2 F (36.8 C)  98 F (36.7 C)   TempSrc: Oral  Oral   SpO2:  94% 97%   Weight:      Height:        Intake/Output Summary (Last 24 hours) at 08/28/2017 0826 Last data filed at 08/27/2017 1800 Gross per 24 hour  Intake 40.38 ml  Output -  Net 40.38 ml   Filed Weights   08/27/17 1140 08/27/17 1717  Weight: 206 lb (93.4 kg) 205 lb 0.4 oz (93 kg)    Telemetry    NSR- Personally Reviewed  Physical Exam   GEN: Obese No acute distress.   Neck: No JVD Cardiac: RRR, no murmurs, rubs, or gallops.  Respiratory: Clear to auscultation bilaterally. GI: Soft, nontender, non-distended  MS: No edema Neuro:  Nonfocal  Psych: Normal affect   Labs    Chemistry Recent Labs  Lab 08/27/17 1155 08/27/17 1828 08/28/17 0646  NA 134* 133* 135  K 2.5* 2.8* 3.3*  CL 98* 99* 101  CO2 28 25 23   GLUCOSE 289* 269* 302*  BUN 9 7 8   CREATININE 1.02* 0.90 0.95  CALCIUM 8.9 8.8* 8.9  PROT 7.6  --   --   ALBUMIN 3.2*  --   --   AST 23  --   --   ALT 19  --   --   ALKPHOS 62  --   --   BILITOT 0.6  --   --   GFRNONAA >60 >60 >60  GFRAA >60 >60 >60  ANIONGAP 8 9 11      Hematology Recent Labs  Lab 08/27/17 1155 08/28/17 0646  WBC 8.6 10.0  RBC 4.19 4.39  HGB 10.2* 10.6*   HCT 31.3* 33.1*  MCV 74.7* 75.4*  MCH 24.3* 24.1*  MCHC 32.6 32.0  RDW 15.8* 16.1*  PLT 273 282    Cardiac Enzymes Recent Labs  Lab 08/27/17 1155 08/27/17 1455 08/27/17 1828 08/28/17 0646  TROPONINI 0.42* 1.27* 1.80* 1.83*    Radiology    Dg Chest 2 View  Result Date: 08/27/2017 CLINICAL DATA:  Acute chest pain for 2 days. EXAM: CHEST  2 VIEW COMPARISON:  02/28/2017 prior chest radiographs FINDINGS: Mild cardiomegaly noted Mild peribronchial thickening is unchanged. There is no evidence of focal airspace disease, pulmonary edema, suspicious pulmonary nodule/mass, pleural effusion, or pneumothorax. No acute bony abnormalities are identified. IMPRESSION: Mild cardiomegaly without evidence of acute cardiopulmonary disease. Electronically Signed   By: Harmon PierJeffrey  Hu M.D.   On: 08/27/2017 12:37     Patient Profile     47 y.o. female admitted with hypertensive urgency (not taking meds) and NSTEMI.  Assessment &  Plan    1 non-ST elevation myocardial infarction-patient is pain-free this morning.  Continue aspirin, heparin, carvedilol.  Add Lipitor 80 mg daily.  Plan cardiac catheterization.  The risks and benefits were discussed and patient agrees to proceed.  2 hypertension-blood pressure severely elevated at time of admission but patient not taking medications.  We have resumed.  Follow blood pressure and adjust regimen as needed.  3 hypokalemia-supplement.  Will consider addition of Spironolactone following catheterization.   4 Tobacco abuse-patient counseled on discontinuing.  5 noncompliance-patient counseled on importance of medication compliance.  6 microcytic anemia-patient states this is long-standing.  She still menstruates and also has sickle cell trait.  7 diabetes mellitus-we will need to resume Glucophage 48 hours following procedure.  Follow-up primary care.  For questions or updates, please contact [facility] Please consult [email or web address] for contact info  under Cardiology/STEMI.      Signed, Olga Millers, MD  08/28/2017, 8:26 AM

## 2017-08-28 NOTE — Progress Notes (Signed)
08/28/2017 1630 Received pt back from the cath lab.  Pt is post heart cath per R radial.  R radial site is WNL, no bruising/bleeding noted.  Encouraged pt to keep R arm elevated and not to use.  Tele monitor applied and CCMD notified.   Kathryne HitchAllen, Vaughan Garfinkle C

## 2017-08-28 NOTE — Progress Notes (Signed)
ANTICOAGULATION CONSULT NOTE  Pharmacy Consult for Heparin Indication: chest pain/ACS  No Known Allergies  Patient Measurements: Height: 5' (152.4 cm) Weight: 205 lb 0.4 oz (93 kg) IBW/kg (Calculated) : 45.5 Heparin Dosing Weight: 68 kg  Vital Signs: Temp: 98 F (36.7 C) (02/11 0447) Temp Source: Oral (02/11 0447) BP: 185/77 (02/11 0800) Pulse Rate: 96 (02/11 0800)  Labs: Recent Labs    08/27/17 1155 08/27/17 1455 08/27/17 1828 08/27/17 2233 08/28/17 0646  HGB 10.2*  --   --   --  10.6*  HCT 31.3*  --   --   --  33.1*  PLT 273  --   --   --  282  HEPARINUNFRC  --   --   --  0.20* 0.35  CREATININE 1.02*  --  0.90  --  0.95  TROPONINI 0.42* 1.27* 1.80*  --  1.83*    Estimated Creatinine Clearance: 75.3 mL/min (by C-G formula based on SCr of 0.95 mg/dL).   Medical History: Past Medical History:  Diagnosis Date  . Anemia   . CVA (cerebral infarction)    right side numbness  . Diabetes mellitus   . Hypertension   . Seizures (HCC)   . Stroke [facility](HCC)     Assessment: 46 yom on IV heparin for CP. Not on anticoagulation PTA.  Heparin level therapeutic. CBC stable. No bleed documented  Goal of Therapy:  Heparin level 0.3-0.7 units/ml Monitor platelets by anticoagulation protocol: Yes   Plan:  Continue heparin infusion at 1200 units/hr Monitor daily heparin level and CBC, s/sx bleeding  Babs BertinHaley Khira Cudmore, PharmD, BCPS Clinical Pharmacist Clinical phone for 08/28/2017 until 3:30pm: [phone] If after 3:30pm, please call main pharmacy at: [phone] 08/28/2017 8:38 AM

## 2017-08-28 NOTE — Brief Op Note (Signed)
BRIEF CARDIAC CATHETERIZATION NOTE  DATE: 08/28/2017 TIME: 4:11 PM  PATIENT:  Michele [name]  47 y.o. female  PRE-OPERATIVE DIAGNOSIS:  NSTEMI  POST-OPERATIVE DIAGNOSIS:  NSTEMI  PROCEDURE:  Procedure(s): LEFT HEART CATH AND CORONARY ANGIOGRAPHY (N/A)  SURGEON:  Surgeon(s) and Role:    * Keyontae Huckeby, Cristal Deerhristopher, MD - Primary  FINDINGS: 1. Significant 3-vessel CAD. 2. Moderately reduced LVEF. 3. Moderately to severely elevated LVEDP. 4. Severe systemic hypertension.  RECOMMENDATIONS: 1. Cardiac surgery consultation. 2. Diuresis. 3. Improved blood pressure control. 4. Aggressive secondary prevention.  Michele Kendallhristopher Glena Pharris, MD [facility] Pager: [phone]

## 2017-08-28 NOTE — Progress Notes (Signed)
Progress Note  Patient Name: Michele [name] Date of Encounter: 08/28/2017  Primary Cardiologist: New  Subjective   No chest pain or dyspnea  Inpatient Medications    Scheduled Meds: . amLODipine  10 mg Oral Daily  . aspirin EC  81 mg Oral Daily  . carvedilol  12.5 mg Oral BID WC  . insulin aspart  0-15 Units Subcutaneous TID WC  . levETIRAcetam  750 mg Oral BID  . losartan  100 mg Oral Daily   Continuous Infusions: . heparin 1,200 Units/hr (08/27/17 2329)   PRN Meds: acetaminophen, hydrALAZINE, nitroGLYCERIN   Vital Signs    Vitals:   08/27/17 2330 08/28/17 0000 08/28/17 0447 08/28/17 0800  BP: (!) 189/92 (!) 168/90 (!) 182/110 (!) 185/77  Pulse:  (!) 101 96 96  Resp:  (!) 25 19   Temp: 98.2 F (36.8 C)  98 F (36.7 C)   TempSrc: Oral  Oral   SpO2:  94% 97%   Weight:      Height:        Intake/Output Summary (Last 24 hours) at 08/28/2017 0826 Last data filed at 08/27/2017 1800 Gross per 24 hour  Intake 40.38 ml  Output -  Net 40.38 ml   Filed Weights   08/27/17 1140 08/27/17 1717  Weight: 206 lb (93.4 kg) 205 lb 0.4 oz (93 kg)    Telemetry    NSR- Personally Reviewed  Physical Exam   GEN: Obese No acute distress.   Neck: No JVD Cardiac: RRR, no murmurs, rubs, or gallops.  Respiratory: Clear to auscultation bilaterally. GI: Soft, nontender, non-distended  MS: No edema Neuro:  Nonfocal  Psych: Normal affect   Labs    Chemistry Recent Labs  Lab 08/27/17 1155 08/27/17 1828 08/28/17 0646  NA 134* 133* 135  K 2.5* 2.8* 3.3*  CL 98* 99* 101  CO2 28 25 23   GLUCOSE 289* 269* 302*  BUN 9 7 8   CREATININE 1.02* 0.90 0.95  CALCIUM 8.9 8.8* 8.9  PROT 7.6  --   --   ALBUMIN 3.2*  --   --   AST 23  --   --   ALT 19  --   --   ALKPHOS 62  --   --   BILITOT 0.6  --   --   GFRNONAA >60 >60 >60  GFRAA >60 >60 >60  ANIONGAP 8 9 11      Hematology Recent Labs  Lab 08/27/17 1155 08/28/17 0646  WBC 8.6 10.0  RBC 4.19 4.39  HGB 10.2* 10.6*   HCT 31.3* 33.1*  MCV 74.7* 75.4*  MCH 24.3* 24.1*  MCHC 32.6 32.0  RDW 15.8* 16.1*  PLT 273 282    Cardiac Enzymes Recent Labs  Lab 08/27/17 1155 08/27/17 1455 08/27/17 1828 08/28/17 0646  TROPONINI 0.42* 1.27* 1.80* 1.83*    Radiology    Dg Chest 2 View  Result Date: 08/27/2017 CLINICAL DATA:  Acute chest pain for 2 days. EXAM: CHEST  2 VIEW COMPARISON:  02/28/2017 prior chest radiographs FINDINGS: Mild cardiomegaly noted Mild peribronchial thickening is unchanged. There is no evidence of focal airspace disease, pulmonary edema, suspicious pulmonary nodule/mass, pleural effusion, or pneumothorax. No acute bony abnormalities are identified. IMPRESSION: Mild cardiomegaly without evidence of acute cardiopulmonary disease. Electronically Signed   By: Harmon PierJeffrey  Hu M.D.   On: 08/27/2017 12:37     Patient Profile     47 y.o. female admitted with hypertensive urgency (not taking meds) and NSTEMI.  Assessment &  Plan    1 non-ST elevation myocardial infarction-patient is pain-free this morning.  Continue aspirin, heparin, carvedilol.  Add Lipitor 80 mg daily.  Plan cardiac catheterization.  The risks and benefits were discussed and patient agrees to proceed.  2 hypertension-blood pressure severely elevated at time of admission but patient not taking medications.  We have resumed.  Follow blood pressure and adjust regimen as needed.  3 hypokalemia-supplement.  Will consider addition of Spironolactone following catheterization.   4 Tobacco abuse-patient counseled on discontinuing.  5 noncompliance-patient counseled on importance of medication compliance.  6 microcytic anemia-patient states this is long-standing.  She still menstruates and also has sickle cell trait.  7 diabetes mellitus-we will need to resume Glucophage 48 hours following procedure.  Follow-up primary care.  For questions or updates, please contact [facility] Please consult [email or web address] for contact info  under Cardiology/STEMI.      Signed, Olga Millers, MD  08/28/2017, 8:26 AM

## 2017-08-28 NOTE — Progress Notes (Signed)
ANTICOAGULATION CONSULT NOTE  Pharmacy Consult for Heparin Indication: chest pain/ACS  No Known Allergies  Patient Measurements: Height: 5' (152.4 cm) Weight: 205 lb 0.4 oz (93 kg) IBW/kg (Calculated) : 45.5 Heparin Dosing Weight: 68 kg  Vital Signs: Temp: 98.1 F (36.7 C) (02/11 1632) Temp Source: Oral (02/11 1632) BP: 198/94 (02/11 1742) Pulse Rate: 99 (02/11 1742)  Labs: Recent Labs    08/27/17 1155 08/27/17 1455 08/27/17 1828 08/27/17 2233 08/28/17 0646 08/28/17 1142  HGB 10.2*  --   --   --  10.6*  --   HCT 31.3*  --   --   --  33.1*  --   PLT 273  --   --   --  282  --   LABPROT  --   --   --   --   --  13.4  INR  --   --   --   --   --  1.03  HEPARINUNFRC  --   --   --  0.20* 0.35  --   CREATININE 1.02*  --  0.90  --  0.95  --   TROPONINI 0.42* 1.27* 1.80*  --  1.83*  --     Estimated Creatinine Clearance: 75.3 mL/min (by C-G formula based on SCr of 0.95 mg/dL).   Medical History: Past Medical History:  Diagnosis Date  . Anemia   . CVA (cerebral infarction)    right side numbness  . Diabetes mellitus   . Hypertension   . Seizures (HCC)   . Stroke [facility](HCC)     Assessment: Michele [name] on IV heparin for ACS, s/p cath, with 3V CAD, pending CVTS consult for CABG. Pharmacy is consulted to resume heparin 4 hrs after TR band removal. per RN, will remove TR band at around 1930.   Heparin level therapeutic on 1200 units/hr prior to cath this morning  Goal of Therapy:  Heparin level 0.3-0.7 units/ml Monitor platelets by anticoagulation protocol: Yes   Plan:  Restart heparin 1200 units/hr at 2300 without bolus F/u AM heparin level and CBC F/u plans for surgery  Bayard HuggerMei Estanislao Harmon, PharmD, BCPS  Clinical Pharmacist  Pager: [phone]   08/28/2017 6:47 PM

## 2017-08-29 ENCOUNTER — Inpatient Hospital Stay (HOSPITAL_COMMUNITY): Payer: 59

## 2017-08-29 ENCOUNTER — Encounter (HOSPITAL_COMMUNITY): Payer: Self-pay | Admitting: Internal Medicine

## 2017-08-29 ENCOUNTER — Other Ambulatory Visit: Payer: Self-pay | Admitting: *Deleted

## 2017-08-29 DIAGNOSIS — I25110 Atherosclerotic heart disease of native coronary artery with unstable angina pectoris: Secondary | ICD-10-CM

## 2017-08-29 DIAGNOSIS — Z01810 Encounter for preprocedural cardiovascular examination: Secondary | ICD-10-CM

## 2017-08-29 DIAGNOSIS — I2510 Atherosclerotic heart disease of native coronary artery without angina pectoris: Secondary | ICD-10-CM

## 2017-08-29 LAB — PULMONARY FUNCTION TEST
DL/VA % PRED: 104 %
DL/VA: 4.45 ml/min/mmHg/L
DLCO COR % PRED: 69 %
DLCO cor: 13.05 ml/min/mmHg
DLCO unc % pred: 60 %
DLCO unc: 11.44 ml/min/mmHg
FEF 25-75 POST: 1.27 L/s
FEF 25-75 Pre: 2 L/sec
FEF2575-%CHANGE-POST: -36 %
FEF2575-%PRED-POST: 55 %
FEF2575-%Pred-Pre: 87 %
FEV1-%CHANGE-POST: -4 %
FEV1-%PRED-POST: 68 %
FEV1-%Pred-Pre: 71 %
FEV1-PRE: 1.47 L
FEV1-Post: 1.4 L
FEV1FVC-%CHANGE-POST: -11 %
FEV1FVC-%PRED-PRE: 100 %
FEV6-%Change-Post: 8 %
FEV6-%PRED-POST: 77 %
FEV6-%Pred-Pre: 71 %
FEV6-PRE: 1.75 L
FEV6-Post: 1.9 L
FEV6FVC-%CHANGE-POST: 0 %
FEV6FVC-%PRED-PRE: 103 %
FEV6FVC-%Pred-Post: 102 %
FVC-%CHANGE-POST: 7 %
FVC-%PRED-POST: 75 %
FVC-%PRED-PRE: 70 %
FVC-POST: 1.91 L
FVC-PRE: 1.78 L
POST FEV1/FVC RATIO: 73 %
PRE FEV6/FVC RATIO: 100 %
Post FEV6/FVC ratio: 99 %
Pre FEV1/FVC ratio: 83 %
RV % PRED: 97 %
RV: 1.48 L
TLC % pred: 75 %
TLC: 3.35 L

## 2017-08-29 LAB — URINALYSIS, COMPLETE (UACMP) WITH MICROSCOPIC
Bacteria, UA: NONE SEEN
Bilirubin Urine: NEGATIVE
GLUCOSE, UA: NEGATIVE mg/dL
Hgb urine dipstick: NEGATIVE
Ketones, ur: NEGATIVE mg/dL
Leukocytes, UA: NEGATIVE
NITRITE: NEGATIVE
PH: 7 (ref 5.0–8.0)
Protein, ur: 30 mg/dL — AB
SPECIFIC GRAVITY, URINE: 1.011 (ref 1.005–1.030)

## 2017-08-29 LAB — GLUCOSE, CAPILLARY
GLUCOSE-CAPILLARY: 217 mg/dL — AB (ref 65–99)
GLUCOSE-CAPILLARY: 232 mg/dL — AB (ref 65–99)
GLUCOSE-CAPILLARY: 186 mg/dL — AB (ref 65–99)
Glucose-Capillary: 189 mg/dL — ABNORMAL HIGH (ref 65–99)
Glucose-Capillary: 276 mg/dL — ABNORMAL HIGH (ref 65–99)

## 2017-08-29 LAB — BASIC METABOLIC PANEL
Anion gap: 10 (ref 5–15)
BUN: 9 mg/dL (ref 6–20)
CHLORIDE: 102 mmol/L (ref 101–111)
CO2: 23 mmol/L (ref 22–32)
CREATININE: 1.01 mg/dL — AB (ref 0.44–1.00)
Calcium: 8.6 mg/dL — ABNORMAL LOW (ref 8.9–10.3)
Glucose, Bld: 212 mg/dL — ABNORMAL HIGH (ref 65–99)
POTASSIUM: 3.1 mmol/L — AB (ref 3.5–5.1)
SODIUM: 135 mmol/L (ref 135–145)

## 2017-08-29 LAB — BLOOD GAS, ARTERIAL
Acid-Base Excess: 3.1 mmol/L — ABNORMAL HIGH (ref 0.0–2.0)
Bicarbonate: 26.1 mmol/L (ref 20.0–28.0)
Drawn by: 441661
FIO2: 0.21
O2 Saturation: 96 %
PCO2 ART: 33.1 mmHg (ref 32.0–48.0)
PH ART: 7.508 — AB (ref 7.350–7.450)
PO2 ART: 81.4 mmHg — AB (ref 83.0–108.0)
Patient temperature: 98.6

## 2017-08-29 LAB — HEMOGLOBIN A1C
Hgb A1c MFr Bld: 10.6 % — ABNORMAL HIGH (ref 4.8–5.6)
Hgb A1c MFr Bld: 10.6 % — ABNORMAL HIGH (ref 4.8–5.6)
MEAN PLASMA GLUCOSE: 257.52 mg/dL
Mean Plasma Glucose: 257.52 mg/dL

## 2017-08-29 LAB — CBC
HEMATOCRIT: 31.6 % — AB (ref 36.0–46.0)
Hemoglobin: 10 g/dL — ABNORMAL LOW (ref 12.0–15.0)
MCH: 23.8 pg — ABNORMAL LOW (ref 26.0–34.0)
MCHC: 31.6 g/dL (ref 30.0–36.0)
MCV: 75.2 fL — AB (ref 78.0–100.0)
Platelets: 283 10*3/uL (ref 150–400)
RBC: 4.2 MIL/uL (ref 3.87–5.11)
RDW: 16.3 % — ABNORMAL HIGH (ref 11.5–15.5)
WBC: 9 10*3/uL (ref 4.0–10.5)

## 2017-08-29 LAB — PROTIME-INR
INR: 1.02
PROTHROMBIN TIME: 13.3 s (ref 11.4–15.2)

## 2017-08-29 LAB — ABO/RH: ABO/RH(D): B POS

## 2017-08-29 LAB — HEPARIN LEVEL (UNFRACTIONATED): Heparin Unfractionated: 0.23 IU/mL — ABNORMAL LOW (ref 0.30–0.70)

## 2017-08-29 MED ORDER — EPINEPHRINE PF 1 MG/ML IJ SOLN
0.0000 ug/min | INTRAVENOUS | Status: DC
  Filled 2017-08-29: qty 4

## 2017-08-29 MED ORDER — PLASMA-LYTE 148 IV SOLN
INTRAVENOUS | Status: AC
  Administered 2017-08-30: 500 mL
  Filled 2017-08-29: qty 2.5

## 2017-08-29 MED ORDER — SODIUM CHLORIDE 0.9 % IV SOLN
750.0000 mg | INTRAVENOUS | Status: DC
  Filled 2017-08-29: qty 750

## 2017-08-29 MED ORDER — POTASSIUM CHLORIDE 2 MEQ/ML IV SOLN
80.0000 meq | INTRAVENOUS | Status: DC
  Filled 2017-08-29: qty 40

## 2017-08-29 MED ORDER — ALBUTEROL SULFATE (2.5 MG/3ML) 0.083% IN NEBU
2.5000 mg | INHALATION_SOLUTION | Freq: Once | RESPIRATORY_TRACT | Status: AC
  Administered 2017-08-29: 2.5 mg via RESPIRATORY_TRACT

## 2017-08-29 MED ORDER — TRANEXAMIC ACID 1000 MG/10ML IV SOLN
1.5000 mg/kg/h | INTRAVENOUS | Status: AC
  Administered 2017-08-30: 1.5 mg/kg/h via INTRAVENOUS
  Filled 2017-08-29: qty 25

## 2017-08-29 MED ORDER — BISACODYL 5 MG PO TBEC
5.0000 mg | DELAYED_RELEASE_TABLET | Freq: Once | ORAL | Status: AC
  Administered 2017-08-29: 5 mg via ORAL
  Filled 2017-08-29: qty 1

## 2017-08-29 MED ORDER — POTASSIUM CHLORIDE CRYS ER 20 MEQ PO TBCR
40.0000 meq | EXTENDED_RELEASE_TABLET | Freq: Once | ORAL | Status: AC
  Administered 2017-08-29: 40 meq via ORAL
  Filled 2017-08-29: qty 2

## 2017-08-29 MED ORDER — SODIUM CHLORIDE 0.9 % IV SOLN
INTRAVENOUS | Status: DC
  Filled 2017-08-29: qty 30

## 2017-08-29 MED ORDER — SODIUM CHLORIDE 0.9 % IV SOLN
1.5000 g | INTRAVENOUS | Status: AC
  Administered 2017-08-30: 1.5 g via INTRAVENOUS
  Filled 2017-08-29: qty 1.5

## 2017-08-29 MED ORDER — METOPROLOL TARTRATE 12.5 MG HALF TABLET
12.5000 mg | ORAL_TABLET | Freq: Once | ORAL | Status: AC
  Administered 2017-08-30: 12.5 mg via ORAL
  Filled 2017-08-29: qty 1

## 2017-08-29 MED ORDER — TRANEXAMIC ACID (OHS) BOLUS VIA INFUSION
15.0000 mg/kg | INTRAVENOUS | Status: AC
  Administered 2017-08-30: 1395 mg via INTRAVENOUS
  Filled 2017-08-29: qty 1395

## 2017-08-29 MED ORDER — TEMAZEPAM 7.5 MG PO CAPS
15.0000 mg | ORAL_CAPSULE | Freq: Once | ORAL | Status: AC | PRN
  Administered 2017-08-29: 15 mg via ORAL
  Filled 2017-08-29: qty 2

## 2017-08-29 MED ORDER — SPIRONOLACTONE 25 MG PO TABS
25.0000 mg | ORAL_TABLET | Freq: Every day | ORAL | Status: DC
  Administered 2017-08-29: 25 mg via ORAL
  Filled 2017-08-29: qty 1

## 2017-08-29 MED ORDER — CHLORHEXIDINE GLUCONATE CLOTH 2 % EX PADS
6.0000 | MEDICATED_PAD | Freq: Once | CUTANEOUS | Status: AC
  Administered 2017-08-29: 6 via TOPICAL

## 2017-08-29 MED ORDER — CHLORHEXIDINE GLUCONATE 0.12 % MT SOLN
15.0000 mL | Freq: Once | OROMUCOSAL | Status: AC
  Administered 2017-08-30: 15 mL via OROMUCOSAL
  Filled 2017-08-29: qty 15

## 2017-08-29 MED ORDER — VANCOMYCIN HCL 10 G IV SOLR
1500.0000 mg | INTRAVENOUS | Status: AC
Start: 2017-08-29 — End: 2017-08-30
  Administered 2017-08-30: 1500 mg via INTRAVENOUS
  Filled 2017-08-29: qty 1500

## 2017-08-29 MED ORDER — SODIUM CHLORIDE 0.9 % IV SOLN
30.0000 ug/min | INTRAVENOUS | Status: DC
  Filled 2017-08-29: qty 2

## 2017-08-29 MED ORDER — DOPAMINE-DEXTROSE 3.2-5 MG/ML-% IV SOLN
0.0000 ug/kg/min | INTRAVENOUS | Status: DC
  Filled 2017-08-29: qty 250

## 2017-08-29 MED ORDER — DEXMEDETOMIDINE HCL IN NACL 400 MCG/100ML IV SOLN
0.1000 ug/kg/h | INTRAVENOUS | Status: DC
  Filled 2017-08-29: qty 100

## 2017-08-29 MED ORDER — TRANEXAMIC ACID (OHS) PUMP PRIME SOLUTION
2.0000 mg/kg | INTRAVENOUS | Status: DC
  Filled 2017-08-29: qty 1.86

## 2017-08-29 MED ORDER — MAGNESIUM SULFATE 50 % IJ SOLN
40.0000 meq | INTRAMUSCULAR | Status: DC
  Filled 2017-08-29 (×2): qty 9.85

## 2017-08-29 MED ORDER — CHLORHEXIDINE GLUCONATE CLOTH 2 % EX PADS
6.0000 | MEDICATED_PAD | Freq: Once | CUTANEOUS | Status: AC
  Administered 2017-08-30: 6 via TOPICAL

## 2017-08-29 MED ORDER — SODIUM CHLORIDE 0.9 % IV SOLN
INTRAVENOUS | Status: DC
  Filled 2017-08-29: qty 1

## 2017-08-29 MED ORDER — NITROGLYCERIN IN D5W 200-5 MCG/ML-% IV SOLN
2.0000 ug/min | INTRAVENOUS | Status: DC
  Filled 2017-08-29: qty 250

## 2017-08-29 MED ORDER — DIAZEPAM 5 MG PO TABS
5.0000 mg | ORAL_TABLET | Freq: Once | ORAL | Status: AC
  Administered 2017-08-30: 5 mg via ORAL
  Filled 2017-08-29: qty 1

## 2017-08-29 MED FILL — Verapamil HCl IV Soln 2.5 MG/ML: INTRAVENOUS | Qty: 2 | Status: AC

## 2017-08-29 MED FILL — Heparin Sodium (Porcine) 2 Unit/ML in Sodium Chloride 0.9%: INTRAMUSCULAR | Qty: 1000 | Status: AC

## 2017-08-29 NOTE — H&P (View-Only) (Signed)
[address]       [place] [address]             [phone]        Michele [name] [place] Medical Record #[id number] Date of Birth: July 02, 1971  Referring: Dr. Stanford Breed Primary Care: Minette Brine Primary Cardiologist: new to Dr. Stanford Breed  Chief Complaint:    Chief Complaint  Patient presents with  . Chest Pain, shortness of breath   Reason for consultation: Coronary artery disease  History of Present Illness:     This is a 40 year with a history of hypertension, stroke, seizure, tobacco abuse who presented to [facility] on 08/27/2017 with a chief complaint of intermittent chest tightness and shortness of breath. She did have nausea on with at least one episode. Her symptoms are worse with exertion and improve with rest and symptoms appear to be worse at night. She denies pain radiation to jaw or arm, diaphoresis. She did state that she has not taken blood pressure medication in almost 3 weeks as she "ran out".  EKG showed sinus rhythm, minimal ST depression in lateral leads. Her initial Troponin I was 0.42 and went up to 1.83. She ruled in for a NSTEMI. Echo done yesterday showed LVEF 35-40%, diffuse hypokinesis, and trivial MR. She underwent cardiac catheterization yesterday. Results showed significant 3 vessel disease, LVEF  40%. Dr. Roxan Hockey has been consulted regarding consideration of coronary artery bypass grafting surgery. Currently, vital signs are stable and she denies chest pain. She is on a Heparin drip.  Current Activity/ Functional Status: Patient is independent with mobility/ambulation, transfers, ADL's, IADL's.   Zubrod Score: At the time of surgery this patient's most appropriate activity status/level should be described as: []    0    Normal activity, no symptoms [x]    1    Restricted in physical strenuous activity but ambulatory, able to do out light work []    2    Ambulatory and capable of self care, unable to do work activities,  up and about more than 50%  Of the time                []    3    Only limited self care, in bed greater than 50% of waking hours []    4    Completely disabled, no self care, confined to bed or chair []    5    Moribund  Past Medical History:  Diagnosis Date  . Anemia   . CVA (cerebral infarction)    right side numbness  . Diabetes mellitus   . Hypertension   . Seizures ([place])   . Stroke [facility])     Past Surgical History:  Procedure Laterality Date  . btl    . CESAREAN SECTION    . KNEE ARTHROSCOPY    . LEFT HEART CATH AND CORONARY ANGIOGRAPHY N/A 08/28/2017   Procedure: LEFT HEART CATH AND CORONARY ANGIOGRAPHY;  Surgeon: Nelva Bush, MD;  Location: [facility];  Service: Cardiovascular;  Laterality: N/A;  . TUBAL LIGATION        Social History   Socioeconomic History  . Marital status: Married    Spouse name: Montine Circle  . Number of children: Not on file  . Years of education: 51  . Highest education level: Not on file  Social Needs  . Financial resource strain: Not on file  . Food insecurity - worry: Not on  file  . Food insecurity - inability: Not on file  . Transportation needs - medical: Not on file  . Transportation needs - non-medical: Not on file  Occupational History  . Occupation: Geneticist, molecular  Tobacco Use  . Smoking status: Current Every Day Smoker    Packs/day: 0.30    Years: 16.00    Pack years: 4.80  . Smokeless tobacco: Never Used  Substance and Sexual Activity  . Alcohol use: Yes  . Drug use: Marijuana  . Sexual activity: No  Social History Narrative   Lives with husband and kids   Right-handed   Caffeine use: large cup of coffee Mon through Fri   Allergies: No Known Allergies  Current Facility-Administered Medications  Medication Dose Route Frequency Provider Last Rate Last Dose  . 0.9 %  sodium chloride infusion  250 mL Intravenous PRN End, Harrell Gave, MD      . acetaminophen (TYLENOL) tablet 650 mg  650 mg Oral Q4H  PRN Chriss Czar, MD      . amLODipine (NORVASC) tablet 10 mg  10 mg Oral Daily Chriss Czar, MD   10 mg at 08/29/17 0737  . aspirin EC tablet 81 mg  81 mg Oral Daily Chriss Czar, MD   81 mg at 08/29/17 1062  . atorvastatin (LIPITOR) tablet 80 mg  80 mg Oral q1800 Lelon Perla, MD   80 mg at 08/28/17 1742  . carvedilol (COREG) tablet 12.5 mg  12.5 mg Oral BID WC Chriss Czar, MD   12.5 mg at 08/29/17 6948  . furosemide (LASIX) injection 40 mg  40 mg Intravenous BID End, Christopher, MD   40 mg at 08/29/17 0815  . heparin ADULT infusion 100 units/mL (25000 units/240m sodium chloride 0.45%)  1,350 Units/hr Intravenous Continuous [facility]vel [facility]hr at 08/28/17 2320 1,200 Units/hr at 08/28/17 2320  . hydrALAZINE (APRESOLINE) injection 5 mg  5 mg Intravenous Q6H PRN BChriss Czar MD   5 mg at 08/28/17 0549  . hydrALAZINE (APRESOLINE) tablet 25 mg  25 mg Oral Q8H End, Christopher, MD   25 mg at 08/29/17 0511  . insulin aspart (novoLOG) injection 0-15 Units  0-15 Units Subcutaneous TID WC BChriss Czar MD   3 Units at 08/29/17 1241  . isosorbide mononitrate (IMDUR) 24 hr tablet 30 mg  30 mg Oral Daily End, Christopher, MD   30 mg at 08/29/17 0814  . levETIRAcetam (KEPPRA) tablet 750 mg  750 mg Oral BID BChriss Czar MD   750 mg at 08/29/17 0815  . losartan (COZAAR) tablet 100 mg  100 mg Oral Daily BChriss Czar MD   100 mg at 08/29/17 05462 . nitroGLYCERIN (NITROSTAT) SL tablet 0.4 mg  0.4 mg Sublingual Q5 Min x 3 PRN BChriss Czar MD      . pneumococcal 23 valent vaccine (PNU-IMMUNE) injection 0.5 mL  0.5 mL Intramuscular Prior to discharge CLelon Perla MD      . sodium chloride flush (NS) 0.9 % injection 3 mL  3 mL Intravenous Q12H End, Christopher, MD   3 mL at 08/28/17 2104  . sodium chloride flush (NS) 0.9 % injection 3 mL  3 mL Intravenous PRN End, Christopher, MD      . spironolactone (ALDACTONE) tablet 25 mg  25 mg Oral Daily CLelon Perla  MD   25 mg at 08/29/17 1001    Medications Prior to Admission  Medication Sig Dispense Refill  Last Dose  . amLODipine (NORVASC) 10 MG tablet Take 1 tablet (10 mg total) by mouth daily. 30 tablet 11 Past Month at Unknown time  . aspirin 325 MG tablet Take 1 tablet (325 mg total) by mouth daily.   08/26/2017  . calcium carbonate (TUMS EX) 750 MG chewable tablet Chew 2 tablets by mouth as needed for heartburn.   Past Week at Unknown time  . ferrous sulfate 325 (65 FE) MG tablet Take 1 tablet (325 mg total) by mouth daily with breakfast. 30 tablet 1 Past Week at Unknown time  . fluticasone (FLONASE) 50 MCG/ACT nasal spray Place 2 sprays into both nostrils daily. (Patient taking differently: Place 2 sprays into both nostrils as needed for allergies or rhinitis. ) 16 g 0 unk at prn  . glipiZIDE (GLUCOTROL XL) 10 MG 24 hr tablet Take 20 mg by mouth daily.   Past Week at Unknown time  . guaiFENesin 200 MG tablet Take 1 tablet (200 mg total) by mouth every 6 (six) hours as needed for cough or to loosen phlegm. 30 tablet 0 unk at prn  . ibuprofen (ADVIL,MOTRIN) 200 MG tablet Take 800 mg by mouth every 6 (six) hours as needed for cramping.   unk at prn  . levETIRAcetam (KEPPRA) 750 MG tablet Take 1 tablet (750 mg total) by mouth 2 (two) times daily. 60 tablet 11 08/27/2017 at Unknown time  . metFORMIN (GLUCOPHAGE) 1000 MG tablet Take 1,000 mg by mouth daily with breakfast.    Past Month at Unknown time  . metoprolol tartrate (LOPRESSOR) 25 MG tablet Take 1 tablet (25 mg total) by mouth 2 (two) times daily. 60 tablet 11 Past Month at Unknown time  . glucose monitoring kit (FREESTYLE) monitoring kit 1 each by Does not apply route as needed for other. 1 each 0 Taking  . lidocaine (XYLOCAINE) 2 % solution Use as directed 15 mLs in the mouth or throat as needed for mouth pain. (Patient not taking: Reported on 08/28/2017) 100 mL 0 Not Taking at Unknown time  . losartan (COZAAR) 100 MG tablet Take 1 tablet (100 mg  total) by mouth daily. (Patient not taking: Reported on 08/28/2017) 90 tablet 0 Not Taking at Unknown time  . omeprazole (PRILOSEC) 20 MG capsule Take 1 capsule (20 mg total) by mouth 2 (two) times daily before a meal. 28 capsule 0     Family History  Problem Relation Age of Onset  . Cancer Father   . Lupus Mother   . Diabetes Mother   . Rheum arthritis Mother   . Heart disease Mother   . Stroke Mother   . Kidney disease Maternal Grandmother   . Heart attack Maternal Grandfather   . Seizures Neg Hx     Review of Systems:               Cardiac Review of Systems: [Y]=             YES or  [ N   ]= NO  Chest Pain [ Y   ]  Resting SOB [ N  ] Exertional SOB  [Y  ]  Orthopnea Aqua.Slicker  ]  Pedal Edema [ N  ]    Palpitations Aqua.Slicker  ] Syncope  [ N]   Presyncope [ N  ]  General Review of Systems: [Y]             = YES or  [ N ]=no Constitional:  nausea [ Y ]; night  sweats Aqua.Slicker  ]; fever [ N ]; or chills [ N ]                                                     Eye : diplopia [  N ];  Resp: cough [ N ];  wheezing[N  ];  hemoptysis[N  ] GI:  vomiting[ N ];  dysphagia[ N ]; melena[ N ];  hematochezia [ N ];  GU: hematuria[ N ];   dysuria [ N ];               Skin: rash, swelling[N  ];  Heme/Lymph:   bleeding[ N ];  anemia[ Y ];  Neuro:   stroke[Y  ];  vertigo[N  ];  seizures[ Y ];                Endocrine: diabetes[ Y ];  thyroid             dysfunction[ N ];     Physical Exam: BP (!) 161/88 (BP Location: Left Arm)   Pulse 94   Temp 98.5 F (36.9 C) (Oral)   Resp (!) 27   Ht 5' (1.524 m)   Wt 205 lb 0.4 oz (93 kg)   LMP 08/17/2017   SpO2 100%   BMI 40.04 kg/m    General appearance: alert, cooperative and no distress Head: Normocephalic, without obvious abnormality, atraumatic Resp: clear to auscultation bilaterally Cardio: RRR Extremities: Palpable DP bilaterally,feet warm Neurologic: Grossly normal motor; right sided UE/neck numbness  Diagnostic Studies & Laboratory data:     Recent  Radiology Findings:   CLINICAL DATA:  Acute chest pain for 2 days.  EXAM: CHEST  2 VIEW  COMPARISON:  02/28/2017 prior chest radiographs  FINDINGS: Mild cardiomegaly noted  Mild peribronchial thickening is unchanged.  There is no evidence of focal airspace disease, pulmonary edema, suspicious pulmonary nodule/mass, pleural effusion, or pneumothorax.  No acute bony abnormalities are identified.  IMPRESSION: Mild cardiomegaly without evidence of acute cardiopulmonary disease.   Electronically Signed   By: Margarette Canada M.D.   On: 08/27/2017 12:37  I have independently reviewed the above radiologic studies.  Recent Lab Findings: Lab Results  Component Value Date   WBC 9.0 08/29/2017   HGB 10.0 (L) 08/29/2017   HCT 31.6 (L) 08/29/2017   PLT 283 08/29/2017   GLUCOSE 212 (H) 08/29/2017   CHOL 170 03/07/2017   TRIG 116.0 03/07/2017   HDL 31.40 (L) 03/07/2017   LDLCALC 116 (H) 03/07/2017   ALT 19 08/27/2017   AST 23 08/27/2017   NA 135 08/29/2017   K 3.1 (L) 08/29/2017   CL 102 08/29/2017   CREATININE 1.01 (H) 08/29/2017   BUN 9 08/29/2017   CO2 23 08/29/2017   TSH 1.144 08/28/2017   INR 1.03 08/28/2017   HGBA1C 10.6 (H) 08/29/2017   Assessment / Plan:   1. S/p NSTEMI, coronary artery bypass grafting surgery-will need CABG, likely in am with Dr. Roxan Hockey. On Heparin drip which will be stopped on call to OR. 2. Hypertension-on Lopressor 25 mg bid, Losartan 100 mg daily, and Norvasc 10 mg daily 3. Tobacco abuse-encouraged cessation 4. Anemia-microcytic. According to patient, this is chronic. She does have SS trait. 5. DM-on Metformin and Glipizide. Pre op HGA1C 10.6. She will need close medical follow up after discharge.  6. Acute CHF-on Lasix 40 mg IV bid and Spironolactone 25 mg daily. She also has persistent hypokalemia on this admission. Hopefully, Spironolactone will help. 7. Regarding CVA, patient states she was told it was secondary to uncontrolled  high blood pressure. She is supposed to have follow up with a neurologist.  I  spent 20 minutes counseling the patient face to face.   Lars Pinks PA-C 08/29/2017 1:24 PM   Patient seen and examined, agree with above. 2 year history of chest tightness with exertion with recent progression. R/i for non-STEMI. At cath has severe 3 vessel CAD. EF 35-40% by echo. CABG indicated for survival benefit and relief of symptoms.  I discussed the general nature of the procedure, the need for general anesthesia, the incisions to be used and the use of cardiopulmonary bypass with Ms. Rizzolo. We discussed the expected hospital stay, overall recovery and short and long term outcomes. She understands CABG is palliative, not curative. I informed her of the indications, risks, benefits and alternatives.She understands the risks include, but are not limited to death, stroke, MI, DVT/PE, bleeding, possible need for transfusion, infections, cardiac arrhythmias, as well as other organ system dysfunction including respiratory, renal, or GI complications. She accepts the risks and agrees to proceed.  Plan CABG in AM 2/13  She also understands the importance of lifestyle modifications and medication compliance going forward, particularly with regards to weight loss, exercise, blood pressure control and diabetes control.  Tobacco abuse- Smoking cessation instruction/counseling given:  counseled patient on the dangers of tobacco use, advised patient to stop smoking, and reviewed strategies to maximize success  Remo Lipps C. Roxan Hockey, MD [facility] [phone]

## 2017-08-29 NOTE — Progress Notes (Signed)
ANTICOAGULATION CONSULT NOTE  Pharmacy Consult for Heparin Indication: chest pain/ACS  No Known Allergies  Patient Measurements: Height: 5' (152.4 cm) Weight: 205 lb 0.4 oz (93 kg) IBW/kg (Calculated) : 45.5 Heparin Dosing Weight: 68 kg  Vital Signs: Temp: 98.5 F (36.9 C) (02/12 0436) Temp Source: Oral (02/12 0436) BP: 161/88 (02/12 0917) Pulse Rate: 94 (02/12 0436)  Labs: Recent Labs    08/27/17 1155 08/27/17 1455 08/27/17 1828  08/28/17 0646 08/28/17 1142 08/29/17 0523 08/29/17 1251  HGB 10.2*  --   --   --  10.6*  --  10.0*  --   HCT 31.3*  --   --   --  33.1*  --  31.6*  --   PLT 273  --   --   --  282  --  283  --   LABPROT  --   --   --   --   --  13.4  --   --   INR  --   --   --   --   --  1.03  --   --   HEPARINUNFRC  --   --   --    < > 0.35  --  <0.10* 0.23*  CREATININE 1.02*  --  0.90  --  0.95  --  1.01*  --   TROPONINI 0.42* 1.27* 1.80*  --  1.83*  --   --   --    < > = values in this interval not displayed.    Estimated Creatinine Clearance: 70.9 mL/min (A) (by C-G formula based on SCr of 1.01 mg/dL (H)).   Medical History: Past Medical History:  Diagnosis Date  . Anemia   . CVA (cerebral infarction)    right side numbness  . Diabetes mellitus   . Hypertension   . Seizures ([facility])   . Stroke [facility](HCC)     Assessment: Michele [name] on IV heparin for ACS, s/p cath, with 3V CAD, pending CVTS consult for CABG.   Heparin level still low this afternoon at 0.23, no complications noted. Rate was ordered to be changed this morning but never changed in the pump, have communicated plan with nurse to increase rate, plan for CABG in am.   Goal of Therapy:  Heparin level 0.3-0.7 units/ml Monitor platelets by anticoagulation protocol: Yes   Plan:  Increase heparin to 1400 units/hr Daily HL, CBC  Sheppard CoilFrank Tephanie Escorcia PharmD., BCPS Clinical Pharmacist 08/29/2017 3:43 PM

## 2017-08-29 NOTE — Progress Notes (Signed)
Inpatient Diabetes Program Recommendations  AACE/ADA: New Consensus Statement on Inpatient Glycemic Control (2015)  Target Ranges:  Prepandial:   less than 140 mg/dL      Peak postprandial:   less than 180 mg/dL (1-2 hours)      Critically ill patients:  140 - 180 mg/dL   Lab Results  Component Value Date   GLUCAP 232 (H) 08/29/2017   HGBA1C 10.6 (H) 08/29/2017    Review of Glycemic ControlResults for Michele [name], Michele (MRN [id number]) as of 08/29/2017 10:46  Ref. Range 08/28/2017 11:17 08/28/2017 16:26 08/28/2017 20:59 08/29/2017 05:08 08/29/2017 06:22  Glucose-Capillary Latest Ref Range: 65 - 99 mg/dL 132191 (H) 440105 (H) 102313 (H) 217 (H) 232 (H)    Diabetes history: Type 2 DM Outpatient Diabetes medications: Metformin 1000 mg daily, Glucotrol 20 mg daily Current orders for Inpatient glycemic control:  Novolog moderate tid with meals  Inpatient Diabetes Program Recommendations:    Note elevated blood sugars.  Please add Lantus 18 units daily Spoke with patient at length regarding DM and A1C.  Patient states that she has not been taking care of herself and knows that this is important.  We discussed the importance of controlling blood sugars and DM's effects on the heart.  Patient seems to be motivated to make changes.  We briefly discussed insulin for a potential medication for d/c.  She is agreeable.  I discussed that maybe she could be on one shot per day plus oral agents.  Patient discussed her motivation to care for herself so that she can take care of her children.  She has Living well with DM booklet by bedside.  Will follow.   Thanks,  Beryl MeagerJenny Martese Vanatta, RN, BC-ADM Inpatient Diabetes Coordinator Pager [phone] (8a-5p)

## 2017-08-29 NOTE — Progress Notes (Signed)
ANTICOAGULATION CONSULT NOTE  Pharmacy Consult for Heparin Indication: chest pain/ACS  No Known Allergies  Patient Measurements: Height: 5' (152.4 cm) Weight: 205 lb 0.4 oz (93 kg) IBW/kg (Calculated) : 45.5 Heparin Dosing Weight: 68 kg  Vital Signs: Temp: 98.5 F (36.9 C) (02/12 0436) Temp Source: Oral (02/12 0436) BP: 174/76 (02/12 0658) Pulse Rate: 94 (02/12 0436)  Labs: Recent Labs    08/27/17 1155 08/27/17 1455 08/27/17 1828 08/27/17 2233 08/28/17 0646 08/28/17 1142 08/29/17 0523  HGB 10.2*  --   --   --  10.6*  --  10.0*  HCT 31.3*  --   --   --  33.1*  --  31.6*  PLT 273  --   --   --  282  --  283  LABPROT  --   --   --   --   --  13.4  --   INR  --   --   --   --   --  1.03  --   HEPARINUNFRC  --   --   --  0.20* 0.35  --  <0.10*  CREATININE 1.02*  --  0.90  --  0.95  --  1.01*  TROPONINI 0.42* 1.27* 1.80*  --  1.83*  --   --     Estimated Creatinine Clearance: 70.9 mL/min (A) (by C-G formula based on SCr of 1.01 mg/dL (H)).   Medical History: Past Medical History:  Diagnosis Date  . Anemia   . CVA (cerebral infarction)    right side numbness  . Diabetes mellitus   . Hypertension   . Seizures ([facility])   . Stroke [facility](HCC)     Assessment: 46 yom on IV heparin for ACS, s/p cath, with 3V CAD, pending CVTS consult for CABG. Heparin resumed s/p TR band removal. Initial heparin level is subtherapeutic.   Goal of Therapy:  Heparin level 0.3-0.7 units/ml Monitor platelets by anticoagulation protocol: Yes   Plan:  Increase heparin to 1350 units/hr Daily HL, CBC Check 6 hr level F/u plans for surgery   Baldemar FridayMasters, [place] M  08/29/2017 7:36 AM

## 2017-08-29 NOTE — Consult Note (Signed)
    [address]       [place] [address]             [phone]        Michele [name] [facility] Medical Record #[id number] Date of Birth: 11/23/1970  Referring: Dr. Crenshaw Primary Care: [name], [name] Primary Cardiologist: new to Dr. Crenshaw  Chief Complaint:    Chief Complaint  Patient presents with  . Chest Pain, shortness of breath   Reason for consultation: Coronary artery disease  History of Present Illness:     This is a 46 year with a history of hypertension, stroke, seizure, tobacco abuse who presented to [facility] on 08/27/2017 with a chief complaint of intermittent chest tightness and shortness of breath. She did have nausea on with at least one episode. Her symptoms are worse with exertion and improve with rest and symptoms appear to be worse at night. She denies pain radiation to jaw or arm, diaphoresis. She did state that she has not taken blood pressure medication in almost 3 weeks as she "ran out".  EKG showed sinus rhythm, minimal ST depression in lateral leads. Her initial Troponin I was 0.42 and went up to 1.83. She ruled in for a NSTEMI. Echo done yesterday showed LVEF 35-40%, diffuse hypokinesis, and trivial MR. She underwent cardiac catheterization yesterday. Results showed significant 3 vessel disease, LVEF  40%. Dr. Elias Bordner has been consulted regarding consideration of coronary artery bypass grafting surgery. Currently, vital signs are stable and she denies chest pain. She is on a Heparin drip.  Current Activity/ Functional Status: Patient is independent with mobility/ambulation, transfers, ADL's, IADL's.   Zubrod Score: At the time of surgery this patient's most appropriate activity status/level should be described as: []    0    Normal activity, no symptoms [x]    1    Restricted in physical strenuous activity but ambulatory, able to do out light work []    2    Ambulatory and capable of self care, unable to do work activities,  up and about more than 50%  Of the time                []    3    Only limited self care, in bed greater than 50% of waking hours []    4    Completely disabled, no self care, confined to bed or chair []    5    Moribund  Past Medical History:  Diagnosis Date  . Anemia   . CVA (cerebral infarction)    right side numbness  . Diabetes mellitus   . Hypertension   . Seizures (HCC)   . Stroke (HCC)     Past Surgical History:  Procedure Laterality Date  . btl    . CESAREAN SECTION    . KNEE ARTHROSCOPY    . LEFT HEART CATH AND CORONARY ANGIOGRAPHY N/A 08/28/2017   Procedure: LEFT HEART CATH AND CORONARY ANGIOGRAPHY;  Surgeon: End, Christopher, MD;  Location: [facility];  Service: Cardiovascular;  Laterality: N/A;  . TUBAL LIGATION        Social History   Socioeconomic History  . Marital status: Married    Spouse name: Derrick  . Number of children: Not on file  . Years of education: 16  . Highest education level: Not on file  Social Needs  . Financial resource strain: Not on file  . Food insecurity - worry: Not on   file  . Food insecurity - inability: Not on file  . Transportation needs - medical: Not on file  . Transportation needs - non-medical: Not on file  Occupational History  . Occupation: [employer]  Tobacco Use  . Smoking status: Current Every Day Smoker    Packs/day: 0.30    Years: 16.00    Pack years: 4.80  . Smokeless tobacco: Never Used  Substance and Sexual Activity  . Alcohol use: Yes  . Drug use: Marijuana  . Sexual activity: No  Social History Narrative   Lives with husband and kids   Right-handed   Caffeine use: large cup of coffee Mon through Fri   Allergies: No Known Allergies  Current Facility-Administered Medications  Medication Dose Route Frequency Provider Last Rate Last Dose  . 0.9 %  sodium chloride infusion  250 mL Intravenous PRN End, Christopher, MD      . acetaminophen (TYLENOL) tablet 650 mg  650 mg Oral Q4H  PRN Barnett, Adam S, MD      . amLODipine (NORVASC) tablet 10 mg  10 mg Oral Daily Barnett, Adam S, MD   10 mg at 08/29/17 0814  . aspirin EC tablet 81 mg  81 mg Oral Daily Barnett, Adam S, MD   81 mg at 08/29/17 0814  . atorvastatin (LIPITOR) tablet 80 mg  80 mg Oral q1800 Crenshaw, Brian S, MD   80 mg at 08/28/17 1742  . carvedilol (COREG) tablet 12.5 mg  12.5 mg Oral BID WC Barnett, Adam S, MD   12.5 mg at 08/29/17 0814  . furosemide (LASIX) injection 40 mg  40 mg Intravenous BID End, Christopher, MD   40 mg at 08/29/17 0815  . heparin ADULT infusion 100 units/mL (25000 units/250mL sodium chloride 0.45%)  1,350 Units/hr Intravenous Continuous Masters, Alison M, RPH 12 mL/hr at 08/28/17 2320 1,200 Units/hr at 08/28/17 2320  . hydrALAZINE (APRESOLINE) injection 5 mg  5 mg Intravenous Q6H PRN Barnett, Adam S, MD   5 mg at 08/28/17 0549  . hydrALAZINE (APRESOLINE) tablet 25 mg  25 mg Oral Q8H End, Christopher, MD   25 mg at 08/29/17 0511  . insulin aspart (novoLOG) injection 0-15 Units  0-15 Units Subcutaneous TID WC Barnett, Adam S, MD   3 Units at 08/29/17 1241  . isosorbide mononitrate (IMDUR) 24 hr tablet 30 mg  30 mg Oral Daily End, Christopher, MD   30 mg at 08/29/17 0814  . levETIRAcetam (KEPPRA) tablet 750 mg  750 mg Oral BID Barnett, Adam S, MD   750 mg at 08/29/17 0815  . losartan (COZAAR) tablet 100 mg  100 mg Oral Daily Barnett, Adam S, MD   100 mg at 08/29/17 0814  . nitroGLYCERIN (NITROSTAT) SL tablet 0.4 mg  0.4 mg Sublingual Q5 Min x 3 PRN Barnett, Adam S, MD      . pneumococcal 23 valent vaccine (PNU-IMMUNE) injection 0.5 mL  0.5 mL Intramuscular Prior to discharge Crenshaw, Brian S, MD      . sodium chloride flush (NS) 0.9 % injection 3 mL  3 mL Intravenous Q12H End, Christopher, MD   3 mL at 08/28/17 2104  . sodium chloride flush (NS) 0.9 % injection 3 mL  3 mL Intravenous PRN End, Christopher, MD      . spironolactone (ALDACTONE) tablet 25 mg  25 mg Oral Daily Crenshaw, Brian S,  MD   25 mg at 08/29/17 1001    Medications Prior to Admission  Medication Sig Dispense Refill   Last Dose  . amLODipine (NORVASC) 10 MG tablet Take 1 tablet (10 mg total) by mouth daily. 30 tablet 11 Past Month at Unknown time  . aspirin 325 MG tablet Take 1 tablet (325 mg total) by mouth daily.   08/26/2017  . calcium carbonate (TUMS EX) 750 MG chewable tablet Chew 2 tablets by mouth as needed for heartburn.   Past Week at Unknown time  . ferrous sulfate 325 (65 FE) MG tablet Take 1 tablet (325 mg total) by mouth daily with breakfast. 30 tablet 1 Past Week at Unknown time  . fluticasone (FLONASE) 50 MCG/ACT nasal spray Place 2 sprays into both nostrils daily. (Patient taking differently: Place 2 sprays into both nostrils as needed for allergies or rhinitis. ) 16 g 0 unk at prn  . glipiZIDE (GLUCOTROL XL) 10 MG 24 hr tablet Take 20 mg by mouth daily.   Past Week at Unknown time  . guaiFENesin 200 MG tablet Take 1 tablet (200 mg total) by mouth every 6 (six) hours as needed for cough or to loosen phlegm. 30 tablet 0 unk at prn  . ibuprofen (ADVIL,MOTRIN) 200 MG tablet Take 800 mg by mouth every 6 (six) hours as needed for cramping.   unk at prn  . levETIRAcetam (KEPPRA) 750 MG tablet Take 1 tablet (750 mg total) by mouth 2 (two) times daily. 60 tablet 11 08/27/2017 at Unknown time  . metFORMIN (GLUCOPHAGE) 1000 MG tablet Take 1,000 mg by mouth daily with breakfast.    Past Month at Unknown time  . metoprolol tartrate (LOPRESSOR) 25 MG tablet Take 1 tablet (25 mg total) by mouth 2 (two) times daily. 60 tablet 11 Past Month at Unknown time  . glucose monitoring kit (FREESTYLE) monitoring kit 1 each by Does not apply route as needed for other. 1 each 0 Taking  . lidocaine (XYLOCAINE) 2 % solution Use as directed 15 mLs in the mouth or throat as needed for mouth pain. (Patient not taking: Reported on 08/28/2017) 100 mL 0 Not Taking at Unknown time  . losartan (COZAAR) 100 MG tablet Take 1 tablet (100 mg  total) by mouth daily. (Patient not taking: Reported on 08/28/2017) 90 tablet 0 Not Taking at Unknown time  . omeprazole (PRILOSEC) 20 MG capsule Take 1 capsule (20 mg total) by mouth 2 (two) times daily before a meal. 28 capsule 0     Family History  Problem Relation Age of Onset  . Cancer Father   . Lupus Mother   . Diabetes Mother   . Rheum arthritis Mother   . Heart disease Mother   . Stroke Mother   . Kidney disease Maternal Grandmother   . Heart attack Maternal Grandfather   . Seizures Neg Hx     Review of Systems:               Cardiac Review of Systems: [Y]=             YES or  [ N   ]= NO  Chest Pain [ Y   ]  Resting SOB [ N  ] Exertional SOB  [Y  ]  Orthopnea [N  ]  Pedal Edema [ N  ]    Palpitations [N  ] Syncope  [ N]   Presyncope [ N  ]  General Review of Systems: [Y]             = YES or  [ N ]=no Constitional:  nausea [ Y ]; night   sweats Aqua.Slicker  ]; fever [ N ]; or chills [ N ]                                                     Eye : diplopia [  N ];  Resp: cough [ N ];  wheezing[N  ];  hemoptysis[N  ] GI:  vomiting[ N ];  dysphagia[ N ]; melena[ N ];  hematochezia [ N ];  GU: hematuria[ N ];   dysuria [ N ];               Skin: rash, swelling[N  ];  Heme/Lymph:   bleeding[ N ];  anemia[ Y ];  Neuro:   stroke[Y  ];  vertigo[N  ];  seizures[ Y ];                Endocrine: diabetes[ Y ];  thyroid             dysfunction[ N ];     Physical Exam: BP (!) 161/88 (BP Location: Left Arm)   Pulse 94   Temp 98.5 F (36.9 C) (Oral)   Resp (!) 27   Ht 5' (1.524 m)   Wt 205 lb 0.4 oz (93 kg)   LMP 08/17/2017   SpO2 100%   BMI 40.04 kg/m    General appearance: alert, cooperative and no distress Head: Normocephalic, without obvious abnormality, atraumatic Resp: clear to auscultation bilaterally Cardio: RRR Extremities: Palpable DP bilaterally,feet warm Neurologic: Grossly normal motor; right sided UE/neck numbness  Diagnostic Studies & Laboratory data:     Recent  Radiology Findings:   CLINICAL DATA:  Acute chest pain for 2 days.  EXAM: CHEST  2 VIEW  COMPARISON:  02/28/2017 prior chest radiographs  FINDINGS: Mild cardiomegaly noted  Mild peribronchial thickening is unchanged.  There is no evidence of focal airspace disease, pulmonary edema, suspicious pulmonary nodule/mass, pleural effusion, or pneumothorax.  No acute bony abnormalities are identified.  IMPRESSION: Mild cardiomegaly without evidence of acute cardiopulmonary disease.   Electronically Signed   By: Margarette Canada M.D.   On: 08/27/2017 12:37  I have independently reviewed the above radiologic studies.  Recent Lab Findings: Lab Results  Component Value Date   WBC 9.0 08/29/2017   HGB 10.0 (L) 08/29/2017   HCT 31.6 (L) 08/29/2017   PLT 283 08/29/2017   GLUCOSE 212 (H) 08/29/2017   CHOL 170 03/07/2017   TRIG 116.0 03/07/2017   HDL 31.40 (L) 03/07/2017   LDLCALC 116 (H) 03/07/2017   ALT 19 08/27/2017   AST 23 08/27/2017   NA 135 08/29/2017   K 3.1 (L) 08/29/2017   CL 102 08/29/2017   CREATININE 1.01 (H) 08/29/2017   BUN 9 08/29/2017   CO2 23 08/29/2017   TSH 1.144 08/28/2017   INR 1.03 08/28/2017   HGBA1C 10.6 (H) 08/29/2017   Assessment / Plan:   1. S/p NSTEMI, coronary artery bypass grafting surgery-will need CABG, likely in am with Dr. Roxan Hockey. On Heparin drip which will be stopped on call to OR. 2. Hypertension-on Lopressor 25 mg bid, Losartan 100 mg daily, and Norvasc 10 mg daily 3. Tobacco abuse-encouraged cessation 4. Anemia-microcytic. According to patient, this is chronic. She does have SS trait. 5. DM-on Metformin and Glipizide. Pre op HGA1C 10.6. She will need close medical follow up after discharge.  6. Acute CHF-on Lasix 40 mg IV bid and Spironolactone 25 mg daily. She also has persistent hypokalemia on this admission. Hopefully, Spironolactone will help. 7. Regarding CVA, patient states she was told it was secondary to uncontrolled  high blood pressure. She is supposed to have follow up with a neurologist.  I  spent 20 minutes counseling the patient face to face.   Lars Pinks PA-C 08/29/2017 1:24 PM   Patient seen and examined, agree with above. 2 year history of chest tightness with exertion with recent progression. R/i for non-STEMI. At cath has severe 3 vessel CAD. EF 35-40% by echo. CABG indicated for survival benefit and relief of symptoms.  I discussed the general nature of the procedure, the need for general anesthesia, the incisions to be used and the use of cardiopulmonary bypass with Michele [name]. We discussed the expected hospital stay, overall recovery and short and long term outcomes. She understands CABG is palliative, not curative. I informed her of the indications, risks, benefits and alternatives.She understands the risks include, but are not limited to death, stroke, MI, DVT/PE, bleeding, possible need for transfusion, infections, cardiac arrhythmias, as well as other organ system dysfunction including respiratory, renal, or GI complications. She accepts the risks and agrees to proceed.  Plan CABG in AM 2/13  She also understands the importance of lifestyle modifications and medication compliance going forward, particularly with regards to weight loss, exercise, blood pressure control and diabetes control.  Tobacco abuse- Smoking cessation instruction/counseling given:  counseled patient on the dangers of tobacco use, advised patient to stop smoking, and reviewed strategies to maximize success  Remo Lipps C. Roxan Hockey, MD [facility] [phone]

## 2017-08-29 NOTE — Progress Notes (Signed)
Pre-CABG testing has been completed. 1-39% ICA stenosis bilaterally.  ABI's Right 0.98 Left 0.79  08/29/17 2:12 PM Olen CordialGreg Constantine Ruddick RVT

## 2017-08-29 NOTE — Progress Notes (Signed)
Progress Note  Patient Name: Michele [name] Date of Encounter: 08/29/2017  Primary Cardiologist: New  Subjective   Denies CP or dyspnea  Inpatient Medications    Scheduled Meds: . amLODipine  10 mg Oral Daily  . aspirin EC  81 mg Oral Daily  . atorvastatin  80 mg Oral q1800  . carvedilol  12.5 mg Oral BID WC  . furosemide  40 mg Intravenous BID  . hydrALAZINE  25 mg Oral Q8H  . insulin aspart  0-15 Units Subcutaneous TID WC  . isosorbide mononitrate  30 mg Oral Daily  . levETIRAcetam  750 mg Oral BID  . losartan  100 mg Oral Daily  . sodium chloride flush  3 mL Intravenous Q12H   Continuous Infusions: . sodium chloride    . heparin 1,200 Units/hr (08/28/17 2320)   PRN Meds: sodium chloride, acetaminophen, hydrALAZINE, nitroGLYCERIN, pneumococcal 23 valent vaccine, sodium chloride flush   Vital Signs    Vitals:   08/28/17 2000 08/28/17 2316 08/29/17 0436 08/29/17 0658  BP: (!) 176/99 (!) 158/81 (!) 164/85 (!) 174/76  Pulse: (!) 103 93 94   Resp: (!) 26 (!) 27 (!) 21 (!) 29  Temp: 98 F (36.7 C) 98.3 F (36.8 C) 98.5 F (36.9 C)   TempSrc: Oral Oral Oral   SpO2: 99% 98% 99%   Weight:      Height:        Intake/Output Summary (Last 24 hours) at 08/29/2017 0835 Last data filed at 08/29/2017 0700 Gross per 24 hour  Intake 396 ml  Output 750 ml  Net -354 ml   Filed Weights   08/27/17 1140 08/27/17 1717  Weight: 206 lb (93.4 kg) 205 lb 0.4 oz (93 kg)    Telemetry    NSR- Personally Reviewed  Physical Exam   GEN: Obese NAD Neck: No JVD, supple Cardiac: RRR Respiratory: Clear to auscultation bilaterally; no wheeze GI: Soft, NT/ND MS: No edema; radial cath site with no hematoma Neuro:  grossly intact   Labs    Chemistry Recent Labs  Lab 08/27/17 1155 08/27/17 1828 08/28/17 0646 08/28/17 2023 08/29/17 0523  NA 134* 133* 135  --  135  K 2.5* 2.8* 3.3* 3.5 3.1*  CL 98* 99* 101  --  102  CO2 28 25 23   --  23  GLUCOSE 289* 269* 302*  --   212*  BUN 9 7 8   --  9  CREATININE 1.02* 0.90 0.95  --  1.01*  CALCIUM 8.9 8.8* 8.9  --  8.6*  PROT 7.6  --   --   --   --   ALBUMIN 3.2*  --   --   --   --   AST 23  --   --   --   --   ALT 19  --   --   --   --   ALKPHOS 62  --   --   --   --   BILITOT 0.6  --   --   --   --   GFRNONAA >60 >60 >60  --  >60  GFRAA >60 >60 >60  --  >60  ANIONGAP 8 9 11   --  10     Hematology Recent Labs  Lab 08/27/17 1155 08/28/17 0646 08/29/17 0523  WBC 8.6 10.0 9.0  RBC 4.19 4.39 4.20  HGB 10.2* 10.6* 10.0*  HCT 31.3* 33.1* 31.6*  MCV 74.7* 75.4* 75.2*  MCH 24.3* 24.1* 23.8*  MCHC  32.6 32.0 31.6  RDW 15.8* 16.1* 16.3*  PLT 273 282 283    Cardiac Enzymes Recent Labs  Lab 08/27/17 1155 08/27/17 1455 08/27/17 1828 08/28/17 0646  TROPONINI 0.42* 1.27* 1.80* 1.83*    Radiology    Dg Chest 2 View  Result Date: 08/27/2017 CLINICAL DATA:  Acute chest pain for 2 days. EXAM: CHEST  2 VIEW COMPARISON:  02/28/2017 prior chest radiographs FINDINGS: Mild cardiomegaly noted Mild peribronchial thickening is unchanged. There is no evidence of focal airspace disease, pulmonary edema, suspicious pulmonary nodule/mass, pleural effusion, or pneumothorax. No acute bony abnormalities are identified. IMPRESSION: Mild cardiomegaly without evidence of acute cardiopulmonary disease. Electronically Signed   By: Harmon PierJeffrey  Hu M.D.   On: 08/27/2017 12:37     Patient Profile     47 y.o. female admitted with hypertensive urgency (not taking meds) and NSTEMI.  Assessment & Plan    1 non-ST elevation myocardial infarction- Continue aspirin, lipitor, heparin, and carvedilol (increase to 25 mg BID).  Cath reveals 3 vessel CAD; moderate LV dysfunction; CVTS consult for CABG.  2 hypertension-blood pressure remains elevated; increase coreg to 25 mg BID and follow.  3 hypokalemia-supplement.  Add spironolactone and follow.    4 Tobacco abuse-patient previously counseled on discontinuing.  5  noncompliance-patient counseled on importance of medication compliance.  6 microcytic anemia-patient states this is long-standing.  She still menstruates and also has sickle cell trait.  7 diabetes mellitus-diabetes consult  8 acute combined systolic and diastolic CHF-continue lasix 40 BID and add spironolactone 25 mg daily; check bmet in AM.  9 cardiomyopathy-likely related to both ischemia and uncontrolled hypertension; continue coreg, cozaar and hydralazine/nitrates.  For questions or updates, please contact [facility] Please consult [email or web address] for contact info under Cardiology/STEMI.      Signed, Olga MillersBrian Rustin Erhart, MD  08/29/2017, 8:35 AM

## 2017-08-29 NOTE — Care Management Note (Signed)
Case Management Note Donn PieriniKristi Marcee Jacobs RN, BSN Unit 4E-Case Manager [id number]  Patient Details  Name: Michele [name] MRN: [id number] Date of Birth: 1971-01-16  Subjective/Objective:  Pt admitted with [place], s/p cath with 3VD,  Awaiting CVTS consult                  Action/Plan: PTA Pt lived at home, independent has insurance and medication coverage- received notice pt had questions about advanced directives and medications- spoke with pt at bedside- packet provided about advanced directives- pt to review and let bedside RN know when she is ready for Chaplain to come f/u with her for Advance Directives. Also discussed medications pt states that she uses [facility]- most are $4 but she reports that she has few that are higher copays that she sometimes has difficulty with - especially her DM supplies. Discussed other options for DM supplies and to f/u with her pharmacist. CM will follow for further transition needs.   Expected Discharge Date:                  Expected Discharge Plan:     In-House Referral:  Chaplain  Discharge planning Services  CM Consult  Post Acute Care Choice:    Choice offered to:     DME Arranged:    DME Agency:     HH Arranged:    HH Agency:     Status of Service:  In process, will continue to follow  If discussed at Long Length of Stay Meetings, dates discussed:    Discharge Disposition:   Additional Comments:  Darrold SpanWebster, Medea Deines Hall, RN 08/29/2017, 12:12 PM

## 2017-08-30 ENCOUNTER — Inpatient Hospital Stay (HOSPITAL_COMMUNITY): Payer: 59 | Admitting: Certified Registered Nurse Anesthetist

## 2017-08-30 ENCOUNTER — Inpatient Hospital Stay (HOSPITAL_COMMUNITY)
Admission: EM | Disposition: A | Discharge status: Home Health | Payer: Self-pay | Source: Home / Self Care | Attending: Thoracic Surgery (Cardiothoracic Vascular Surgery)

## 2017-08-30 ENCOUNTER — Inpatient Hospital Stay (HOSPITAL_COMMUNITY): Payer: 59

## 2017-08-30 DIAGNOSIS — I2510 Atherosclerotic heart disease of native coronary artery without angina pectoris: Secondary | ICD-10-CM | POA: Diagnosis present

## 2017-08-30 DIAGNOSIS — I25110 Atherosclerotic heart disease of native coronary artery with unstable angina pectoris: Secondary | ICD-10-CM

## 2017-08-30 HISTORY — PX: TEE WITHOUT CARDIOVERSION: SHX5443

## 2017-08-30 HISTORY — PX: CORONARY ARTERY BYPASS GRAFT: SHX141

## 2017-08-30 LAB — CBC
HCT: 32.5 % — ABNORMAL LOW (ref 36.0–46.0)
HEMATOCRIT: 26.3 % — AB (ref 36.0–46.0)
HEMATOCRIT: 27.4 % — AB (ref 36.0–46.0)
HEMOGLOBIN: 8.2 g/dL — AB (ref 12.0–15.0)
Hemoglobin: 10.2 g/dL — ABNORMAL LOW (ref 12.0–15.0)
Hemoglobin: 8.5 g/dL — ABNORMAL LOW (ref 12.0–15.0)
MCH: 23.8 pg — AB (ref 26.0–34.0)
MCH: 23.8 pg — ABNORMAL LOW (ref 26.0–34.0)
MCH: 23.6 pg — AB (ref 26.0–34.0)
MCHC: 31.4 g/dL (ref 30.0–36.0)
MCHC: 31.2 g/dL (ref 30.0–36.0)
MCHC: 31 g/dL (ref 30.0–36.0)
MCV: 75.8 fL — AB (ref 78.0–100.0)
MCV: 76.2 fL — AB (ref 78.0–100.0)
MCV: 76.1 fL — AB (ref 78.0–100.0)
PLATELETS: 272 10*3/uL (ref 150–400)
PLATELETS: 208 10*3/uL (ref 150–400)
PLATELETS: 220 10*3/uL (ref 150–400)
RBC: 4.29 MIL/uL (ref 3.87–5.11)
RBC: 3.45 MIL/uL — AB (ref 3.87–5.11)
RBC: 3.6 MIL/uL — ABNORMAL LOW (ref 3.87–5.11)
RDW: 16.3 % — AB (ref 11.5–15.5)
RDW: 16.4 % — ABNORMAL HIGH (ref 11.5–15.5)
RDW: 16.1 % — AB (ref 11.5–15.5)
WBC: 8.2 10*3/uL (ref 4.0–10.5)
WBC: 16.3 10*3/uL — ABNORMAL HIGH (ref 4.0–10.5)
WBC: 14.3 10*3/uL — ABNORMAL HIGH (ref 4.0–10.5)

## 2017-08-30 LAB — POCT I-STAT 3, ART BLOOD GAS (G3+)
ACID-BASE DEFICIT: 5 mmol/L — AB (ref 0.0–2.0)
Acid-Base Excess: 2 mmol/L (ref 0.0–2.0)
Acid-base deficit: 3 mmol/L — ABNORMAL HIGH (ref 0.0–2.0)
Acid-base deficit: 3 mmol/L — ABNORMAL HIGH (ref 0.0–2.0)
Acid-base deficit: 4 mmol/L — ABNORMAL HIGH (ref 0.0–2.0)
BICARBONATE: 26.2 mmol/L (ref 20.0–28.0)
Bicarbonate: 21.5 mmol/L (ref 20.0–28.0)
Bicarbonate: 22.6 mmol/L (ref 20.0–28.0)
Bicarbonate: 20.3 mmol/L (ref 20.0–28.0)
Bicarbonate: 20.8 mmol/L (ref 20.0–28.0)
O2 SAT: 95 %
O2 Saturation: 100 %
O2 Saturation: 96 %
O2 Saturation: 92 %
O2 Saturation: 94 %
PCO2 ART: 38.6 mmHg (ref 32.0–48.0)
PCO2 ART: 33.8 mmHg (ref 32.0–48.0)
PCO2 ART: 41.2 mmHg (ref 32.0–48.0)
PCO2 ART: 35.7 mmHg (ref 32.0–48.0)
PH ART: 7.441 (ref 7.350–7.450)
PH ART: 7.412 (ref 7.350–7.450)
PH ART: 7.342 — AB (ref 7.350–7.450)
PH ART: 7.376 (ref 7.350–7.450)
PO2 ART: 450 mmHg — AB (ref 83.0–108.0)
PO2 ART: 77 mmHg — AB (ref 83.0–108.0)
Patient temperature: 37.2
TCO2: 27 mmol/L (ref 22–32)
TCO2: 23 mmol/L (ref 22–32)
TCO2: 24 mmol/L (ref 22–32)
TCO2: 21 mmol/L — ABNORMAL LOW (ref 22–32)
TCO2: 22 mmol/L (ref 22–32)
pCO2 arterial: 36 mmHg (ref 32.0–48.0)
pH, Arterial: 7.359 (ref 7.350–7.450)
pO2, Arterial: 64 mmHg — ABNORMAL LOW (ref 83.0–108.0)
pO2, Arterial: 80 mmHg — ABNORMAL LOW (ref 83.0–108.0)
pO2, Arterial: 71 mmHg — ABNORMAL LOW (ref 83.0–108.0)

## 2017-08-30 LAB — POCT I-STAT, CHEM 8
BUN: 9 mg/dL (ref 6–20)
BUN: 8 mg/dL (ref 6–20)
BUN: 9 mg/dL (ref 6–20)
BUN: 9 mg/dL (ref 6–20)
BUN: 9 mg/dL (ref 6–20)
BUN: 9 mg/dL (ref 6–20)
BUN: 10 mg/dL (ref 6–20)
BUN: 12 mg/dL (ref 6–20)
CALCIUM ION: 1.08 mmol/L — AB (ref 1.15–1.40)
CALCIUM ION: 1.1 mmol/L — AB (ref 1.15–1.40)
CALCIUM ION: 1.1 mmol/L — AB (ref 1.15–1.40)
CALCIUM ION: 1.12 mmol/L — AB (ref 1.15–1.40)
CHLORIDE: 99 mmol/L — AB (ref 101–111)
CHLORIDE: 100 mmol/L — AB (ref 101–111)
CHLORIDE: 102 mmol/L (ref 101–111)
CREATININE: 0.8 mg/dL (ref 0.44–1.00)
CREATININE: 1 mg/dL (ref 0.44–1.00)
CREATININE: 1.1 mg/dL — AB (ref 0.44–1.00)
Calcium, Ion: 1.2 mmol/L (ref 1.15–1.40)
Calcium, Ion: 0.96 mmol/L — ABNORMAL LOW (ref 1.15–1.40)
Calcium, Ion: 1.21 mmol/L (ref 1.15–1.40)
Calcium, Ion: 1.08 mmol/L — ABNORMAL LOW (ref 1.15–1.40)
Chloride: 101 mmol/L (ref 101–111)
Chloride: 101 mmol/L (ref 101–111)
Chloride: 101 mmol/L (ref 101–111)
Chloride: 103 mmol/L (ref 101–111)
Chloride: 105 mmol/L (ref 101–111)
Creatinine, Ser: 0.9 mg/dL (ref 0.44–1.00)
Creatinine, Ser: 0.9 mg/dL (ref 0.44–1.00)
Creatinine, Ser: 1 mg/dL (ref 0.44–1.00)
Creatinine, Ser: 1 mg/dL (ref 0.44–1.00)
Creatinine, Ser: 1.1 mg/dL — ABNORMAL HIGH (ref 0.44–1.00)
Glucose, Bld: 161 mg/dL — ABNORMAL HIGH (ref 65–99)
Glucose, Bld: 110 mg/dL — ABNORMAL HIGH (ref 65–99)
Glucose, Bld: 87 mg/dL (ref 65–99)
Glucose, Bld: 111 mg/dL — ABNORMAL HIGH (ref 65–99)
Glucose, Bld: 140 mg/dL — ABNORMAL HIGH (ref 65–99)
Glucose, Bld: 170 mg/dL — ABNORMAL HIGH (ref 65–99)
Glucose, Bld: 175 mg/dL — ABNORMAL HIGH (ref 65–99)
Glucose, Bld: 102 mg/dL — ABNORMAL HIGH (ref 65–99)
HCT: 23 % — ABNORMAL LOW (ref 36.0–46.0)
HCT: 26 % — ABNORMAL LOW (ref 36.0–46.0)
HCT: 27 % — ABNORMAL LOW (ref 36.0–46.0)
HEMATOCRIT: 30 % — AB (ref 36.0–46.0)
HEMATOCRIT: 25 % — AB (ref 36.0–46.0)
HEMATOCRIT: 27 % — AB (ref 36.0–46.0)
HEMATOCRIT: 25 % — AB (ref 36.0–46.0)
HEMATOCRIT: 28 % — AB (ref 36.0–46.0)
HEMOGLOBIN: 10.2 g/dL — AB (ref 12.0–15.0)
HEMOGLOBIN: 9.2 g/dL — AB (ref 12.0–15.0)
HEMOGLOBIN: 7.8 g/dL — AB (ref 12.0–15.0)
HEMOGLOBIN: 8.8 g/dL — AB (ref 12.0–15.0)
HEMOGLOBIN: 9.2 g/dL — AB (ref 12.0–15.0)
Hemoglobin: 8.5 g/dL — ABNORMAL LOW (ref 12.0–15.0)
Hemoglobin: 8.5 g/dL — ABNORMAL LOW (ref 12.0–15.0)
Hemoglobin: 9.5 g/dL — ABNORMAL LOW (ref 12.0–15.0)
POTASSIUM: 3.1 mmol/L — AB (ref 3.5–5.1)
POTASSIUM: 3.8 mmol/L (ref 3.5–5.1)
POTASSIUM: 3.7 mmol/L (ref 3.5–5.1)
Potassium: 3.3 mmol/L — ABNORMAL LOW (ref 3.5–5.1)
Potassium: 3.6 mmol/L (ref 3.5–5.1)
Potassium: 4.6 mmol/L (ref 3.5–5.1)
Potassium: 3.8 mmol/L (ref 3.5–5.1)
Potassium: 4.2 mmol/L (ref 3.5–5.1)
SODIUM: 139 mmol/L (ref 135–145)
SODIUM: 140 mmol/L (ref 135–145)
SODIUM: 141 mmol/L (ref 135–145)
SODIUM: 137 mmol/L (ref 135–145)
SODIUM: 138 mmol/L (ref 135–145)
SODIUM: 140 mmol/L (ref 135–145)
SODIUM: 140 mmol/L (ref 135–145)
SODIUM: 138 mmol/L (ref 135–145)
TCO2: 24 mmol/L (ref 22–32)
TCO2: 26 mmol/L (ref 22–32)
TCO2: 26 mmol/L (ref 22–32)
TCO2: 26 mmol/L (ref 22–32)
TCO2: 22 mmol/L (ref 22–32)
TCO2: 26 mmol/L (ref 22–32)
TCO2: 22 mmol/L (ref 22–32)
TCO2: 22 mmol/L (ref 22–32)

## 2017-08-30 LAB — GLUCOSE, CAPILLARY
GLUCOSE-CAPILLARY: 149 mg/dL — AB (ref 65–99)
GLUCOSE-CAPILLARY: 137 mg/dL — AB (ref 65–99)
GLUCOSE-CAPILLARY: 108 mg/dL — AB (ref 65–99)
GLUCOSE-CAPILLARY: 118 mg/dL — AB (ref 65–99)
GLUCOSE-CAPILLARY: 115 mg/dL — AB (ref 65–99)
Glucose-Capillary: 209 mg/dL — ABNORMAL HIGH (ref 65–99)
Glucose-Capillary: 127 mg/dL — ABNORMAL HIGH (ref 65–99)
Glucose-Capillary: 113 mg/dL — ABNORMAL HIGH (ref 65–99)

## 2017-08-30 LAB — BASIC METABOLIC PANEL
ANION GAP: 11 (ref 5–15)
BUN: 11 mg/dL (ref 6–20)
CALCIUM: 8.6 mg/dL — AB (ref 8.9–10.3)
CO2: 23 mmol/L (ref 22–32)
CREATININE: 1.07 mg/dL — AB (ref 0.44–1.00)
Chloride: 102 mmol/L (ref 101–111)
GLUCOSE: 214 mg/dL — AB (ref 65–99)
Potassium: 3.2 mmol/L — ABNORMAL LOW (ref 3.5–5.1)
Sodium: 136 mmol/L (ref 135–145)

## 2017-08-30 LAB — CREATININE, SERUM
Creatinine, Ser: 1.2 mg/dL — ABNORMAL HIGH (ref 0.44–1.00)
GFR calc non Af Amer: 53 mL/min — ABNORMAL LOW (ref >60)

## 2017-08-30 LAB — HEMOGLOBIN AND HEMATOCRIT, BLOOD
HEMATOCRIT: 22.2 % — AB (ref 36.0–46.0)
Hemoglobin: 7.3 g/dL — ABNORMAL LOW (ref 12.0–15.0)

## 2017-08-30 LAB — PREPARE RBC (CROSSMATCH)

## 2017-08-30 LAB — APTT
APTT: 41 s — AB (ref 24–36)
APTT: 26 s (ref 24–36)

## 2017-08-30 LAB — SURGICAL PCR SCREEN
MRSA, PCR: NEGATIVE
Staphylococcus aureus: NEGATIVE

## 2017-08-30 LAB — PLATELET COUNT: PLATELETS: 191 10*3/uL (ref 150–400)

## 2017-08-30 LAB — POCT PREGNANCY, URINE: Preg Test, Ur: NEGATIVE

## 2017-08-30 LAB — MAGNESIUM: Magnesium: 3.3 mg/dL — ABNORMAL HIGH (ref 1.7–2.4)

## 2017-08-30 LAB — HEPARIN LEVEL (UNFRACTIONATED): HEPARIN UNFRACTIONATED: 0.13 [IU]/mL — AB (ref 0.30–0.70)

## 2017-08-30 LAB — PROTIME-INR
INR: 1.32
Prothrombin Time: 16.3 seconds — ABNORMAL HIGH (ref 11.4–15.2)

## 2017-08-30 SURGERY — CORONARY ARTERY BYPASS GRAFTING (CABG)
Anesthesia: General | Site: Chest

## 2017-08-30 MED ORDER — ACETAMINOPHEN 500 MG PO TABS
1000.0000 mg | ORAL_TABLET | Freq: Four times a day (QID) | ORAL | Status: AC
  Administered 2017-08-31 – 2017-09-04 (×15): 1000 mg via ORAL
  Filled 2017-08-30 (×15): qty 2

## 2017-08-30 MED ORDER — CHLORHEXIDINE GLUCONATE 0.12% ORAL RINSE (MEDLINE KIT)
15.0000 mL | Freq: Two times a day (BID) | OROMUCOSAL | Status: DC
  Administered 2017-08-30 – 2017-08-31 (×2): 15 mL via OROMUCOSAL

## 2017-08-30 MED ORDER — SUCCINYLCHOLINE CHLORIDE 20 MG/ML IJ SOLN
INTRAMUSCULAR | Status: DC | PRN
  Administered 2017-08-30: 120 mg via INTRAVENOUS

## 2017-08-30 MED ORDER — BISACODYL 10 MG RE SUPP: 10.0000 mg | Freq: Every day | RECTAL | Status: DC

## 2017-08-30 MED ORDER — TRAMADOL HCL 50 MG PO TABS
50.0000 mg | ORAL_TABLET | ORAL | Status: DC | PRN
  Administered 2017-08-31: 100 mg via ORAL
  Administered 2017-09-02 – 2017-09-03 (×2): 50 mg via ORAL
  Administered 2017-09-04: 100 mg via ORAL
  Filled 2017-08-30 (×2): qty 2
  Filled 2017-08-30 (×2): qty 1

## 2017-08-30 MED ORDER — SODIUM CHLORIDE 0.9 % IV SOLN: 250.0000 mL | INTRAVENOUS | Status: DC

## 2017-08-30 MED ORDER — LEVETIRACETAM 100 MG/ML PO SOLN
750.0000 mg | Freq: Two times a day (BID) | ORAL | Status: DC
  Administered 2017-08-30 – 2017-09-03 (×9): 750 mg
  Filled 2017-08-30 (×10): qty 7.5

## 2017-08-30 MED ORDER — ASPIRIN 81 MG PO CHEW
324.0000 mg | CHEWABLE_TABLET | Freq: Every day | ORAL | Status: DC
  Administered 2017-09-05: 324 mg
  Filled 2017-08-30: qty 4

## 2017-08-30 MED ORDER — PROPOFOL 10 MG/ML IV BOLUS
INTRAVENOUS | Status: AC
Start: 2017-08-30 — End: ?
  Filled 2017-08-30: qty 20

## 2017-08-30 MED ORDER — CEFUROXIME SODIUM 1.5 G IV SOLR
1.5000 g | Freq: Two times a day (BID) | INTRAVENOUS | Status: AC
  Administered 2017-08-30 – 2017-09-01 (×4): 1.5 g via INTRAVENOUS
  Filled 2017-08-30 (×4): qty 1.5

## 2017-08-30 MED ORDER — NITROGLYCERIN IN D5W 200-5 MCG/ML-% IV SOLN
0.0000 ug/min | INTRAVENOUS | Status: DC
  Administered 2017-08-31 – 2017-09-01 (×3): 100 ug/min via INTRAVENOUS
  Filled 2017-08-30 (×3): qty 250

## 2017-08-30 MED ORDER — POTASSIUM CHLORIDE 10 MEQ/50ML IV SOLN
10.0000 meq | INTRAVENOUS | Status: AC
  Administered 2017-08-30 (×3): 10 meq via INTRAVENOUS

## 2017-08-30 MED ORDER — DEXMEDETOMIDINE HCL IN NACL 200 MCG/50ML IV SOLN
INTRAVENOUS | Status: DC | PRN
  Administered 2017-08-30: .3 ug/kg/h via INTRAVENOUS

## 2017-08-30 MED ORDER — MORPHINE SULFATE (PF) 4 MG/ML IV SOLN
1.0000 mg | INTRAVENOUS | Status: AC | PRN
  Administered 2017-08-30: 4 mg via INTRAVENOUS
  Filled 2017-08-30: qty 1

## 2017-08-30 MED ORDER — DEXMEDETOMIDINE HCL IN NACL 200 MCG/50ML IV SOLN
0.0000 ug/kg/h | INTRAVENOUS | Status: DC
  Administered 2017-08-30: 0.5 ug/kg/h via INTRAVENOUS
  Administered 2017-08-31: 0.1 ug/kg/h via INTRAVENOUS
  Filled 2017-08-30 (×2): qty 50

## 2017-08-30 MED ORDER — DOCUSATE SODIUM 100 MG PO CAPS
200.0000 mg | ORAL_CAPSULE | Freq: Every day | ORAL | Status: DC
  Administered 2017-09-01 – 2017-09-02 (×2): 200 mg via ORAL
  Filled 2017-08-30 (×4): qty 2

## 2017-08-30 MED ORDER — LACTATED RINGERS IV SOLN
INTRAVENOUS | Status: DC | PRN
  Administered 2017-08-30: 08:00:00 via INTRAVENOUS

## 2017-08-30 MED ORDER — ALBUMIN HUMAN 5 % IV SOLN
INTRAVENOUS | Status: DC | PRN
  Administered 2017-08-30: 14:00:00 via INTRAVENOUS

## 2017-08-30 MED ORDER — LACTATED RINGERS IV SOLN
500.0000 mL | Freq: Once | INTRAVENOUS | Status: AC | PRN
  Administered 2017-08-30: 500 mL via INTRAVENOUS

## 2017-08-30 MED ORDER — DEXMEDETOMIDINE HCL IN NACL 200 MCG/50ML IV SOLN
INTRAVENOUS | Status: AC
  Filled 2017-08-30: qty 50

## 2017-08-30 MED ORDER — ACETAMINOPHEN 650 MG RE SUPP
650.0000 mg | Freq: Once | RECTAL | Status: AC
  Administered 2017-08-30: 650 mg via RECTAL

## 2017-08-30 MED ORDER — INSULIN REGULAR HUMAN 100 UNIT/ML IJ SOLN
INTRAMUSCULAR | Status: DC | PRN
  Administered 2017-08-30: 2 [IU]/h via INTRAVENOUS

## 2017-08-30 MED ORDER — ROCURONIUM BROMIDE 10 MG/ML (PF) SYRINGE
PREFILLED_SYRINGE | INTRAVENOUS | Status: AC
  Filled 2017-08-30: qty 5

## 2017-08-30 MED ORDER — METOPROLOL TARTRATE 5 MG/5ML IV SOLN
2.5000 mg | INTRAVENOUS | Status: DC | PRN
  Administered 2017-08-30 – 2017-08-31 (×4): 2.5 mg via INTRAVENOUS
  Administered 2017-08-31 – 2017-09-03 (×6): 5 mg via INTRAVENOUS
  Filled 2017-08-30 (×8): qty 5

## 2017-08-30 MED ORDER — HEPARIN SODIUM (PORCINE) 1000 UNIT/ML IJ SOLN
INTRAMUSCULAR | Status: AC
  Filled 2017-08-30: qty 1

## 2017-08-30 MED ORDER — VANCOMYCIN HCL IN DEXTROSE 1-5 GM/200ML-% IV SOLN
1000.0000 mg | Freq: Once | INTRAVENOUS | Status: AC
  Administered 2017-08-30: 1000 mg via INTRAVENOUS
  Filled 2017-08-30: qty 200

## 2017-08-30 MED ORDER — SODIUM CHLORIDE 0.9% FLUSH: 3.0000 mL | INTRAVENOUS | Status: DC | PRN

## 2017-08-30 MED ORDER — ONDANSETRON HCL 4 MG/2ML IJ SOLN: 4.0000 mg | Freq: Four times a day (QID) | INTRAMUSCULAR | Status: DC | PRN

## 2017-08-30 MED ORDER — MORPHINE SULFATE (PF) 4 MG/ML IV SOLN
2.0000 mg | INTRAVENOUS | Status: DC | PRN
  Administered 2017-08-31: 4 mg via INTRAVENOUS
  Administered 2017-09-01: 2 mg via INTRAVENOUS
  Filled 2017-08-30 (×2): qty 1

## 2017-08-30 MED ORDER — DOPAMINE-DEXTROSE 3.2-5 MG/ML-% IV SOLN
INTRAVENOUS | Status: DC | PRN
  Administered 2017-08-30: 3 ug/kg/min via INTRAVENOUS

## 2017-08-30 MED ORDER — SUCCINYLCHOLINE CHLORIDE 200 MG/10ML IV SOSY
PREFILLED_SYRINGE | INTRAVENOUS | Status: AC
  Filled 2017-08-30: qty 10

## 2017-08-30 MED ORDER — SODIUM CHLORIDE 0.9 % IV SOLN
INTRAVENOUS | Status: DC
  Filled 2017-08-30: qty 1

## 2017-08-30 MED ORDER — MIDAZOLAM HCL 2 MG/2ML IJ SOLN
2.0000 mg | INTRAMUSCULAR | Status: DC | PRN
  Administered 2017-08-30 – 2017-08-31 (×2): 2 mg via INTRAVENOUS
  Filled 2017-08-30 (×2): qty 2

## 2017-08-30 MED ORDER — FAMOTIDINE IN NACL 20-0.9 MG/50ML-% IV SOLN
20.0000 mg | Freq: Two times a day (BID) | INTRAVENOUS | Status: AC
  Administered 2017-08-30 (×2): 20 mg via INTRAVENOUS
  Filled 2017-08-30: qty 50

## 2017-08-30 MED ORDER — PROTAMINE SULFATE 10 MG/ML IV SOLN
INTRAVENOUS | Status: AC
  Filled 2017-08-30: qty 25

## 2017-08-30 MED ORDER — METOPROLOL TARTRATE 25 MG/10 ML ORAL SUSPENSION
12.5000 mg | Freq: Two times a day (BID) | ORAL | Status: DC
  Administered 2017-08-30: 12.5 mg
  Filled 2017-08-30: qty 5

## 2017-08-30 MED ORDER — MAGNESIUM SULFATE 4 GM/100ML IV SOLN
4.0000 g | Freq: Once | INTRAVENOUS | Status: AC
  Administered 2017-08-30: 4 g via INTRAVENOUS
  Filled 2017-08-30: qty 100

## 2017-08-30 MED ORDER — METOPROLOL TARTRATE 12.5 MG HALF TABLET
12.5000 mg | ORAL_TABLET | Freq: Two times a day (BID) | ORAL | Status: DC
  Administered 2017-08-31: 12.5 mg via ORAL
  Filled 2017-08-30: qty 1

## 2017-08-30 MED ORDER — HEMOSTATIC AGENTS (NO CHARGE) OPTIME
TOPICAL | Status: DC | PRN
  Administered 2017-08-30: 1 via TOPICAL
  Administered 2017-08-30: 3 via TOPICAL
  Administered 2017-08-30: 1 via TOPICAL

## 2017-08-30 MED ORDER — PROTAMINE SULFATE 10 MG/ML IV SOLN
INTRAVENOUS | Status: DC | PRN
  Administered 2017-08-30: 310 mg via INTRAVENOUS
  Administered 2017-08-30: 20 mg via INTRAVENOUS

## 2017-08-30 MED ORDER — FENTANYL CITRATE (PF) 250 MCG/5ML IJ SOLN
INTRAMUSCULAR | Status: AC
  Filled 2017-08-30: qty 5

## 2017-08-30 MED ORDER — MIDAZOLAM HCL 10 MG/2ML IJ SOLN
INTRAMUSCULAR | Status: AC
  Filled 2017-08-30: qty 2

## 2017-08-30 MED ORDER — CEFUROXIME SODIUM 750 MG IJ SOLR
INTRAMUSCULAR | Status: DC | PRN
  Administered 2017-08-30: 750 mg via INTRAVENOUS

## 2017-08-30 MED ORDER — FENTANYL CITRATE (PF) 100 MCG/2ML IJ SOLN
INTRAMUSCULAR | Status: DC | PRN
  Administered 2017-08-30: 50 ug via INTRAVENOUS
  Administered 2017-08-30 (×2): 100 ug via INTRAVENOUS
  Administered 2017-08-30: 50 ug via INTRAVENOUS
  Administered 2017-08-30: 100 ug via INTRAVENOUS
  Administered 2017-08-30: 50 ug via INTRAVENOUS
  Administered 2017-08-30 (×2): 100 ug via INTRAVENOUS
  Administered 2017-08-30 (×2): 200 ug via INTRAVENOUS
  Administered 2017-08-30: 50 ug via INTRAVENOUS
  Administered 2017-08-30: 100 ug via INTRAVENOUS
  Administered 2017-08-30: 200 ug via INTRAVENOUS
  Administered 2017-08-30: 100 ug via INTRAVENOUS

## 2017-08-30 MED ORDER — CHLORHEXIDINE GLUCONATE 0.12 % MT SOLN
15.0000 mL | OROMUCOSAL | Status: AC
  Administered 2017-08-30: 15 mL via OROMUCOSAL
  Filled 2017-08-30: qty 15

## 2017-08-30 MED ORDER — MIDAZOLAM HCL 5 MG/5ML IJ SOLN
INTRAMUSCULAR | Status: DC | PRN
  Administered 2017-08-30: 2 mg via INTRAVENOUS
  Administered 2017-08-30: 5 mg via INTRAVENOUS
  Administered 2017-08-30: 2 mg via INTRAVENOUS
  Administered 2017-08-30: 1 mg via INTRAVENOUS

## 2017-08-30 MED ORDER — PANTOPRAZOLE SODIUM 40 MG PO TBEC
40.0000 mg | DELAYED_RELEASE_TABLET | Freq: Every day | ORAL | Status: DC
  Administered 2017-09-01 – 2017-09-05 (×5): 40 mg via ORAL
  Filled 2017-08-30 (×5): qty 1

## 2017-08-30 MED ORDER — OXYCODONE HCL 5 MG PO TABS
5.0000 mg | ORAL_TABLET | ORAL | Status: DC | PRN
  Administered 2017-09-05: 5 mg via ORAL
  Filled 2017-08-30 (×2): qty 1

## 2017-08-30 MED ORDER — ACETAMINOPHEN 160 MG/5ML PO SOLN: 650.0000 mg | Freq: Once | ORAL | Status: AC

## 2017-08-30 MED ORDER — ASPIRIN EC 325 MG PO TBEC
325.0000 mg | DELAYED_RELEASE_TABLET | Freq: Every day | ORAL | Status: DC
  Administered 2017-08-31 – 2017-09-04 (×5): 325 mg via ORAL
  Filled 2017-08-30 (×6): qty 1

## 2017-08-30 MED ORDER — PROTAMINE SULFATE 10 MG/ML IV SOLN
INTRAVENOUS | Status: AC
  Filled 2017-08-30: qty 10

## 2017-08-30 MED ORDER — ORAL CARE MOUTH RINSE
15.0000 mL | OROMUCOSAL | Status: DC
  Administered 2017-08-30 – 2017-08-31 (×7): 15 mL via OROMUCOSAL

## 2017-08-30 MED ORDER — HEPARIN SODIUM (PORCINE) 1000 UNIT/ML IJ SOLN
INTRAMUSCULAR | Status: DC | PRN
  Administered 2017-08-30: 31000 [IU] via INTRAVENOUS
  Administered 2017-08-30: 2000 [IU] via INTRAVENOUS

## 2017-08-30 MED ORDER — INSULIN REGULAR BOLUS VIA INFUSION
0.0000 [IU] | Freq: Three times a day (TID) | INTRAVENOUS | Status: DC
  Filled 2017-08-30: qty 10

## 2017-08-30 MED ORDER — PROPOFOL 10 MG/ML IV BOLUS
INTRAVENOUS | Status: DC | PRN
  Administered 2017-08-30: 50 mg via INTRAVENOUS
  Administered 2017-08-30: 30 mg via INTRAVENOUS
  Administered 2017-08-30: 50 mg via INTRAVENOUS

## 2017-08-30 MED ORDER — BISACODYL 5 MG PO TBEC
10.0000 mg | DELAYED_RELEASE_TABLET | Freq: Every day | ORAL | Status: DC
  Administered 2017-09-01 – 2017-09-02 (×2): 10 mg via ORAL
  Filled 2017-08-30 (×3): qty 2

## 2017-08-30 MED ORDER — PHENYLEPHRINE HCL 10 MG/ML IJ SOLN
INTRAVENOUS | Status: DC | PRN
  Administered 2017-08-30: 20 ug/min via INTRAVENOUS

## 2017-08-30 MED ORDER — FUROSEMIDE 10 MG/ML IJ SOLN
20.0000 mg | Freq: Once | INTRAMUSCULAR | Status: AC
  Administered 2017-08-30: 20 mg via INTRAVENOUS
  Filled 2017-08-30: qty 2

## 2017-08-30 MED ORDER — LACTATED RINGERS IV SOLN: INTRAVENOUS | Status: DC

## 2017-08-30 MED ORDER — SODIUM CHLORIDE 0.9 % IV SOLN
0.0000 ug/min | INTRAVENOUS | Status: DC
  Filled 2017-08-30: qty 2

## 2017-08-30 MED ORDER — SODIUM CHLORIDE 0.45 % IV SOLN
INTRAVENOUS | Status: DC | PRN
  Administered 2017-08-30: 15:00:00 via INTRAVENOUS

## 2017-08-30 MED ORDER — SODIUM CHLORIDE 0.9 % IV SOLN: INTRAVENOUS | Status: DC

## 2017-08-30 MED ORDER — ACETAMINOPHEN 160 MG/5ML PO SOLN
1000.0000 mg | Freq: Four times a day (QID) | ORAL | Status: AC
  Filled 2017-08-30: qty 40.6

## 2017-08-30 MED ORDER — 0.9 % SODIUM CHLORIDE (POUR BTL) OPTIME
TOPICAL | Status: DC | PRN
  Administered 2017-08-30: 5000 mL

## 2017-08-30 MED ORDER — DOPAMINE-DEXTROSE 3.2-5 MG/ML-% IV SOLN
5.0000 ug/kg/min | INTRAVENOUS | Status: DC
Start: 2017-08-30 — End: 2017-08-31
  Filled 2017-08-30: qty 250

## 2017-08-30 MED ORDER — SODIUM CHLORIDE 0.9 % IV SOLN
0.0000 ug/kg/h | INTRAVENOUS | Status: DC
  Administered 2017-08-30: 0.5 ug/kg/h via INTRAVENOUS
  Filled 2017-08-30: qty 2

## 2017-08-30 MED ORDER — ROCURONIUM BROMIDE 10 MG/ML (PF) SYRINGE
PREFILLED_SYRINGE | INTRAVENOUS | Status: DC | PRN
  Administered 2017-08-30 (×4): 50 mg via INTRAVENOUS

## 2017-08-30 MED ORDER — LACTATED RINGERS IV SOLN
INTRAVENOUS | Status: DC | PRN
  Administered 2017-08-30: 09:00:00 via INTRAVENOUS

## 2017-08-30 MED ORDER — FENTANYL CITRATE (PF) 250 MCG/5ML IJ SOLN
INTRAMUSCULAR | Status: AC
  Filled 2017-08-30: qty 25

## 2017-08-30 MED ORDER — ALBUMIN HUMAN 5 % IV SOLN
250.0000 mL | INTRAVENOUS | Status: AC | PRN
  Administered 2017-08-30 (×3): 250 mL via INTRAVENOUS
  Filled 2017-08-30 (×2): qty 250

## 2017-08-30 MED ORDER — SODIUM CHLORIDE 0.9% FLUSH
3.0000 mL | Freq: Two times a day (BID) | INTRAVENOUS | Status: DC
  Administered 2017-08-31 – 2017-09-03 (×6): 3 mL via INTRAVENOUS

## 2017-08-30 SURGICAL SUPPLY — 102 items
APPLIER CLIP 9.375 SM OPEN (CLIP)
BAG DECANTER FOR FLEXI CONT (MISCELLANEOUS) ×5 IMPLANT
BANDAGE ACE 4X5 VEL STRL LF (GAUZE/BANDAGES/DRESSINGS) ×5 IMPLANT
BANDAGE ACE 6X5 VEL STRL LF (GAUZE/BANDAGES/DRESSINGS) ×5 IMPLANT
BASKET HEART  (ORDER IN 25'S) (MISCELLANEOUS) ×1
BASKET HEART (ORDER IN 25'S) (MISCELLANEOUS) ×1
BASKET HEART (ORDER IN 25S) (MISCELLANEOUS) ×3 IMPLANT
BLADE STERNUM SYSTEM 6 (BLADE) ×5 IMPLANT
BLADE SURG 15 STRL LF DISP TIS (BLADE) IMPLANT
BLADE SURG 15 STRL SS (BLADE)
BNDG GAUZE ELAST 4 BULKY (GAUZE/BANDAGES/DRESSINGS) ×5 IMPLANT
CANISTER SUCT 3000ML PPV (MISCELLANEOUS) ×5 IMPLANT
CANNULA EZ GLIDE AORTIC 21FR (CANNULA) ×5 IMPLANT
CATH CPB KIT HENDRICKSON (MISCELLANEOUS) ×5 IMPLANT
CATH ROBINSON RED A/P 18FR (CATHETERS) ×5 IMPLANT
CATH THORACIC 36FR (CATHETERS) ×5 IMPLANT
CATH THORACIC 36FR RT ANG (CATHETERS) ×5 IMPLANT
CLIP APPLIE 9.375 SM OPEN (CLIP) IMPLANT
CLIP FOGARTY SPRING 6M (CLIP) ×5 IMPLANT
CLIP VESOCCLUDE MED 24/CT (CLIP) IMPLANT
CLIP VESOCCLUDE SM WIDE 24/CT (CLIP) ×15 IMPLANT
COVER MAYO STAND STRL (DRAPES) ×5 IMPLANT
CRADLE DONUT ADULT HEAD (MISCELLANEOUS) ×5 IMPLANT
DERMABOND ADVANCED (GAUZE/BANDAGES/DRESSINGS) ×2
DERMABOND ADVANCED .7 DNX12 (GAUZE/BANDAGES/DRESSINGS) ×3 IMPLANT
DRAPE CARDIOVASCULAR INCISE (DRAPES) ×2
DRAPE EXTREMITY TIBURON (DRAPES) IMPLANT
DRAPE HALF SHEET 40X57 (DRAPES) IMPLANT
DRAPE SLUSH/WARMER DISC (DRAPES) ×5 IMPLANT
DRAPE SRG 135X102X78XABS (DRAPES) ×3 IMPLANT
DRSG AQUACEL AG ADV 3.5X14 (GAUZE/BANDAGES/DRESSINGS) ×5 IMPLANT
DRSG COVADERM 4X14 (GAUZE/BANDAGES/DRESSINGS) ×5 IMPLANT
ELECT REM PT RETURN 9FT ADLT (ELECTROSURGICAL) ×10
ELECTRODE REM PT RTRN 9FT ADLT (ELECTROSURGICAL) ×6 IMPLANT
FELT TEFLON 1X6 (MISCELLANEOUS) ×5 IMPLANT
GAUZE SPONGE 4X4 12PLY STRL (GAUZE/BANDAGES/DRESSINGS) ×10 IMPLANT
GEL ULTRASOUND 20GR AQUASONIC (MISCELLANEOUS) IMPLANT
GLOVE BIOGEL M 6.5 STRL (GLOVE) ×30 IMPLANT
GLOVE BIOGEL M STER SZ 6 (GLOVE) ×30 IMPLANT
GLOVE BIOGEL PI IND STRL 6 (GLOVE) ×9 IMPLANT
GLOVE BIOGEL PI INDICATOR 6 (GLOVE) ×6
GLOVE SURG SIGNA 7.5 PF LTX (GLOVE) ×15 IMPLANT
GOWN STRL REUS W/ TWL LRG LVL3 (GOWN DISPOSABLE) ×24 IMPLANT
GOWN STRL REUS W/ TWL XL LVL3 (GOWN DISPOSABLE) ×6 IMPLANT
GOWN STRL REUS W/TWL LRG LVL3 (GOWN DISPOSABLE) ×16
GOWN STRL REUS W/TWL XL LVL3 (GOWN DISPOSABLE) ×4
HARMONIC SHEARS 14CM COAG (MISCELLANEOUS) IMPLANT
HEMOSTAT POWDER SURGIFOAM 1G (HEMOSTASIS) ×20 IMPLANT
HEMOSTAT SURGICEL 2X14 (HEMOSTASIS) ×5 IMPLANT
INSERT FOGARTY XLG (MISCELLANEOUS) IMPLANT
KIT BASIN OR (CUSTOM PROCEDURE TRAY) ×5 IMPLANT
KIT ROOM TURNOVER OR (KITS) ×5 IMPLANT
KIT SUCTION CATH 14FR (SUCTIONS) ×10 IMPLANT
KIT VASOVIEW HEMOPRO VH 3000 (KITS) ×5 IMPLANT
MARKER GRAFT CORONARY BYPASS (MISCELLANEOUS) ×15 IMPLANT
NS IRRIG 1000ML POUR BTL (IV SOLUTION) ×25 IMPLANT
PACK E OPEN HEART (SUTURE) ×5 IMPLANT
PACK OPEN HEART (CUSTOM PROCEDURE TRAY) ×5 IMPLANT
PAD ARMBOARD 7.5X6 YLW CONV (MISCELLANEOUS) ×10 IMPLANT
PAD ELECT DEFIB RADIOL ZOLL (MISCELLANEOUS) ×5 IMPLANT
PENCIL BUTTON HOLSTER BLD 10FT (ELECTRODE) ×5 IMPLANT
PUNCH AORTIC ROT 4.0MM RCL 40 (MISCELLANEOUS) ×10 IMPLANT
PUNCH AORTIC ROTATE 4.0MM (MISCELLANEOUS) IMPLANT
PUNCH AORTIC ROTATE 4.5MM 8IN (MISCELLANEOUS) IMPLANT
PUNCH AORTIC ROTATE 5MM 8IN (MISCELLANEOUS) IMPLANT
SET CARDIOPLEGIA MPS 5001102 (MISCELLANEOUS) ×5 IMPLANT
SHEARS HARMONIC 9CM CVD (BLADE) IMPLANT
SPONGE LAP 18X18 X RAY DECT (DISPOSABLE) ×5 IMPLANT
SUT BONE WAX W31G (SUTURE) ×5 IMPLANT
SUT ETHILON 3 0 FSL (SUTURE) ×5 IMPLANT
SUT MNCRL AB 4-0 PS2 18 (SUTURE) ×5 IMPLANT
SUT PROLENE 3 0 SH DA (SUTURE) ×5 IMPLANT
SUT PROLENE 4 0 RB 1 (SUTURE)
SUT PROLENE 4 0 SH DA (SUTURE) IMPLANT
SUT PROLENE 4-0 RB1 .5 CRCL 36 (SUTURE) IMPLANT
SUT PROLENE 6 0 C 1 30 (SUTURE) ×10 IMPLANT
SUT PROLENE 7 0 BV 1 (SUTURE) ×30 IMPLANT
SUT PROLENE 7 0 BV1 MDA (SUTURE) ×10 IMPLANT
SUT PROLENE 8 0 BV175 6 (SUTURE) ×5 IMPLANT
SUT STEEL 6MS V (SUTURE) ×5 IMPLANT
SUT STEEL STERNAL CCS#1 18IN (SUTURE) IMPLANT
SUT STEEL SZ 6 DBL 3X14 BALL (SUTURE) ×5 IMPLANT
SUT VIC AB 1 CTX 36 (SUTURE) ×4
SUT VIC AB 1 CTX36XBRD ANBCTR (SUTURE) ×6 IMPLANT
SUT VIC AB 2-0 CT1 27 (SUTURE) ×2
SUT VIC AB 2-0 CT1 TAPERPNT 27 (SUTURE) ×3 IMPLANT
SUT VIC AB 2-0 CTX 27 (SUTURE) IMPLANT
SUT VIC AB 3-0 SH 27 (SUTURE)
SUT VIC AB 3-0 SH 27X BRD (SUTURE) IMPLANT
SUT VIC AB 3-0 X1 27 (SUTURE) IMPLANT
SUT VICRYL 4-0 PS2 18IN ABS (SUTURE) IMPLANT
SYR 50ML SLIP (SYRINGE) IMPLANT
SYSTEM SAHARA CHEST DRAIN ATS (WOUND CARE) ×5 IMPLANT
TAPE CLOTH SURG 4X10 WHT LF (GAUZE/BANDAGES/DRESSINGS) ×5 IMPLANT
TAPE PAPER 2X10 WHT MICROPORE (GAUZE/BANDAGES/DRESSINGS) ×5 IMPLANT
TOWEL GREEN STERILE (TOWEL DISPOSABLE) ×5 IMPLANT
TOWEL GREEN STERILE FF (TOWEL DISPOSABLE) ×5 IMPLANT
TRAY FOLEY SILVER 16FR TEMP (SET/KITS/TRAYS/PACK) ×5 IMPLANT
TUBE FEEDING 8FR 16IN STR KANG (MISCELLANEOUS) ×5 IMPLANT
TUBING INSUFFLATION (TUBING) ×5 IMPLANT
UNDERPAD 30X30 (UNDERPADS AND DIAPERS) ×10 IMPLANT
WATER STERILE IRR 1000ML POUR (IV SOLUTION) ×10 IMPLANT

## 2017-08-30 NOTE — Anesthesia Procedure Notes (Signed)
Arterial Line Insertion Start/End2/13/2019 5:40 PM, 08/30/2017 5:47 PM Performed by: CRNA  Patient location: Pre-op. Preanesthetic checklist: patient identified, IV checked, site marked, risks and benefits discussed, surgical consent, monitors and equipment checked, pre-op evaluation, timeout performed and anesthesia consent Lidocaine 1% used for infiltration Left, radial was placed Catheter size: 20 Fr Hand hygiene performed  and maximum sterile barriers used   Attempts: 2 Procedure performed without using ultrasound guided technique. Following insertion, dressing applied. Post procedure assessment: normal and unchanged

## 2017-08-30 NOTE — Transfer of Care (Signed)
Immediate Anesthesia Transfer of Care Note  Patient: Daniqua Winiecki  Procedure(s) Performed: CORONARY ARTERY BYPASS GRAFTING (CABG) x 5 WITH ENDOSCOPIC HARVESTING OF RIGHT SAPHENOUS VEIN (N/A Chest) TRANSESOPHAGEAL ECHOCARDIOGRAM (TEE) (N/A )  Patient Location: SICU  Anesthesia Type:General  Level of Consciousness: Patient remains intubated per anesthesia plan  Airway & Oxygen Therapy: Patient remains intubated per anesthesia plan  Post-op Assessment: Report given to RN and Post -op Vital signs reviewed and stable  Post vital signs: Reviewed and stable  Last Vitals:  Vitals:   08/30/17 1503 08/30/17 1504  BP:    Pulse: 91 85  Resp: 12 16  Temp:    SpO2: 100% 100%    Last Pain:  Vitals:   08/30/17 0425  TempSrc: Oral  PainSc:          Complications: No apparent anesthesia complications

## 2017-08-30 NOTE — Op Note (Signed)
Michele [name]               ACCOUNT NO.:  [id number]  MEDICAL RECORD NO.:  [id number]  LOCATION:  [facility]                         FACILITY:  [facility]  PHYSICIAN:  Michele [name], M.D.DATE OF BIRTH:  1970-08-12  DATE OF PROCEDURE:  08/30/2017 DATE OF DISCHARGE:                              OPERATIVE REPORT   PREOPERATIVE DIAGNOSIS:  Severe 3-vessel coronary artery disease, status post non-ST-elevation myocardial infarction.  POSTOPERATIVE DIAGNOSIS:  Severe 3-vessel coronary artery disease, status post non-ST-elevation myocardial infarction.  PROCEDURES:   Median sternotomy, extracorporeal circulation, Coronary artery bypass grafting x 5   Left internal mammary artery to left anterior descending,  Saphenous vein graft to first diagonal,  Saphenous vein graft to obtuse marginal 1,  Sequential saphenous vein graft to posterior descending and  posterolateral Endoscopic vein harvest right leg.  SURGEON:  Michele Decent. Dorris Fetch, MD.  ASSISTANT:  Doree Fudge, PA.  ANESTHESIA:  General.  FINDINGS:  Diffusely diseased, poor quality target vessels.  Vein fair quality.  LIMA good quality.  Not a candidate for redo bypass grafting.  CLINICAL NOTE:  Michele [name] is a 47 year old woman with a history of morbid obesity, hypertension, stroke, seizure, and tobacco abuse, who presented with a chief complaint of intermittent chest tightness and shortness of breath.  She ruled in for a non-ST-elevation MI. Echocardiogram showed ejection fraction of 35-40%.  She underwent cardiac catheterization, which showed severe 3-vessel coronary artery disease.  She was referred for coronary artery bypass grafting.  The indications, risks, benefits, and alternatives were discussed in detail with the patient.  She understood and accepted the risks and agreed to proceed.  OPERATIVE NOTE:  Michele [name] was brought to the preoperative holding area on August 30, 2017.  Anesthesia placed a  Swan-Ganz catheter and an arterial blood pressure monitoring line.  She was taken to the operating room, anesthetized, and intubated.  Intravenous antibiotics were administered.  A Foley catheter was placed.  Transesophageal echocardiography was performed. It revealed moderate left ventricular dysfunction with ejection fraction of approximately 40%.  There was mild mitral regurgitation.  No other significant valvular pathology.  There was left ventricular hypertrophy.  The chest, abdomen, and legs were prepped and draped in usual sterile fashion.  An incision was made in the medial aspect of the right leg at the level of the knee.  Greater saphenous vein was identified and was harvested from the lower calf to the groin endoscopically.  The vein was a relatively small caliber, fair quality vessel.  Simultaneously with the vein harvest, a median sternotomy was performed. The left internal mammary artery was harvested using standard technique. This was difficult due to the patient's body habitus.  The mammary artery was a good quality vessel.  2000 units of heparin was administered during the vessel harvest.  The remainder of full heparin dose was given after the vein had been removed from the leg.  The pericardium was opened. After confirming adequate anticoagulation with ACT measurement, the aorta was cannulated via concentric 2-0 Ethibond pledgeted pursestring sutures.  A dual-stage venous cannula was placed via pursestring suture in the right atrial appendage. Cardiopulmonary bypass was initiated.  Flows were maintained per protocol.  The patient  was cooled to 32 degrees Celsius.  The coronary arteries were inspected and anastomotic sites were chosen.  Of note, all the coronaries were very diffusely diseased and heavily calcified vessels.  The conduits were inspected and cut to length.  A foam pad was placed in the pericardium to insulate the heart.  A temperature probe was placed in  the myocardial septum and a cardioplegia cannula was placed in the ascending aorta.  The aorta was crossclamped.  The left ventricle was emptied via the aortic root vent.  Cardiac arrest was achieved with a combination of cold antegrade blood cardioplegia and topical iced saline.  One liter of cardioplegia was administered and there was septal cooling to 10 degrees Celsius.  A reversed saphenous vein graft was placed sequentially to the posterior descending and posterolateral branches of the right coronary artery. These were both small, diffusely diseased vessels.  A 1-mm probe did pass distally in both vessels.  The posterolateral was a better quality vessel than the posterior descending.  Both would be characterized as poor quality.  A side-to-side anastomosis was performed to the posterior descending and end-to-side to the posterolateral.  Both were done with running 7-0 Prolene sutures.  All anastomoses were probed proximally and distally to ensure patency before tying the suture.  Cardioplegia was administered down the graft.  There was satisfactory flow and good hemostasis.  Next, a reversed saphenous vein graft was placed end-to-side to obtuse marginal 1.  This was a large vessel that was heavily calcified.  This did accept a 1.5-mm probe both proximally and distally.  The vein again was fair quality.  An end-to-side anastomosis was performed with a running 7-0 Prolene suture.  A probe passed easily proximally and distally.  Cardioplegia was administered.  There was good flow and good hemostasis.  Additional cardioplegia was administered down the aortic root.  Next, a reversed saphenous vein graft was placed end-to-side to the first diagonal.  Again, this was a heavily calcified, poor quality target. The vein was anastomosed end-to-side with a running 7-0 Prolene suture. The diagonal did accept a 1.5-mm probe distally for a short distance. With cardioplegia administration,  there was good flow and there was good hemostasis.  The left internal mammary artery was brought through a window in the pericardium.  The distal end was beveled and it was anastomosed end-to- side to the distal LAD.  The distal LAD was a 1.5-mm, heavily calcified, diffusely diseased, poor quality target vessel.  The mammary was of good quality and an end-to-side anastomosis was performed with a running 8-0 Prolene suture.  At the completion of the mammary to LAD anastomosis, the bulldog clamp was briefly removed.  Rapid septal rewarming was noted.  The bulldog clamp was replaced and the mammary pedicle was tacked to the epicardial surface of the heart with 6-0 Prolene sutures.  Additional cardioplegia was administered and the vein grafts were cut to length.  The proximal anastomoses for the vein to the right system and the diagonal vein were performed to 4.0 mm punch aortotomies with running 6-0 Prolene sutures.  There was not sufficient room on the ascending aorta to place the OM graft directly.  After completing the second proximal anastomosis, the patient was placed in Trendelenburg position.  Lidocaine was administered.  The aortic root was de-aired and the aortic crossclamp was removed.  The total crossclamp time was 87 minutes.  She spontaneously resumed sinus rhythm and did not require defibrillation.  Bulldog clamps were placed proximally and distally  on the vein to the diagonal.  A longitudinal venotomy was made.  The proximal anastomosis for the OM graft was made end-to-side to this vein with a running 7-0 Prolene suture.  The anastomosis was de-aired prior to reestablishing flow.  While rewarming was completed, all proximal and distal anastomoses were inspected for hemostasis.  Epicardial pacing wires were placed on the right ventricle and right atrium.  A dopamine infusion was initiated at 3 mcg/kg/min.  When the patient had rewarmed to a core temperature of 37 degrees  Celsius, she was weaned from cardiopulmonary bypass on the first attempt.  Total bypass time was 136 minutes.  The initial cardiac index was only 1.5 L/min/m2, but with volume administration and time, the cardiac index improved to greater than 2.  A test dose of protamine was administered and it was well tolerated. The post-bypass transesophageal echocardiogram was essentially unchanged from the prebypass study.  The atrial and aortic cannulae were removed. The remainder of protamine was administered without incident.  The chest was irrigated with warm saline.  Hemostasis was achieved.  The pericardium could not be reapproximated over the heart, but it was closed over the ascending aorta and vein grafts with interrupted 3-0 silk sutures.  The left pleural and mediastinal chest tubes were placed through separate subcostal incisions.  The sternum was closed with a combination of single and double heavy gauge stainless steel wires.  The pectoralis fascia, subcutaneous tissue, and skin were closed in a standard fashion.  All sponge, needle, and instrument counts were correct at the end of the procedure.  The patient was taken from the operating room to the Surgical Intensive Care Unit intubated and in good condition.     Michele DecentSteven C. Dorris FetchHendrickson, M.D.     SCH/MEDQ  D:  08/30/2017  T:  08/30/2017  Job:  [id number]

## 2017-08-30 NOTE — Addendum Note (Signed)
Addendum  created 08/30/17 1748 by Dairl PonderJiang, Advit Trethewey, CRNA   Child order released for a procedure order, Intraprocedure Blocks edited, LDA created via procedure documentation, Sign clinical note

## 2017-08-30 NOTE — Brief Op Note (Signed)
08/27/2017 - 08/30/2017  3:04 PM  PATIENT:  Michele [name]  47 y.o. female  PRE-OPERATIVE DIAGNOSIS:  1. S/P NSTEMI 2. CAD  POST-OPERATIVE DIAGNOSIS:  1.S/P NSTENI 2.CAD  PROCEDURE:  TRANSESOPHAGEAL ECHOCARDIOGRAM (TEE),  MEDIAN STERNOTOMY for  CORONARY ARTERY BYPASS GRAFTING (CABG) x 5  LIMA to LAD,   SVG to DIAGONAL,   SVG to OM, SVG SEQUENTIALLY to PDA and PLB ENDOSCOPIC HARVESTING OF RIGHT GRREATER SAPHENOUS VEIN   SURGEON:  Surgeon(s) and Role:    Loreli SlotHendrickson, Carliss Quast C, MD - Primary  PHYSICIAN ASSISTANT: Doree Fudgeonielle Zimmerman PA-C  ANESTHESIA:   general   EBL:  300 mL    DRAINS: Chest tubes placed in the mediastinal and pleural spaces   COUNTS CORRECT:  YES   DICTATION: .Dragon Dictation  PLAN OF CARE: Admit to inpatient   PATIENT DISPOSITION:  ICU - intubated and hemodynamically stable.   Delay start of Pharmacological VTE agent (>24hrs) due to surgical blood loss or risk of bleeding: yes  BASELINE WEIGHT: 92.7 kg  XC= 87 min CPB= 136 min Diffusely diseased poor quality targets

## 2017-08-30 NOTE — Anesthesia Procedure Notes (Signed)
Arterial Line Insertion Start/End2/13/2019 7:56 AM, 08/30/2017 8:01 AM Performed by: Carmela RimaMartinelli, John F, CRNA, CRNA  Preanesthetic checklist: patient identified, IV checked, site marked, risks and benefits discussed, surgical consent, monitors and equipment checked, pre-op evaluation, timeout performed and anesthesia consent Lidocaine 1% used for infiltration Right, radial was placed Catheter size: 20 G Hand hygiene performed  and maximum sterile barriers used   Attempts: 1 Procedure performed without using ultrasound guided technique. Following insertion, dressing applied and Biopatch. Post procedure assessment: unchanged  Patient tolerated the procedure well with no immediate complications.

## 2017-08-30 NOTE — Progress Notes (Signed)
Per RN on previous shift, when patient reaches 95% O2 sat on 50%, RN is to begin waking the patient in attempt to wean and extubate.    This is per Dr. Donata ClayVan Trigt.

## 2017-08-30 NOTE — Anesthesia Procedure Notes (Addendum)
Central Venous Catheter Insertion Performed by: Lillia Abed, MD, anesthesiologist Start/End2/13/2019 8:25 AM, 08/30/2017 8:35 AM Preanesthetic checklist: patient identified, IV checked, risks and benefits discussed, surgical consent, monitors and equipment checked, pre-op evaluation, timeout performed and anesthesia consent Position: Trendelenburg Lidocaine 1% used for infiltration and patient sedated Hand hygiene performed , maximum sterile barriers used  and Seldinger technique used Central line and PA cath was placed.MAC introducer Swan type:thermodilution PA Cath depth:48 Procedure performed using ultrasound guided technique. Ultrasound Notes:anatomy identified, needle tip was noted to be adjacent to the nerve/plexus identified, no ultrasound evidence of intravascular and/or intraneural injection and image(s) printed for medical record Attempts: 1 Following insertion, line sutured, dressing applied and Biopatch. Patient tolerated the procedure well with no immediate complications.

## 2017-08-30 NOTE — Progress Notes (Signed)
CT surgery p.m. Rounds  Recovery after multivessel CABG Remains intubated sedated Minimal chest tube drainage Hemodynamic stable on low-dose dopamine

## 2017-08-30 NOTE — Anesthesia Postprocedure Evaluation (Signed)
Anesthesia Post Note  Patient: Michele [name]  Procedure(s) Performed: CORONARY ARTERY BYPASS GRAFTING (CABG) x 5 WITH ENDOSCOPIC HARVESTING OF RIGHT SAPHENOUS VEIN (N/A Chest) TRANSESOPHAGEAL ECHOCARDIOGRAM (TEE) (N/A )     Patient location during evaluation: ICU Anesthesia Type: General Level of consciousness: patient remains intubated per anesthesia plan Pain management: pain level controlled Respiratory status: patient remains intubated per anesthesia plan Cardiovascular status: stable Anesthetic complications: no    Last Vitals:  Vitals:   08/30/17 1503 08/30/17 1504  BP:    Pulse: 91 85  Resp: 12 16  Temp:    SpO2: 100% 100%    Last Pain:  Vitals:   08/30/17 0425  TempSrc: Oral  PainSc:                  [name]

## 2017-08-30 NOTE — Anesthesia Preprocedure Evaluation (Addendum)
Anesthesia Evaluation  Patient identified by MRN, date of birth, ID band Patient awake    Reviewed: Allergy & Precautions, NPO status , Patient's Chart, lab work & pertinent test results  Airway Mallampati: II  TM Distance: >3 FB     Dental   Pulmonary neg pulmonary ROS, Current Smoker,    breath sounds clear to auscultation       Cardiovascular hypertension, + angina + Past MI   Rhythm:Regular Rate:Normal     Neuro/Psych    GI/Hepatic negative GI ROS, Neg liver ROS,   Endo/Other  diabetes  Renal/GU negative Renal ROS     Musculoskeletal   Abdominal   Peds  Hematology   Anesthesia Other Findings   Reproductive/Obstetrics                             Anesthesia Physical Anesthesia Plan  ASA: III  Anesthesia Plan: General   Post-op Pain Management:    Induction: Intravenous  PONV Risk Score and Plan: 2 and Treatment may vary due to age or medical condition  Airway Management Planned: Oral ETT  Additional Equipment: Arterial line, PA Cath and TEE  Intra-op Plan:   Post-operative Plan: Post-operative intubation/ventilation  Informed Consent: I have reviewed the patients History and Physical, chart, labs and discussed the procedure including the risks, benefits and alternatives for the proposed anesthesia with the patient or authorized representative who has indicated his/her understanding and acceptance.   Dental advisory given  Plan Discussed with: Anesthesiologist and CRNA  Anesthesia Plan Comments:        Anesthesia Quick Evaluation

## 2017-08-30 NOTE — Progress Notes (Signed)
Dr. Donata ClayVan Trigt was made aware of the patient's 94% saturation on 50% and recent blood gas.    Orders for Lasix 20 mg x1 given and this RN was told to hold weaning the patient for 2 hours and then to proceed with wean.

## 2017-08-30 NOTE — Anesthesia Procedure Notes (Signed)
Procedure Name: Intubation Date/Time: 08/30/2017 9:20 AM Performed by: Glynda Jaeger, CRNA Pre-anesthesia Checklist: Patient identified, Patient being monitored, Timeout performed, Emergency Drugs available and Suction available Patient Re-evaluated:Patient Re-evaluated prior to induction Oxygen Delivery Method: Circle System Utilized Preoxygenation: Pre-oxygenation with 100% oxygen Induction Type: IV induction Ventilation: Mask ventilation without difficulty Laryngoscope Size: Mac and 3 Grade View: Grade I Tube type: Oral Tube size: 8.0 mm Number of attempts: 1 Airway Equipment and Method: Stylet Placement Confirmation: ETT inserted through vocal cords under direct vision,  positive ETCO2 and breath sounds checked- equal and bilateral Secured at: 22 cm Tube secured with: Tape Dental Injury: Teeth and Oropharynx as per pre-operative assessment

## 2017-08-30 NOTE — Progress Notes (Signed)
  Echocardiogram Echocardiogram Transesophageal has been performed.  Janalyn HarderWest, Jamon Hayhurst R 08/30/2017, 10:28 AM

## 2017-08-30 NOTE — Interval H&P Note (Signed)
History and Physical Interval Note:  08/30/2017 8:12 AM  Michele [name]  has presented today for surgery, with the diagnosis of CAD  The various methods of treatment have been discussed with the patient and family. After consideration of risks, benefits and other options for treatment, the patient has consented to  Procedure(s): CORONARY ARTERY BYPASS GRAFTING (CABG) (N/A) RADIAL ARTERY HARVEST (Left) TRANSESOPHAGEAL ECHOCARDIOGRAM (TEE) (N/A) as a surgical intervention .  The patient's history has been reviewed, patient examined, no change in status, stable for surgery.  I have reviewed the patient's chart and labs.  Questions were answered to the patient's satisfaction.     Loreli SlotSteven C Brayln Duque

## 2017-08-31 ENCOUNTER — Inpatient Hospital Stay (HOSPITAL_COMMUNITY): Payer: 59

## 2017-08-31 ENCOUNTER — Encounter (HOSPITAL_COMMUNITY): Payer: Self-pay | Admitting: Thoracic Surgery (Cardiothoracic Vascular Surgery)

## 2017-08-31 DIAGNOSIS — Z951 Presence of aortocoronary bypass graft: Secondary | ICD-10-CM

## 2017-08-31 LAB — GLUCOSE, CAPILLARY
GLUCOSE-CAPILLARY: 90 mg/dL (ref 65–99)
GLUCOSE-CAPILLARY: 104 mg/dL — AB (ref 65–99)
GLUCOSE-CAPILLARY: 108 mg/dL — AB (ref 65–99)
GLUCOSE-CAPILLARY: 107 mg/dL — AB (ref 65–99)
GLUCOSE-CAPILLARY: 123 mg/dL — AB (ref 65–99)
GLUCOSE-CAPILLARY: 135 mg/dL — AB (ref 65–99)
Glucose-Capillary: 103 mg/dL — ABNORMAL HIGH (ref 65–99)
Glucose-Capillary: 106 mg/dL — ABNORMAL HIGH (ref 65–99)
Glucose-Capillary: 107 mg/dL — ABNORMAL HIGH (ref 65–99)
Glucose-Capillary: 109 mg/dL — ABNORMAL HIGH (ref 65–99)
Glucose-Capillary: 111 mg/dL — ABNORMAL HIGH (ref 65–99)
Glucose-Capillary: 116 mg/dL — ABNORMAL HIGH (ref 65–99)
Glucose-Capillary: 124 mg/dL — ABNORMAL HIGH (ref 65–99)
Glucose-Capillary: 125 mg/dL — ABNORMAL HIGH (ref 65–99)
Glucose-Capillary: 130 mg/dL — ABNORMAL HIGH (ref 65–99)
Glucose-Capillary: 165 mg/dL — ABNORMAL HIGH (ref 65–99)
Glucose-Capillary: 97 mg/dL (ref 65–99)

## 2017-08-31 LAB — POCT I-STAT, CHEM 8
BUN: 15 mg/dL (ref 6–20)
CALCIUM ION: 1.1 mmol/L — AB (ref 1.15–1.40)
Chloride: 106 mmol/L (ref 101–111)
Creatinine, Ser: 1.4 mg/dL — ABNORMAL HIGH (ref 0.44–1.00)
GLUCOSE: 120 mg/dL — AB (ref 65–99)
HCT: 25 % — ABNORMAL LOW (ref 36.0–46.0)
HEMOGLOBIN: 8.5 g/dL — AB (ref 12.0–15.0)
POTASSIUM: 3.8 mmol/L (ref 3.5–5.1)
SODIUM: 138 mmol/L (ref 135–145)
TCO2: 21 mmol/L — ABNORMAL LOW (ref 22–32)

## 2017-08-31 LAB — POCT I-STAT 3, ART BLOOD GAS (G3+)
ACID-BASE DEFICIT: 5 mmol/L — AB (ref 0.0–2.0)
ACID-BASE DEFICIT: 5 mmol/L — AB (ref 0.0–2.0)
Acid-base deficit: 3 mmol/L — ABNORMAL HIGH (ref 0.0–2.0)
BICARBONATE: 18.7 mmol/L — AB (ref 20.0–28.0)
BICARBONATE: 17.5 mmol/L — AB (ref 20.0–28.0)
Bicarbonate: 20.4 mmol/L (ref 20.0–28.0)
O2 SAT: 93 %
O2 SAT: 95 %
O2 Saturation: 95 %
PCO2 ART: 30.3 mmHg — AB (ref 32.0–48.0)
PH ART: 7.466 — AB (ref 7.350–7.450)
PH ART: 7.439 (ref 7.350–7.450)
PO2 ART: 68 mmHg — AB (ref 83.0–108.0)
PO2 ART: 73 mmHg — AB (ref 83.0–108.0)
Patient temperature: 37.7
Patient temperature: 37.4
TCO2: 20 mmol/L — ABNORMAL LOW (ref 22–32)
TCO2: 18 mmol/L — ABNORMAL LOW (ref 22–32)
TCO2: 21 mmol/L — ABNORMAL LOW (ref 22–32)
pCO2 arterial: 29.9 mmHg — ABNORMAL LOW (ref 32.0–48.0)
pCO2 arterial: 24.4 mmHg — ABNORMAL LOW (ref 32.0–48.0)
pH, Arterial: 7.406 (ref 7.350–7.450)
pO2, Arterial: 73 mmHg — ABNORMAL LOW (ref 83.0–108.0)

## 2017-08-31 LAB — BASIC METABOLIC PANEL
ANION GAP: 11 (ref 5–15)
BUN: 15 mg/dL (ref 6–20)
CALCIUM: 7.9 mg/dL — AB (ref 8.9–10.3)
CO2: 19 mmol/L — ABNORMAL LOW (ref 22–32)
Chloride: 108 mmol/L (ref 101–111)
Creatinine, Ser: 1.36 mg/dL — ABNORMAL HIGH (ref 0.44–1.00)
GFR, EST AFRICAN AMERICAN: 53 mL/min — AB (ref >60)
GFR, EST NON AFRICAN AMERICAN: 46 mL/min — AB (ref >60)
Glucose, Bld: 104 mg/dL — ABNORMAL HIGH (ref 65–99)
Potassium: 3.8 mmol/L (ref 3.5–5.1)
SODIUM: 138 mmol/L (ref 135–145)

## 2017-08-31 LAB — CBC
HCT: 24.8 % — ABNORMAL LOW (ref 36.0–46.0)
HCT: 23.2 % — ABNORMAL LOW (ref 36.0–46.0)
Hemoglobin: 7.8 g/dL — ABNORMAL LOW (ref 12.0–15.0)
Hemoglobin: 7.5 g/dL — ABNORMAL LOW (ref 12.0–15.0)
MCH: 23.7 pg — ABNORMAL LOW (ref 26.0–34.0)
MCH: 24.3 pg — AB (ref 26.0–34.0)
MCHC: 31.5 g/dL (ref 30.0–36.0)
MCHC: 32.3 g/dL (ref 30.0–36.0)
MCV: 75.4 fL — ABNORMAL LOW (ref 78.0–100.0)
MCV: 75.1 fL — AB (ref 78.0–100.0)
PLATELETS: 231 10*3/uL (ref 150–400)
PLATELETS: 225 10*3/uL (ref 150–400)
RBC: 3.29 MIL/uL — ABNORMAL LOW (ref 3.87–5.11)
RBC: 3.09 MIL/uL — ABNORMAL LOW (ref 3.87–5.11)
RDW: 16.1 % — AB (ref 11.5–15.5)
RDW: 16.7 % — AB (ref 11.5–15.5)
WBC: 13.4 10*3/uL — AB (ref 4.0–10.5)
WBC: 15.1 10*3/uL — ABNORMAL HIGH (ref 4.0–10.5)

## 2017-08-31 LAB — CREATININE, SERUM
Creatinine, Ser: 1.54 mg/dL — ABNORMAL HIGH (ref 0.44–1.00)
GFR calc Af Amer: 46 mL/min — ABNORMAL LOW (ref >60)
GFR calc non Af Amer: 39 mL/min — ABNORMAL LOW (ref >60)

## 2017-08-31 LAB — MAGNESIUM
MAGNESIUM: 2.8 mg/dL — AB (ref 1.7–2.4)
Magnesium: 2.7 mg/dL — ABNORMAL HIGH (ref 1.7–2.4)

## 2017-08-31 MED ORDER — INSULIN ASPART 100 UNIT/ML ~~LOC~~ SOLN
0.0000 [IU] | SUBCUTANEOUS | Status: DC
  Administered 2017-08-31 (×2): 2 [IU] via SUBCUTANEOUS
  Administered 2017-08-31: 4 [IU] via SUBCUTANEOUS
  Administered 2017-08-31: 2 [IU] via SUBCUTANEOUS
  Administered 2017-09-01: 4 [IU] via SUBCUTANEOUS

## 2017-08-31 MED ORDER — FUROSEMIDE 10 MG/ML IJ SOLN
40.0000 mg | Freq: Once | INTRAMUSCULAR | Status: AC
  Administered 2017-08-31: 40 mg via INTRAVENOUS
  Filled 2017-08-31: qty 4

## 2017-08-31 MED ORDER — METOPROLOL TARTRATE 5 MG/5ML IV SOLN
INTRAVENOUS | Status: AC
  Administered 2017-08-31: 2.5 mg via INTRAVENOUS
  Filled 2017-08-31: qty 5

## 2017-08-31 MED ORDER — HYDRALAZINE HCL 20 MG/ML IJ SOLN
10.0000 mg | INTRAMUSCULAR | Status: DC | PRN
  Administered 2017-09-01 – 2017-09-03 (×5): 10 mg via INTRAVENOUS
  Filled 2017-08-31 (×5): qty 1

## 2017-08-31 MED ORDER — METOPROLOL TARTRATE 25 MG PO TABS
25.0000 mg | ORAL_TABLET | Freq: Two times a day (BID) | ORAL | Status: DC
  Administered 2017-08-31 – 2017-09-01 (×2): 25 mg via ORAL
  Filled 2017-08-31 (×2): qty 1

## 2017-08-31 MED ORDER — POTASSIUM CHLORIDE CRYS ER 20 MEQ PO TBCR
40.0000 meq | EXTENDED_RELEASE_TABLET | Freq: Once | ORAL | Status: AC
  Administered 2017-08-31: 40 meq via ORAL
  Filled 2017-08-31: qty 2

## 2017-08-31 MED ORDER — ORAL CARE MOUTH RINSE
15.0000 mL | Freq: Two times a day (BID) | OROMUCOSAL | Status: DC
  Administered 2017-08-31 – 2017-09-02 (×4): 15 mL via OROMUCOSAL

## 2017-08-31 MED ORDER — METOPROLOL TARTRATE 25 MG/10 ML ORAL SUSPENSION: 25.0000 mg | Freq: Two times a day (BID) | ORAL | Status: DC

## 2017-08-31 MED ORDER — AMLODIPINE BESYLATE 10 MG PO TABS
10.0000 mg | ORAL_TABLET | Freq: Every day | ORAL | Status: DC
  Administered 2017-08-31 – 2017-09-04 (×5): 10 mg via ORAL
  Filled 2017-08-31 (×5): qty 1

## 2017-08-31 MED ORDER — ENOXAPARIN SODIUM 40 MG/0.4ML ~~LOC~~ SOLN
40.0000 mg | Freq: Every day | SUBCUTANEOUS | Status: DC
  Administered 2017-08-31 – 2017-09-04 (×5): 40 mg via SUBCUTANEOUS
  Filled 2017-08-31 (×5): qty 0.4

## 2017-08-31 MED ORDER — POTASSIUM CHLORIDE 10 MEQ/50ML IV SOLN
10.0000 meq | INTRAVENOUS | Status: AC
  Administered 2017-08-31 (×3): 10 meq via INTRAVENOUS
  Filled 2017-08-31 (×3): qty 50

## 2017-08-31 MED ORDER — INSULIN DETEMIR 100 UNIT/ML ~~LOC~~ SOLN
20.0000 [IU] | Freq: Two times a day (BID) | SUBCUTANEOUS | Status: DC
  Administered 2017-08-31 – 2017-09-03 (×8): 20 [IU] via SUBCUTANEOUS
  Filled 2017-08-31 (×10): qty 0.2

## 2017-08-31 NOTE — Procedures (Signed)
Extubation Procedure Note  Patient Details:   Name: Michele [name] DOB: [birth year] MRN: [id number]   Airway Documentation:     Evaluation  O2 sats: stable throughout Complications: No apparent complications Patient did tolerate procedure well. Bilateral Breath Sounds: Rhonchi   Yes pt able to vocalize.   Pt extubated per rapid wean with NIF of -20 cm H2O and VC of 0.8 L but very hard to obtain due to patient anxious. Pt not coughing when asked unless pt told multiple times. Dr. Dorris FetchHendrickson aware and pt would talk for him. Pt placed on 5L and Spo2 93% at this time. Unaware if patient having a hard time understanding but will follow commands. Pt did not perform IS well she was trying to blow and RN attempted as well.    Michele [name], Michele [name] 08/31/2017, 8:40 AM

## 2017-08-31 NOTE — Progress Notes (Signed)
Flipped to full support due to pt high anxiety. High systolic pressures and increased RR in the 40s.

## 2017-08-31 NOTE — Progress Notes (Signed)
Started Rapid wean process at 0403 and pt tolerated rate of 4/40% and switched pt to CPAP/PS of 5/5 and pt tolerated well. However, when I tried to assist pt with vital mechanics pt became very anxious and arterial BP 200 systolic and RRs in the 40's. RT switched pt to full support(500, 12, +5, and 40%) and RN resumed sedation. RT to cont to monitor and attempt wean process again at later time.

## 2017-08-31 NOTE — Progress Notes (Signed)
Patient ID: Michele [name], female   DOB: 1971/07/11, 47 y.o.   MRN: [id number] [facility] Evening Rounds:  Hypertensive with MAP 108 off dopamine.  Will resume Michele [name] Norvasc 10 mg daily now and increase Lopressor to 25 bid. Hydralazine prn. If creat stable may be able to resume Cozaar tomorrow.  Diuresed some this am with 40 mg lasix. She is still puffy so will give Michele [name] another dose tonight and replace K+.  Was up in chair for 2 hrs today.

## 2017-08-31 NOTE — Progress Notes (Signed)
Following Dr. Zenaida NieceVan Trigt's orders, the precedex was weaned and turned off around 0200.  Patient was appropriate and following commands. RN communicated to RT about weaning the patient.  Patient was anxious so precedex was resumed at low dose.  Blood gas was borderline.  As RT was attempting to mechanics, the patient began to have an anxiety attack with respirations in the 40s and blood pressures in the 200s systolic on 100 mcg of nitro. The wean was halted until morning for this reason.  Patient was placed on 40% 5, 12, and 500.  Her sats remained in the upper 80s.  Patient's FIO2 was increased to 50%.  RN will continue to monitor patient.

## 2017-08-31 NOTE — Care Management Note (Addendum)
Case Management Note  Patient Details  Name: Michele [name] MRN: [id number] Date of Birth: 1971/06/11  Subjective/Objective:  From home with mom and children, pta indep, has PCP and medication coverage.[place], s/p cath with 3VD, POD 1 CABG, for extubation today, cr up a little, conts on dopamine, lasix.                    Action/Plan: NCM will follow for transition of care.  Expected Discharge Date:                  Expected Discharge Plan:     In-House Referral:  Chaplain  Discharge planning Services  CM Consult  Post Acute Care Choice:    Choice offered to:     DME Arranged:    DME Agency:     HH Arranged:    HH Agency:     Status of Service:  In process, will continue to follow  If discussed at Long Length of Stay Meetings, dates discussed:    Additional Comments:  [name], [name], RN 08/31/2017, 11:17 AM

## 2017-08-31 NOTE — Progress Notes (Signed)
Progress Note  Patient Name: Michele [name] Date of Encounter: 08/31/2017  Primary Cardiologist: New  Subjective   Intubated; opens eyes; anxious; will not respond to commands  Inpatient Medications    Scheduled Meds: . acetaminophen  1,000 mg Oral Q6H   Or  . acetaminophen (TYLENOL) oral liquid 160 mg/5 mL  1,000 mg Per Tube Q6H  . aspirin EC  325 mg Oral Daily   Or  . aspirin  324 mg Per Tube Daily  . atorvastatin  80 mg Oral q1800  . bisacodyl  10 mg Oral Daily   Or  . bisacodyl  10 mg Rectal Daily  . chlorhexidine gluconate (MEDLINE KIT)  15 mL Mouth Rinse BID  . docusate sodium  200 mg Oral Daily  . insulin regular  0-10 Units Intravenous TID WC  . levETIRAcetam  750 mg Per Tube BID  . mouth rinse  15 mL Mouth Rinse 10 times per day  . metoprolol tartrate  12.5 mg Oral BID   Or  . metoprolol tartrate  12.5 mg Per Tube BID  . [START ON 09/01/2017] pantoprazole  40 mg Oral Daily  . sodium chloride flush  3 mL Intravenous Q12H   Continuous Infusions: . sodium chloride 20 mL/hr at 08/30/17 2000  . sodium chloride    . sodium chloride 20 mL/hr at 08/30/17 1510  . albumin human    . cefUROXime (ZINACEF)  IV Stopped (08/31/17 0545)  . dexmedetomidine (PRECEDEX) IV infusion 0.2 mcg/kg/hr (08/31/17 0600)  . DOPamine 3 mcg/kg/min (08/31/17 0545)  . insulin (NOVOLIN-R) infusion 1.4 Units/hr (08/31/17 0603)  . lactated ringers 20 mL/hr at 08/30/17 2000  . lactated ringers 20 mL/hr at 08/30/17 2000  . nitroGLYCERIN 100 mcg/min (08/31/17 0245)  . phenylephrine (NEO-SYNEPHRINE) Adult infusion 10 mcg/min (08/30/17 2025)  . potassium chloride     PRN Meds: sodium chloride, albumin human, metoprolol tartrate, midazolam, morphine injection, ondansetron (ZOFRAN) IV, oxyCODONE, sodium chloride flush, traMADol   Vital Signs    Vitals:   08/31/17 0530 08/31/17 0545 08/31/17 0600 08/31/17 0615  BP:   (!) 110/56   Pulse: 96 88 83 89  Resp: (!) 24 (!) 32 (!) 26 19  Temp: 99.9  F (37.7 C) 99.9 F (37.7 C) 99.7 F (37.6 C) 99.7 F (37.6 C)  TempSrc:      SpO2: 93% 98% 91% 97%  Weight:      Height:        Intake/Output Summary (Last 24 hours) at 08/31/2017 0653 Last data filed at 08/31/2017 0600 Gross per 24 hour  Intake 5988.95 ml  Output 1715 ml  Net 4273.95 ml   Filed Weights   08/27/17 1140 08/27/17 1717 08/30/17 0425  Weight: 206 lb (93.4 kg) 205 lb 0.4 oz (93 kg) 204 lb 6.4 oz (92.7 kg)    Telemetry    NSR- Personally Reviewed  Physical Exam   GEN: Obese intubated Neck: No JVD Cardiac: RRR Respiratory: CTA anteriorly; s/p sternotomy GI: Soft, ND MS: No edema; s/p vein harvest Neuro:  will not respond to questions; moves all ext   Labs    Chemistry Recent Labs  Lab 08/27/17 1155  08/29/17 0523 08/30/17 0458  08/30/17 1521 08/30/17 2058 08/30/17 2105 08/31/17 0317  NA 134*   < > 135 136   < > 140  --  138 138  K 2.5*   < > 3.1* 3.2*   < > 3.8  --  4.2 3.8  CL 98*   < >  102 102   < > 103  --  105 108  CO2 28   < > 23 23  --   --   --   --  19*  GLUCOSE 289*   < > 212* 214*   < > 175*  --  102* 104*  BUN 9   < > 9 11   < > 10  --  12 15  CREATININE 1.02*   < > 1.01* 1.07*   < > 1.10* 1.20* 1.10* 1.36*  CALCIUM 8.9   < > 8.6* 8.6*  --   --   --   --  7.9*  PROT 7.6  --   --   --   --   --   --   --   --   ALBUMIN 3.2*  --   --   --   --   --   --   --   --   AST 23  --   --   --   --   --   --   --   --   ALT 19  --   --   --   --   --   --   --   --   ALKPHOS 62  --   --   --   --   --   --   --   --   BILITOT 0.6  --   --   --   --   --   --   --   --   GFRNONAA >60   < > >60 >60  --   --  53*  --  46*  GFRAA >60   < > >60 >60  --   --  >60  --  53*  ANIONGAP 8   < > 10 11  --   --   --   --  11   < > = values in this interval not displayed.     Hematology Recent Labs  Lab 08/30/17 1519  08/30/17 2058 08/30/17 2105 08/31/17 0317  WBC 16.3*  --  14.3*  --  13.4*  RBC 3.45*  --  3.60*  --  3.29*  HGB 8.2*   < >  8.5* 9.2* 7.8*  HCT 26.3*   < > 27.4* 27.0* 24.8*  MCV 76.2*  --  76.1*  --  75.4*  MCH 23.8*  --  23.6*  --  23.7*  MCHC 31.2  --  31.0  --  31.5  RDW 16.4*  --  16.1*  --  16.1*  PLT 208  --  220  --  231   < > = values in this interval not displayed.    Cardiac Enzymes Recent Labs  Lab 08/27/17 1155 08/27/17 1455 08/27/17 1828 08/28/17 0646  TROPONINI 0.42* 1.27* 1.80* 1.83*    Patient Profile     47 y.o. female admitted with hypertensive urgency (not taking meds) and NSTEMI. Echo with EF 35-40, grade 2 DD, mild LAENow s/p CABG  Assessment & Plan    1 CAD s/p CABG-continue ASA, metoprolol and statin; wean dopamine to off; wean vent as tolerated.  2 ischemic cardiomyopathy-likely related to both ischemia and uncontrolled hypertension at time of presentation; continue beta blocker and advance as tolerated; resume Hydralazine/nitrates as BP improves; resume ARB when it is clear renal function stable.  3 Acute systolic CHF-will need to resume lasix and spironolactone at DC  when renal function stable.  For questions or updates, please contact [place] Please consult [email or web address] for contact info under Cardiology/STEMI.      Signed, Kirk Ruths, MD  08/31/2017, 6:53 AM

## 2017-08-31 NOTE — Progress Notes (Signed)
1 Day Post-Op Procedure(s) (LRB): CORONARY ARTERY BYPASS GRAFTING (CABG) x 5 WITH ENDOSCOPIC HARVESTING OF RIGHT SAPHENOUS VEIN (N/A) TRANSESOPHAGEAL ECHOCARDIOGRAM (TEE) (N/A) Subjective: still on ventilator. Weaning at present, anxious  Objective: Vital signs in last 24 hours: Temp:  [97 F (36.1 C)-100.4 F (38 C)] 99.5 F (37.5 C) (02/14 0715) Pulse Rate:  [76-133] 91 (02/14 0802) Cardiac Rhythm: Normal sinus rhythm;Sinus tachycardia (02/14 0400) Resp:  [11-32] 32 (02/14 0802) BP: (101-175)/(56-109) 172/74 (02/14 0802) SpO2:  [90 %-100 %] 94 % (02/14 0802) Arterial Line BP: (93-219)/(47-125) 122/50 (02/14 0715) FiO2 (%):  [40 %-70 %] 40 % (02/14 0802)  Hemodynamic parameters for last 24 hours: PAP: (16-41)/(7-25) 18/9 CVP:  [10 mmHg] 10 mmHg CO:  [3.4 L/min-4.3 L/min] 3.8 L/min CI:  [1.8 L/min/m2-2.3 L/min/m2] 2 L/min/m2  Intake/Output from previous day: 02/13 0701 - 02/14 0700 In: 6090.2 [I.V.:4440.2; Blood:150; IV Piggyback:1500] Out: 1715 [Urine:1235; Blood:300; Chest Tube:180] Intake/Output this shift: No intake/output data recorded.  General appearance: cooperative and anxious Neurologic: moves all 4 ext, responds to questions Heart: regular rate and rhythm Lungs: clear anteriorly Abdomen: normal findings: soft, non-tender  Lab Results: Recent Labs    08/30/17 2058 08/30/17 2105 08/31/17 0317  WBC 14.3*  --  13.4*  HGB 8.5* 9.2* 7.8*  HCT 27.4* 27.0* 24.8*  PLT 220  --  231   BMET:  Recent Labs    08/30/17 0458  08/30/17 2105 08/31/17 0317  NA 136   < > 138 138  K 3.2*   < > 4.2 3.8  CL 102   < > 105 108  CO2 23  --   --  19*  GLUCOSE 214*   < > 102* 104*  BUN 11   < > 12 15  CREATININE 1.07*   < > 1.10* 1.36*  CALCIUM 8.6*  --   --  7.9*   < > = values in this interval not displayed.    PT/INR:  Recent Labs    08/30/17 1519  LABPROT 16.3*  INR 1.32   ABG    Component Value Date/Time   PHART 7.406 08/31/2017 0459   HCO3 18.7 (L)  08/31/2017 0459   TCO2 20 (L) 08/31/2017 0459   ACIDBASEDEF 5.0 (H) 08/31/2017 0459   O2SAT 93.0 08/31/2017 0459   CBG (last 3)  Recent Labs    08/31/17 0457 08/31/17 0605 08/31/17 0702  GLUCAP 116* 107* 106*    Assessment/Plan: S/P Procedure(s) (LRB): CORONARY ARTERY BYPASS GRAFTING (CABG) x 5 WITH ENDOSCOPIC HARVESTING OF RIGHT SAPHENOUS VEIN (N/A) TRANSESOPHAGEAL ECHOCARDIOGRAM (TEE) (N/A) -CV- good hemodynamics- dc swan and A line once extubated  RESP- delayed extubation due to not meeting parameters  Extubate this AM  CXR with some edema +/- effusions- diurese  RENAL- creatinine up this AM at 1.36- continue dopamine  Will give Lasix this AM as I/O very positive  ENDO- CBG well controlled with drip- transition to levemir + SSI  Anemia- acute secondary to ABL on chronic- follow  SCD + enoxaparin for DVT prophylaxis   LOS: 4 days    Loreli SlotSteven C Tiffini Blacksher 08/31/2017

## 2017-08-31 NOTE — Progress Notes (Incomplete)
At 0130 precedex was weaned down and then turned off.  Patient began to awaken.  Pt followed commands and was appropriate.  RN notified RT to initiate wean. +`0

## 2017-09-01 ENCOUNTER — Inpatient Hospital Stay (HOSPITAL_COMMUNITY): Payer: 59

## 2017-09-01 LAB — CBC
HCT: 22.7 % — ABNORMAL LOW (ref 36.0–46.0)
Hemoglobin: 7.1 g/dL — ABNORMAL LOW (ref 12.0–15.0)
MCH: 23.7 pg — ABNORMAL LOW (ref 26.0–34.0)
MCHC: 31.3 g/dL (ref 30.0–36.0)
MCV: 75.7 fL — AB (ref 78.0–100.0)
PLATELETS: 223 10*3/uL (ref 150–400)
RBC: 3 MIL/uL — AB (ref 3.87–5.11)
RDW: 16.5 % — AB (ref 11.5–15.5)
WBC: 15.1 10*3/uL — ABNORMAL HIGH (ref 4.0–10.5)

## 2017-09-01 LAB — GLUCOSE, CAPILLARY
GLUCOSE-CAPILLARY: 100 mg/dL — AB (ref 65–99)
GLUCOSE-CAPILLARY: 179 mg/dL — AB (ref 65–99)
Glucose-Capillary: 161 mg/dL — ABNORMAL HIGH (ref 65–99)
Glucose-Capillary: 131 mg/dL — ABNORMAL HIGH (ref 65–99)
Glucose-Capillary: 128 mg/dL — ABNORMAL HIGH (ref 65–99)

## 2017-09-01 LAB — BASIC METABOLIC PANEL
Anion gap: 9 (ref 5–15)
BUN: 16 mg/dL (ref 6–20)
CHLORIDE: 104 mmol/L (ref 101–111)
CO2: 22 mmol/L (ref 22–32)
CREATININE: 1.26 mg/dL — AB (ref 0.44–1.00)
Calcium: 8.1 mg/dL — ABNORMAL LOW (ref 8.9–10.3)
GFR calc non Af Amer: 50 mL/min — ABNORMAL LOW (ref >60)
GFR, EST AFRICAN AMERICAN: 58 mL/min — AB (ref >60)
Glucose, Bld: 165 mg/dL — ABNORMAL HIGH (ref 65–99)
Potassium: 3.7 mmol/L (ref 3.5–5.1)
Sodium: 135 mmol/L (ref 135–145)

## 2017-09-01 MED ORDER — INSULIN ASPART 100 UNIT/ML ~~LOC~~ SOLN: 0.0000 [IU] | Freq: Every day | SUBCUTANEOUS | Status: DC

## 2017-09-01 MED ORDER — METOPROLOL TARTRATE 25 MG/10 ML ORAL SUSPENSION: 50.0000 mg | Freq: Two times a day (BID) | ORAL | Status: DC

## 2017-09-01 MED ORDER — GLIPIZIDE ER 10 MG PO TB24
20.0000 mg | ORAL_TABLET | Freq: Every day | ORAL | Status: DC
  Administered 2017-09-01 – 2017-09-05 (×5): 20 mg via ORAL
  Filled 2017-09-01 (×7): qty 2

## 2017-09-01 MED ORDER — METOPROLOL TARTRATE 25 MG PO TABS
25.0000 mg | ORAL_TABLET | Freq: Once | ORAL | Status: AC
  Administered 2017-09-01: 25 mg via ORAL
  Filled 2017-09-01: qty 1

## 2017-09-01 MED ORDER — POTASSIUM CHLORIDE 10 MEQ/50ML IV SOLN
10.0000 meq | INTRAVENOUS | Status: AC
  Administered 2017-09-01 (×4): 10 meq via INTRAVENOUS
  Filled 2017-09-01 (×4): qty 50

## 2017-09-01 MED ORDER — METOPROLOL TARTRATE 50 MG PO TABS
50.0000 mg | ORAL_TABLET | Freq: Two times a day (BID) | ORAL | Status: DC
  Administered 2017-09-01 – 2017-09-02 (×2): 50 mg via ORAL
  Filled 2017-09-01 (×2): qty 1

## 2017-09-01 MED ORDER — LOSARTAN POTASSIUM 50 MG PO TABS
100.0000 mg | ORAL_TABLET | Freq: Every day | ORAL | Status: DC
  Administered 2017-09-01 – 2017-09-04 (×4): 100 mg via ORAL
  Filled 2017-09-01 (×4): qty 2

## 2017-09-01 MED ORDER — INSULIN ASPART 100 UNIT/ML ~~LOC~~ SOLN
0.0000 [IU] | Freq: Three times a day (TID) | SUBCUTANEOUS | Status: DC
  Administered 2017-09-01: 2 [IU] via SUBCUTANEOUS
  Administered 2017-09-01: 4 [IU] via SUBCUTANEOUS
  Administered 2017-09-01 – 2017-09-03 (×2): 2 [IU] via SUBCUTANEOUS
  Administered 2017-09-05: 5 [IU] via SUBCUTANEOUS
  Administered 2017-09-05: 2 [IU] via SUBCUTANEOUS

## 2017-09-01 MED ORDER — FUROSEMIDE 10 MG/ML IJ SOLN
40.0000 mg | Freq: Two times a day (BID) | INTRAMUSCULAR | Status: AC
  Administered 2017-09-01 – 2017-09-02 (×4): 40 mg via INTRAVENOUS
  Filled 2017-09-01 (×4): qty 4

## 2017-09-01 MED FILL — Electrolyte-R (PH 7.4) Solution: INTRAVENOUS | Qty: 3000 | Status: AC

## 2017-09-01 MED FILL — Sodium Bicarbonate IV Soln 8.4%: INTRAVENOUS | Qty: 50 | Status: AC

## 2017-09-01 MED FILL — Magnesium Sulfate Inj 50%: INTRAMUSCULAR | Qty: 10 | Status: AC

## 2017-09-01 MED FILL — Lidocaine HCl IV Inj 20 MG/ML: INTRAVENOUS | Qty: 5 | Status: AC

## 2017-09-01 MED FILL — Dexmedetomidine HCl in NaCl 0.9% IV Soln 400 MCG/100ML: INTRAVENOUS | Qty: 100 | Status: AC

## 2017-09-01 MED FILL — Mannitol IV Soln 20%: INTRAVENOUS | Qty: 500 | Status: AC

## 2017-09-01 MED FILL — Heparin Sodium (Porcine) Inj 1000 Unit/ML: INTRAMUSCULAR | Qty: 10 | Status: AC

## 2017-09-01 MED FILL — Potassium Chloride Inj 2 mEq/ML: INTRAVENOUS | Qty: 40 | Status: AC

## 2017-09-01 MED FILL — Heparin Sodium (Porcine) Inj 1000 Unit/ML: INTRAMUSCULAR | Qty: 30 | Status: AC

## 2017-09-01 MED FILL — Heparin Sodium (Porcine) Inj 1000 Unit/ML: INTRAMUSCULAR | Qty: 30 | Status: CN

## 2017-09-01 MED FILL — Sodium Chloride IV Soln 0.9%: INTRAVENOUS | Qty: 2000 | Status: AC

## 2017-09-01 NOTE — Plan of Care (Signed)
Pt trying to get up by herself and not steady on feet, instructed pt to call for assistance. Pt slow to respond, drowsy. Bed pads added, fall bracelet and sign up added, yellow socks on. Chair alarm added.

## 2017-09-01 NOTE — Progress Notes (Signed)
Reported to that patient was on 10 L HFNC. Patient is still comfortable on 10 L HFNC. SATs 96%. Will continue to monitor.

## 2017-09-01 NOTE — Progress Notes (Signed)
2 Days Post-Op Procedure(s) (LRB): CORONARY ARTERY BYPASS GRAFTING (CABG) x 5 WITH ENDOSCOPIC HARVESTING OF RIGHT SAPHENOUS VEIN (N/A) TRANSESOPHAGEAL ECHOCARDIOGRAM (TEE) (N/A) Subjective: Some incisional pain, no nausea  Objective: Vital signs in last 24 hours: Temp:  [97.7 F (36.5 C)-99.5 F (37.5 C)] 97.7 F (36.5 C) (02/14 2300) Pulse Rate:  [83-104] 94 (02/15 0700) Cardiac Rhythm: Normal sinus rhythm (02/15 0400) Resp:  [12-32] 16 (02/15 0700) BP: (132-180)/(70-102) 170/94 (02/15 0700) SpO2:  [91 %-99 %] 99 % (02/15 0700) Arterial Line BP: (142-205)/(56-76) 205/76 (02/14 2000) FiO2 (%):  [40 %] 40 % (02/14 0802)  Hemodynamic parameters for last 24 hours: PAP: (20-23)/(11-16) 22/13  Intake/Output from previous day: 02/14 0701 - 02/15 0700 In: 1501.5 [I.V.:1301.5; IV Piggyback:200] Out: 1970 [Urine:1700; Emesis/NG output:200; Chest Tube:70] Intake/Output this shift: No intake/output data recorded.  General appearance: alert, cooperative and no distress Neurologic: alert Heart: regular rate and rhythm Lungs: diminished breath sounds bibasilar Abdomen: normal findings: soft, non-tender  Lab Results: Recent Labs    08/31/17 1540 08/31/17 1554 09/01/17 0430  WBC 15.1*  --  15.1*  HGB 7.5* 8.5* 7.1*  HCT 23.2* 25.0* 22.7*  PLT 225  --  223   BMET:  Recent Labs    08/31/17 0317  08/31/17 1554 09/01/17 0430  NA 138  --  138 135  K 3.8  --  3.8 3.7  CL 108  --  106 104  CO2 19*  --   --  22  GLUCOSE 104*  --  120* 165*  BUN 15  --  15 16  CREATININE 1.36*   < > 1.40* 1.26*  CALCIUM 7.9*  --   --  8.1*   < > = values in this interval not displayed.    PT/INR:  Recent Labs    08/30/17 1519  LABPROT 16.3*  INR 1.32   ABG    Component Value Date/Time   PHART 7.439 08/31/2017 0930   HCO3 20.4 08/31/2017 0930   TCO2 21 (L) 08/31/2017 1554   ACIDBASEDEF 3.0 (H) 08/31/2017 0930   O2SAT 95.0 08/31/2017 0930   CBG (last 3)  Recent Labs     08/31/17 1552 08/31/17 1947 08/31/17 2342  GLUCAP 130* 135* 165*    Assessment/Plan: S/P Procedure(s) (LRB): CORONARY ARTERY BYPASS GRAFTING (CABG) x 5 WITH ENDOSCOPIC HARVESTING OF RIGHT SAPHENOUS VEIN (N/A) TRANSESOPHAGEAL ECHOCARDIOGRAM (TEE) (N/A) -POD # 2  CV- hypertensive- lopressor, amlodipine, losartan  In SR  RESP- bilateral effusions with bibasilar atelectasis- IS, diuresis  RENAL- creatinine better  IV diuresis, supplement K  ENDO- CBG Ok- change to AC/HS SSi  Anemia- acute secondary to ABL on chronic- tolerating so far, follow may end up needing transfusion  Dc chest tubes  OOB, ambulate   LOS: 5 days    Loreli SlotSteven C Hendrickson 09/01/2017

## 2017-09-01 NOTE — Plan of Care (Signed)
Pt continuing to try to get up x 2 without assistance. Pt not steady on feet. Call bell within reach. Pt instructed to call for assistance. Dr. Dorris FetchHendrickson aware.

## 2017-09-01 NOTE — Discharge Instructions (Signed)
Coronary Artery Bypass Grafting, Care After °This sheet gives you information about how to care for yourself after your procedure. Your health care provider may also give you more specific instructions. If you have problems or questions, contact your health care provider. °What can I expect after the procedure? °After the procedure, it is common to have: °· Nausea and a lack of appetite. °· Constipation. °· Weakness and fatigue. °· Depression or irritability. °· Pain or discomfort in your incision areas. ° °Follow these instructions at home: °Medicines °· Take over-the-counter and prescription medicines only as told by your health care provider. Do not stop taking medicines or start any new medicines without approval from your health care provider. °· If you were prescribed an antibiotic medicine, take it as told by your health care provider. Do not stop taking the antibiotic even if you start to feel better. °· Do not drive or use heavy machinery while taking prescription pain medicine. °Incision care °· Follow instructions from your health care provider about how to take care of your incisions. Make sure you: °? Wash your hands with soap and water before you change your bandage (dressing). If soap and water are not available, use hand sanitizer. °? Change your dressing as told by your health care provider. °? Leave stitches (sutures), skin glue, or adhesive strips in place. These skin closures may need to stay in place for 2 weeks or longer. If adhesive strip edges start to loosen and curl up, you may trim the loose edges. Do not remove adhesive strips completely unless your health care provider tells you to do that. °· Keep incision areas clean, dry, and protected. °· Check your incision areas every day for signs of infection. Check for: °? More redness, swelling, or pain. °? More fluid or blood. °? Warmth. °? Pus or a bad smell. °· If incisions were made in your legs: °? Avoid crossing your legs. °? Avoid  sitting for long periods of time. Change positions every 30 minutes. °? Raise (elevate) your legs when you are sitting. °Bathing °· Do not take baths, swim, or use a hot tub until your health care provider approves. °· Only take sponge baths. Pat the incisions dry. Do not rub incisions with a washcloth or towel. °· Ask your health care provider when you can shower. °Eating and drinking °· Eat foods that are high in fiber, such as raw fruits and vegetables, whole grains, beans, and nuts. Meats should be lean cut. Avoid canned, processed, and fried foods. This can help prevent constipation and is a recommended part of a heart-healthy diet. °· Drink enough fluid to keep your urine clear or pale yellow. °· Limit alcohol intake to no more than 1 drink a day for nonpregnant women and 2 drinks a day for men. One drink equals 12 oz of beer, 5 oz of wine, or 1½ oz of hard liquor. °Activity °· Rest and limit your activity as told by your health care provider. You may be instructed to: °? Stop any activity right away if you have chest pain, shortness of breath, irregular heartbeats, or dizziness. Get help right away if you have any of these symptoms. °? Move around frequently for short periods or take short walks as directed by your health care provider. Gradually increase your activities. You may need physical therapy or cardiac rehabilitation to help strengthen your muscles and build your endurance. °? Avoid lifting, pushing, or pulling anything that is heavier than 10 lb (4.5 kg) for at   least 6 weeks or as told by your health care provider. °· Do not drive until your health care provider approves. °· Ask your health care provider when you may return to work. °· Ask your health care provider when you may resume sexual activity. °General instructions °· Do not use any products that contain nicotine or tobacco, such as cigarettes and e-cigarettes. If you need help quitting, ask your health care provider. °· Take 2-3 deep  breaths every few hours during the day, while you recover. This helps expand your lungs and prevent complications like pneumonia after surgery. °· If you were given a device called an incentive spirometer, use it several times a day to practice deep breathing. Support your chest with a pillow or your arms when you take deep breaths or cough. °· Wear compression stockings as told by your health care provider. These stockings help to prevent blood clots and reduce swelling in your legs. °· Weigh yourself every day. This helps identify if your body is holding (retaining) fluid that may make your heart and lungs work harder. °· Keep all follow-up visits as told by your health care provider. This is important. °Contact a health care provider if: °· You have more redness, swelling, or pain around any incision. °· You have more fluid or blood coming from any incision. °· Any incision feels warm to the touch. °· You have pus or a bad smell coming from any incision °· You have a fever. °· You have swelling in your ankles or legs. °· You have pain in your legs. °· You gain 2 lb (0.9 kg) or more a day. °· You are nauseous or you vomit. °· You have diarrhea. °Get help right away if: °· You have chest pain that spreads to your jaw or arms. °· You are short of breath. °· You have a fast or irregular heartbeat. °· You notice a "clicking" in your breastbone (sternum) when you move. °· You have numbness or weakness in your arms or legs. °· You feel dizzy or light-headed. °Summary °· After the procedure, it is common to have pain or discomfort in the incision areas. °· Do not take baths, swim, or use a hot tub until your health care provider approves. °· Gradually increase your activities. You may need physical therapy or cardiac rehabilitation to help strengthen your muscles and build your endurance. °· Weigh yourself every day. This helps identify if your body is holding (retaining) fluid that may make your heart and lungs work  harder. °This information is not intended to replace advice given to you by your health care provider. Make sure you discuss any questions you have with your health care provider. °Document Released: 01/21/2005 Document Revised: 05/23/2016 Document Reviewed: 05/23/2016 °Elsevier Interactive Patient Education © 2018 [employer] ° °Diabetes Mellitus and Nutrition °When you have diabetes (diabetes mellitus), it is very important to have healthy eating habits because your blood sugar (glucose) levels are greatly affected by what you eat and drink. Eating healthy foods in the appropriate amounts, at about the same times every day, can help you: °· Control your blood glucose. °· Lower your risk of heart disease. °· Improve your blood pressure. °· Reach or maintain a healthy weight. ° °Every person with diabetes is different, and each person has different needs for a meal plan. Your health care provider may recommend that you work with a diet and nutrition specialist (dietitian) to make a meal plan that is best for you. Your meal plan   may vary depending on factors such as: °· The calories you need. °· The medicines you take. °· Your weight. °· Your blood glucose, blood pressure, and cholesterol levels. °· Your activity level. °· Other health conditions you have, such as heart or kidney disease. ° °How do carbohydrates affect me? °Carbohydrates affect your blood glucose level more than any other type of food. Eating carbohydrates naturally increases the amount of glucose in your blood. Carbohydrate counting is a method for keeping track of how many carbohydrates you eat. Counting carbohydrates is important to keep your blood glucose at a healthy level, especially if you use insulin or take certain oral diabetes medicines. °It is important to know how many carbohydrates you can safely have in each meal. This is different for every person. Your dietitian can help you calculate how many carbohydrates you should have at each  meal and for snack. °Foods that contain carbohydrates include: °· Bread, cereal, rice, pasta, and crackers. °· Potatoes and corn. °· Peas, beans, and lentils. °· Milk and yogurt. °· Fruit and juice. °· Desserts, such as cakes, cookies, ice cream, and candy. ° °How does alcohol affect me? °Alcohol can cause a sudden decrease in blood glucose (hypoglycemia), especially if you use insulin or take certain oral diabetes medicines. Hypoglycemia can be a life-threatening condition. Symptoms of hypoglycemia (sleepiness, dizziness, and confusion) are similar to symptoms of having too much alcohol. °If your health care provider says that alcohol is safe for you, follow these guidelines: °· Limit alcohol intake to no more than 1 drink per day for nonpregnant women and 2 drinks per day for men. One drink equals 12 oz of beer, 5 oz of wine, or 1½ oz of hard liquor. °· Do not drink on an empty stomach. °· Keep yourself hydrated with water, diet soda, or unsweetened iced tea. °· Keep in mind that regular soda, juice, and other mixers may contain a lot of sugar and must be counted as carbohydrates. ° °What are tips for following this plan? °Reading food labels °· Start by checking the serving size on the label. The amount of calories, carbohydrates, fats, and other nutrients listed on the label are based on one serving of the food. Many foods contain more than one serving per package. °· Check the total grams (g) of carbohydrates in one serving. You can calculate the number of servings of carbohydrates in one serving by dividing the total carbohydrates by 15. For example, if a food has 30 g of total carbohydrates, it would be equal to 2 servings of carbohydrates. °· Check the number of grams (g) of saturated and trans fats in one serving. Choose foods that have low or no amount of these fats. °· Check the number of milligrams (mg) of sodium in one serving. Most people should limit total sodium intake to less than 2,300 mg per  day. °· Always check the nutrition information of foods labeled as "low-fat" or "nonfat". These foods may be higher in added sugar or refined carbohydrates and should be avoided. °· Talk to your dietitian to identify your daily goals for nutrients listed on the label. °Shopping °· Avoid buying canned, premade, or processed foods. These foods tend to be high in fat, sodium, and added sugar. °· Shop around the outside edge of the grocery store. This includes fresh fruits and vegetables, bulk grains, fresh meats, and fresh dairy. °Cooking °· Use low-heat cooking methods, such as baking, instead of high-heat cooking methods like deep frying. °· Cook using   healthy oils, such as olive, canola, or sunflower oil. °· Avoid cooking with butter, cream, or high-fat meats. °Meal planning °· Eat meals and snacks regularly, preferably at the same times every day. Avoid going long periods of time without eating. °· Eat foods high in fiber, such as fresh fruits, vegetables, beans, and whole grains. Talk to your dietitian about how many servings of carbohydrates you can eat at each meal. °· Eat 4-6 ounces of lean protein each day, such as lean meat, chicken, fish, eggs, or tofu. 1 ounce is equal to 1 ounce of meat, chicken, or fish, 1 egg, or 1/4 cup of tofu. °· Eat some foods each day that contain healthy fats, such as avocado, nuts, seeds, and fish. °Lifestyle ° °· Check your blood glucose regularly. °· Exercise at least 30 minutes 5 or more days each week, or as told by your health care provider. °· Take medicines as told by your health care provider. °· Do not use any products that contain nicotine or tobacco, such as cigarettes and e-cigarettes. If you need help quitting, ask your health care provider. °· Work with a counselor or diabetes educator to identify strategies to manage stress and any emotional and social challenges. °What are some questions to ask my health care provider? °· Do I need to meet with a diabetes  educator? °· Do I need to meet with a dietitian? °· What number can I call if I have questions? °· When are the best times to check my blood glucose? °Where to find more information: °· American Diabetes Association: diabetes.org/food-and-fitness/food °· Academy of Nutrition and Dietetics: www.eatright.org/resources/health/diseases-and-conditions/diabetes °· National Institute of Diabetes and Digestive and Kidney Diseases (NIH): www.niddk.nih.gov/health-information/diabetes/overview/diet-eating-physical-activity °Summary °· A healthy meal plan will help you control your blood glucose and maintain a healthy lifestyle. °· Working with a diet and nutrition specialist (dietitian) can help you make a meal plan that is best for you. °· Keep in mind that carbohydrates and alcohol have immediate effects on your blood glucose levels. It is important to count carbohydrates and to use alcohol carefully. °This information is not intended to replace advice given to you by your health care provider. Make sure you discuss any questions you have with your health care provider. °Document Released: 03/31/2005 Document Revised: 08/08/2016 Document Reviewed: 08/08/2016 °Elsevier Interactive Patient Education © 2018 [employer]. ° °

## 2017-09-01 NOTE — Progress Notes (Signed)
CT surgery p.m. Rounds  Comfortable sitting up in chair Ambulated in hallway Blood pressure and rhythm stable Continue current care

## 2017-09-01 NOTE — Plan of Care (Signed)
  Progressing Clinical Measurements: Ability to maintain clinical measurements within normal limits will improve 09/01/2017 1743 - Progressing by Burman RiisKeaton, Chrislynn Mosely P, RN Will remain free from infection 09/01/2017 1743 - Progressing by Burman RiisKeaton, Willet Schleifer P, RN Diagnostic test results will improve 09/01/2017 1743 - Progressing by Burman RiisKeaton, Ladawna Walgren P, RN Respiratory complications will improve 09/01/2017 1743 - Progressing by Burman RiisKeaton, Hannah Crill P, RN Cardiovascular complication will be avoided 09/01/2017 1743 - Progressing by Burman RiisKeaton, Chasitee Zenker P, RN Activity: Risk for activity intolerance will decrease 09/01/2017 1743 - Progressing by Burman RiisKeaton, Devon Kingdon P, RN Nutrition: Adequate nutrition will be maintained 09/01/2017 1743 - Progressing by Burman RiisKeaton, Chazlyn Cude P, RN Coping: Level of anxiety will decrease 09/01/2017 1743 - Progressing by Burman RiisKeaton, Haruko Mersch P, RN Elimination: Will not experience complications related to bowel motility 09/01/2017 1743 - Progressing by Burman RiisKeaton, [place] Doster P, RN Will not experience complications related to urinary retention 09/01/2017 1743 - Progressing by Burman RiisKeaton, Dior Dominik P, RN Pain Managment: General experience of comfort will improve 09/01/2017 1743 - Progressing by Burman RiisKeaton, Ivannia Willhelm P, RN Safety: Ability to remain free from injury will improve 09/01/2017 1743 - Progressing by Burman RiisKeaton, Berel Najjar P, RN Skin Integrity: Risk for impaired skin integrity will decrease 09/01/2017 1743 - Progressing by Burman RiisKeaton, Syrita Dovel P, RN Education: Understanding of CV disease, CV risk reduction, and recovery process will improve 09/01/2017 1743 - Progressing by Burman RiisKeaton, Asael Pann P, RN Health Behavior/Discharge Planning: Ability to safely manage health-related needs after discharge will improve 09/01/2017 1743 - Progressing by Burman RiisKeaton, Jaquarius Seder P, RN Fluid Volume: Ability to maintain a balanced intake and output will improve 09/01/2017 1743 - Progressing by Burman RiisKeaton, Gennavieve Huq P, RN Health Behavior/Discharge Planning: Ability to identify and utilize  available resources and services will improve 09/01/2017 1743 - Progressing by Burman RiisKeaton, Emilo Gras P, RN Ability to manage health-related needs will improve 09/01/2017 1743 - Progressing by Burman RiisKeaton, Nykeria Mealing P, RN Metabolic: Ability to maintain appropriate glucose levels will improve 09/01/2017 1743 - Progressing by Burman RiisKeaton, Andri Prestia P, RN Nutritional: Maintenance of adequate nutrition will improve 09/01/2017 1743 - Progressing by Burman RiisKeaton, Chantil Bari P, RN Tissue Perfusion: Adequacy of tissue perfusion will improve 09/01/2017 1743 - Progressing by Burman RiisKeaton, Locklan Canoy P, RN Education: Ability to demonstrate proper wound care will improve 09/01/2017 1743 - Progressing by Burman RiisKeaton, Terena Bohan P, RN Knowledge of disease or condition will improve 09/01/2017 1743 - Progressing by Burman RiisKeaton, Tulio Facundo P, RN Knowledge of the prescribed therapeutic regimen will improve 09/01/2017 1743 - Progressing by Burman RiisKeaton, Maricus Tanzi P, RN Activity: Risk for activity intolerance will decrease 09/01/2017 1743 - Progressing by Burman RiisKeaton, Brooklynne Pereida P, RN Cardiac: Hemodynamic stability will improve 09/01/2017 1743 - Progressing by Burman RiisKeaton, Kazuto Sevey P, RN Clinical Measurements: Postoperative complications will be avoided or minimized 09/01/2017 1743 - Progressing by Burman RiisKeaton, Havanna Groner P, RN Respiratory: Respiratory status will improve 09/01/2017 1743 - Progressing by Burman RiisKeaton, Sarabelle Genson P, RN Skin Integrity: Wound healing without signs and symptoms of infection 09/01/2017 1743 - Progressing by Burman RiisKeaton, Dash Cardarelli P, RN Risk for impaired skin integrity will decrease 09/01/2017 1743 - Progressing by Burman RiisKeaton, Marketta Valadez P, RN Urinary Elimination: Ability to achieve and maintain adequate renal perfusion and functioning will improve 09/01/2017 1743 - Progressing by Burman RiisKeaton, Shamona Wirtz P, RN

## 2017-09-01 NOTE — Progress Notes (Addendum)
Inpatient Diabetes Program Recommendations  AACE/ADA: New Consensus Statement on Inpatient Glycemic Control (2015)  Target Ranges:  Prepandial:   less than 140 mg/dL      Peak postprandial:   less than 180 mg/dL (1-2 hours)      Critically ill patients:  140 - 180 mg/dL   Results for Michele [name], Michele [name] (MRN [id number]) as of 09/01/2017 08:34  Ref. Range 08/31/2017 14:06 08/31/2017 15:52 08/31/2017 19:47 08/31/2017 23:42 09/01/2017 04:14  Glucose-Capillary Latest Ref Range: 65 - 99 mg/dL 125 (H) 130 (H) 135 (H) 165 (H) 161 (H)   Results for Michele [name], Michele [name] (MRN [id number]) as of 09/01/2017 08:34  Ref. Range 03/07/2017 09:53 08/29/2017 05:23  Hemoglobin A1C Latest Ref Range: 4.8 - 5.6 % 11.4 (H) 10.6 (H)    Home DM Meds: Glipizide 20 mg daily       Metformin 1000 mg daily  Current Orders: Levemir 20 units BID      Novolog Moderate Correction Scale/ SSI (0-15 units) TID AC + HS      Glipizide 20 mg daily       MD- Do you anticipate patient will need insulin at time of discharge?  Will have RNs begin insulin education with patient in case the decision is made to send pt home on insulin.    Addendum: 1:45pm- Met with pt today to discuss elevated A1c and the possibility for the need for insulin for home (DM Coordinator has already met with pt back on 02/12 to discuss the same issue).  Pt was a bit drowsy during our conversation.  Stated she would be willing to take insulin if needed but could not really seem to focus during our conversation. Still getting Morphine for pain prn so this may be affecting her ability to concentrate and stay awake.  Will have DM Coordinator follow up with pt on Monday, Feb 18th.    --Will follow patient during hospitalization--  Wyn Quaker RN, MSN, CDE Diabetes Coordinator Inpatient Glycemic Control Team Team Pager: [phone] (8a-5p)

## 2017-09-01 NOTE — Discharge Summary (Signed)
Physician Discharge Summary       [address]       [place] [address]             [phone]    Patient ID: Michele [name] MRN: [id number] DOB/AGE: [birth year] 47 y.o.  Admit date: 08/27/2017 Discharge date: 09/05/2017  Admission Diagnoses: 1. Unstable angina ([place]) 2. NSTEMI (non-ST elevated myocardial infarction) ([place]) 3. Coronary artery disease  Discharge Diagnoses:  1. S/P CABG x 4 2. Anemia-has a prior history  3. History of hypertension 4.History of hyperlipidemia 5. History of DM 6. History of tobacco abuse 7. History of CVA-RUE numbness 8. History of seizures ([place])   Procedure (s):  LEFT HEART CATH AND CORONARY ANGIOGRAPHY by Dr. Saunders Revel on 08/28/2017:  Conclusion   Conclusions: 1. Significant 3-vessel coronary artery disease. 2. Moderately reduced left ventricular contraction. 3. Moderately to severely elevated left ventricular filling pressure. 4. Severe systemic hypertension.  Recommendations: 1. Cardiac surgery consultation for CABG, given 3-vessel disease, diabetes mellitus, and reduced LVEF. 2. Restart heparin infusion 4 hours after radial sheath removal. 3. Aggressive secondary prevention. 4. Diuresis and improved blood pressure control.  Nelva Bush, MD [place] Pager: [phone]  Diagnostic  Dominance: Right  Left Main  Vessel is large. Vessel is angiographically normal.  Left Anterior Descending  Prox LAD to Mid LAD lesion 70% stenosed  Prox LAD to Mid LAD lesion is 70% stenosed.  First Diagonal Branch  Vessel is moderate in size. There is mild disease in the vessel.  Ost 1st Diag lesion 50% stenosed  Ost 1st Diag lesion is 50% stenosed.  Second Diagonal Branch  Vessel is small in size.  Left Circumflex  Vessel is moderate in size.  Prox Cx lesion 70% stenosed  Prox Cx lesion is 70% stenosed.  Mid Cx to Dist Cx lesion 60% stenosed  Mid Cx to Dist Cx lesion is 60% stenosed.  First Obtuse Marginal Branch    Vessel is small in size.  Second Obtuse Marginal Branch  Vessel is small in size.  Third Obtuse Marginal Branch  Vessel is moderate in size.  Lateral Third Obtuse Marginal Branch  Lat 3rd Mrg lesion 30% stenosed  Lat 3rd Mrg lesion is 30% stenosed.  Right Coronary Artery  Vessel is large. There is moderate diffuse disease throughout the vessel.  Mid RCA lesion 60% stenosed  Mid RCA lesion is 60% stenosed.  Dist RCA lesion 75% stenosed  Dist RCA lesion is 75% stenosed.  Right Posterior Descending Artery  Vessel is small in size.  Ost RPDA to RPDA lesion 50% stenosed  Ost RPDA to RPDA lesion is 50% stenosed.  Right Posterior Atrioventricular Branch  Vessel is small in size. There is moderate disease in the vessel.   Median sternotomy, extracorporeal circulation, coronary artery bypass grafting x5 (left internal mammary artery to left anterior descending, saphenous vein graft to first diagonal, saphenous vein graft to obtuse marginal 1, sequential saphenous vein graft to posterior descending and posterolateral), endoscopic vein harvest of right leg by Dr. Roxan Hockey on 08/30/2017.  History of Presenting Illness: This is a 47 year with a history of hypertension, stroke, seizure, tobacco abuse who presented to [facility] ED on 08/27/2017 with a chief complaint of intermittent chest tightness and shortness of breath. She did have nausea on with at least one episode. Her symptoms are worse with exertion and improve with rest and symptoms appear to be worse at night. She denies pain radiation to jaw or arm, diaphoresis.  She did state that she has not taken blood pressure medication in almost 3 weeks as she "ran out".  EKG showed sinus rhythm, minimal ST depression in lateral leads. Her initial Troponin I was 0.42 and went up to 1.83. She ruled in for a NSTEMI. Echo done yesterday showed LVEF 35-40%, diffuse hypokinesis, and trivial MR. She underwent cardiac catheterization yesterday. Results  showed significant 3 vessel disease, LVEF  40%. Dr. Roxan Hockey has been consulted regarding consideration of coronary artery bypass grafting surgery. Currently, vital signs are stable and she denies chest pain. She is on a Heparin drip.  Patient seen and examined. Dr. Roxan Hockey agreed with above. 47 year history of chest tightness with exertion with recent progression. R/i for non-STEMI. At cath has severe 3 vessel CAD. EF 35-40% by echo. CABG indicated for survival benefit and relief of symptoms.  Dr. Roxan Hockey discussed the general nature of the procedure, the need for general anesthesia, the incisions to be used and the use of cardiopulmonary bypass with [name]. They discussed the expected hospital stay, overall recovery and short and long term outcomes. Potential risks, benefits, and complications of the surgery were discussed with the patient and she agreed to proceed with surgery. She understands CABG is palliative, not curative. Pre operative duplex carotid US showed no significant internal carotid artery stenosis bilaterally. Patient underwent a CABG x 5 on 08/30/2017.  Brief Hospital Course:  The patient was extubated on post operative day one without difficulty. She was weaned off Dopamine drip. She remained afebrile and hemodynamically stable. Gordy Councilman, a line, chest tubes, and foley were removed early in the post operative course. Lopressor was started and titrated accordingly. She was volume over loaded and diuresed. She had anemia post op and has a history of this. Last H and H was 8.5 and 26.5. She was weaned off the insulin drip.  Once she was tolerating a diet, Glipizide XL was restarted. She was continued on Insulin. She will be restarted on Metformin 1000 mg bid at discharge. The patient's glucose remained well controlled.The patient's HGA1C pre op was 10.6 . She will need close medical follow up after discharge. The patient was felt surgically stable for transfer from the ICU to  PCTU for further convalescence on 09/03/2017. She continues to progress with cardiac rehab. She was ambulating on room air. She has been tolerating a diet and has had a bowel movement. Her blood pressure medications were titrated as she continued to be hypertensive despite being on Lopressor 75 mg bid, Catapres 0.3 mg bid, Losartan 100 mg daily, and Amlodipine 10 mg daily. Cardiology was consulted to assist with medications. Per that recommendation, I stopped Amlodipine and started Hydralazine 02/18. Lopressor was also increased to 100 mg bid. Epicardial pacing wires have already been removed. Chest tube sutures will be removed the day of discharge. Right leg sutures (4 Nylon) will be removed in the office after discharge. The patient is felt surgically stable for discharge today.   Latest Vital Signs: Blood pressure (!) 162/109, pulse 93, temperature 98.3 F (36.8 C), temperature source Oral, resp. rate (!) 26, height 5' (1.524 m), weight 211 lb 12.8 oz (96.1 kg), last menstrual period 08/17/2017, SpO2 97 %.  Physical Exam: Cardiovascular: RRR Pulmonary: Slightly diminished at bases Abdomen: Soft, non tender, bowel sounds present. Extremities: Mild bilateral lower extremity edema. Wounds: Clean and dry.  No erythema or signs of infection.   Discharge Condition:Stable and discharged to home.  Recent laboratory studies:  Lab Results  Component Value Date   WBC 10.8 (H) 09/03/2017   HGB 8.5 (L) 09/03/2017   HCT 26.5 (L) 09/03/2017   MCV 76.1 (L) 09/03/2017   PLT 291 09/03/2017   Lab Results  Component Value Date   NA 139 09/04/2017   K 3.9 09/04/2017   CL 108 09/04/2017   CO2 22 09/04/2017   CREATININE 1.11 (H) 09/04/2017   GLUCOSE 86 09/04/2017     Diagnostic Studies: Dg Chest 2 View  Result Date: 09/03/2017 CLINICAL DATA:  Status post CABG EXAM: CHEST  2 VIEW COMPARISON:  09/02/2017 FINDINGS: The cardio pericardial silhouette is enlarged. Bibasilar atelectasis with small to  moderate left pleural effusion and small right pleural effusion. No overt pulmonary edema. The visualized bony structures of the thorax are intact. IMPRESSION: Cardiomegaly with bibasilar atelectasis and small moderate bilateral pleural effusions Electronically Signed   By: Eric  Mansell M.D.   On: 09/03/2017 08:34   Vas Us Doppler Pre Cabg  Result Date: 08/30/2017 PERIOPERATIVE VASCULAR EVALUATION Indications: Pre-CABG.  Examination Guidelines: A complete evaluation includes B-mode imaging, spectral doppler, color doppler, and power doppler as needed of all accessible portions of each vessel. Bilateral testing is considered an integral part of a complete examination. Limited examinations for reoccurring indications may be performed as noted.  Right Carotid Findings: +----------+--------+--------+--------+--------+------------------+           PSV cm/sEDV cm/sStenosisDescribeComments           +----------+--------+--------+--------+--------+------------------+ CCA Prox  83      10                      intimal thickening +----------+--------+--------+--------+--------+------------------+ CCA Distal-61     -11                     intimal thickening +----------+--------+--------+--------+--------+------------------+ ICA Prox  -56     -24                     tortuous           +----------+--------+--------+--------+--------+------------------+ ICA Distal142     -26                     tortuous           +----------+--------+--------+--------+--------+------------------+ ECA       -76     -6                                         +----------+--------+--------+--------+--------+------------------+ +----------+--------+-------+--------+------------+           PSV cm/sEDV cmsDescribeArm Pressure +----------+--------+-------+--------+------------+ Subclavian146                    188          +----------+--------+-------+--------+------------+  +---------+--------+---+--------+---+---------+ VertebralPSV cm/s-76EDV cm/s-29Antegrade +---------+--------+---+--------+---+---------+ Left Carotid Findings: +----------+--------+--------+--------+--------+------------------+           PSV cm/sEDV cm/sStenosisDescribeComments           +----------+--------+--------+--------+--------+------------------+ CCA Prox  116     14                      intimal thickening +----------+--------+--------+--------+--------+------------------+ CCA Distal-85     -14                     intimal thickening +----------+--------+--------+--------+--------+------------------+ ICA Prox  -66     -  24                     tortuous           +----------+--------+--------+--------+--------+------------------+ ICA Distal-35     -13                     tortuous           +----------+--------+--------+--------+--------+------------------+ ECA       -76     0                                          +----------+--------+--------+--------+--------+------------------+ +----------+--------+--------+--------+------------+ SubclavianPSV cm/sEDV cm/sDescribeArm Pressure +----------+--------+--------+--------+------------+           90                      179          +----------+--------+--------+--------+------------+ +---------+--------+---+--------+---+---------+ VertebralPSV cm/s-55EDV cm/s-20Antegrade +---------+--------+---+--------+---+---------+  ABI Findings: +--------+------------------+-----+---------+--------+ Right   Rt Pressure (mmHg)IndexWaveform Comment  +--------+------------------+-----+---------+--------+ Brachial188                    triphasic         +--------+------------------+-----+---------+--------+ PTA     151               0.80 triphasic         +--------+------------------+-----+---------+--------+ DP      184               0.98 triphasic          +--------+------------------+-----+---------+--------+ +--------+------------------+-----+---------+-------+ Left    Lt Pressure (mmHg)IndexWaveform Comment +--------+------------------+-----+---------+-------+ Brachial179                    triphasic        +--------+------------------+-----+---------+-------+ PTA     149               0.79 triphasic        +--------+------------------+-----+---------+-------+ DP      136               0.72 triphasic        +--------+------------------+-----+---------+-------+ +-------+---------------+----------------+ ABI/TBIToday's ABI/TBIPrevious ABI/TBI +-------+---------------+----------------+ Right  0.98                            +-------+---------------+----------------+ Left   0.79                            +-------+---------------+----------------+  Upper Extremity Doppler Findings: +-----------+---------+-----------+---------+-----------+---------+------------+ Comments-RTDoppler-RPressure-RT  Site   Pressure-LTDoppler-LComments-LT             T                                       T                     +-----------+---------+-----------+---------+-----------+---------+------------+                                Subclavia                                                                      n                                     +-----------+---------+-----------+---------+-----------+---------+------------+                                Axillary                                  +-----------+---------+-----------+---------+-----------+---------+------------+            triphasic188        Brachial 179        triphasic             +-----------+---------+-----------+---------+-----------+---------+------------+                                 Forearm                                  +-----------+---------+-----------+---------+-----------+---------+------------+             triphasic            Radial             triphasic             +-----------+---------+-----------+---------+-----------+---------+------------+            triphasic             Ulnar             triphasic             +-----------+---------+-----------+---------+-----------+---------+------------+ Palmar                          Palmar                      Palmar       waveforms                        Arch                       waveforms    remain                                                      remain       within                                                      within       normal                                                      normal         limits with                                                 limits with  radial                                                      radial       compression                                                 comperssion  and are                                                     and are      obliterated                                                 diminished   with ulnar                                                  greater than compression                                                 fifty                                                                    percent with                                                             ulnar                                                                    compression. +-----------+---------+-----------+---------+-----------+---------+------------+                                    Digit                                   +-----------+---------+-----------+---------+-----------+---------+------------+  Final Interpretation: Right Carotid: Velocities in the right ICA are consistent with a 1-39% stenosis. Left Carotid: Velocities in the left ICA are consistent with a 1-39%  stenosis. Vertebrals:  Both vertebral arteries were patent with antegrade flow. Subclavians: Right ABI: Resting right ankle-brachial index is within normal range. No evidence of significant right lower extremity arterial disease. Left ABI: Resting left ankle-brachial index indicates moderate left lower extremity arterial disease.  Electronically signed by Ruta Hinds on 08/30/2017 at 8:09:39 PM.  Final   Discharge Instructions    Amb Referral to Cardiac Rehabilitation   Complete by:  As directed    To [place]   Diagnosis:  CABG   CABG X ___:  5   Ambulatory referral to Nutrition and Diabetic Education   Complete by:  As directed       Discharge Medications: Allergies as of 09/05/2017   No Known Allergies     Medication List    STOP taking these medications   amLODipine 10 MG tablet Commonly known as:  NORVASC   ibuprofen 200 MG tablet Commonly known as:  ADVIL,MOTRIN   omeprazole 20 MG capsule Commonly known as:  PRILOSEC     TAKE these medications   aspirin 325 MG tablet Take 1 tablet (325 mg total) by mouth daily.   atorvastatin 80 MG tablet Commonly known as:  LIPITOR Take 1 tablet (80 mg total) by mouth daily at 6 PM.   blood glucose meter kit and supplies Kit Dispense based on patient and insurance preference. Use up to four times daily as directed. (FOR ICD-9 250.00, 250.01).   calcium carbonate 750 MG chewable tablet Commonly known as:  TUMS EX Chew 2 tablets by mouth as needed for heartburn.   cloNIDine 0.3 MG tablet Commonly known as:  CATAPRES Take 1 tablet (0.3 mg total) by mouth 2 (two) times daily.   ferrous sulfate 325 (65 FE) MG tablet Take 1 tablet (325 mg total) by mouth daily with breakfast.   fluticasone 50 MCG/ACT nasal spray Commonly known as:  FLONASE Place 2 sprays into both nostrils daily. What changed:    when to take this  reasons to take this   furosemide 40 MG tablet Commonly known as:  LASIX Take 1 tablet (40 mg total)  by mouth daily.   glipiZIDE 10 MG 24 hr tablet Commonly known as:  GLUCOTROL XL Take 2 tablets (20 mg total) by mouth daily.   glucose monitoring kit monitoring kit 1 each by Does not apply route as needed for other.   guaiFENesin 200 MG tablet Take 1 tablet (200 mg total) by mouth every 6 (six) hours as needed for cough or to loosen phlegm.   hydrALAZINE 50 MG tablet Commonly known as:  APRESOLINE Take 1 tablet (50 mg total) by mouth every 8 (eight) hours.   levETIRAcetam 750 MG tablet Commonly known as:  KEPPRA Take 1 tablet (750 mg total) by mouth 2 (two) times daily.   lidocaine 2 % solution Commonly known as:  XYLOCAINE Use as directed 15 mLs in the mouth or throat as needed for mouth pain.   losartan 100 MG tablet Commonly known as:  COZAAR Take 1 tablet (100 mg total) by mouth daily.   metFORMIN 1000 MG tablet Commonly known as:  GLUCOPHAGE Take 1,000 mg by mouth daily with breakfast.   metoprolol tartrate 100 MG tablet Commonly known as:  LOPRESSOR Take 1 tablet (100 mg total) by mouth 2 (two) times daily. What changed:    medication strength  how much to take   oxyCODONE 5 MG immediate release tablet Commonly known as:  Oxy IR/ROXICODONE Take 5 mg by mouth every 4-6 hours PRN severe pain.   potassium chloride SA 15 MEQ tablet Commonly known as:  KLOR-CON M15 Take 2 tablets (30 mEq total) by mouth daily.            Durable Medical Equipment  (From admission, onward)        Start     Ordered   09/04/17 1011  For home use only DME Walker rolling  Once    Comments:  Post op CABG  Question:  Patient needs a walker to treat with the following condition  Answer:  Weakness   09/04/17 1011     The patient has been discharged on:   1.Beta Blocker:  Yes [ x  ]                              No   [   ]                              If No, reason:  2.Ace Inhibitor/ARB: Yes [ x  ]                                     No  [    ]                                      If No, reason:  3.Statin:   Yes [ x  ]                  No  [   ]                  If No, reason:  4.Ecasa:  Yes  [x   ]                  No   [   ]                  If No, reason:  Follow Up Appointments: Follow-up Information    Hendrickson, Steven C, MD. Go on 10/03/2017.   Specialty:  Cardiothoracic Surgery Why:  PA/LAT CXR to be taken (at [facility] which is in the same building as Dr. Hendrickson's office) on 10/03/2017 at 12:30 pmat;Appointment time is at 1:00 pm Contact information: [address] [place] [address] [phone]        [name], [name]. Schedule an appointment as soon as possible for a visit in 2 week(s).   Specialty:  General Practice Why:  Call for follow up appointment regarding further diabetes management and surveillance of HGA1AC 10.6 Contact information: [address] [place]  [address] [phone]        Lawrence, Kathryn M, NP. Go on 09/18/2017.   Specialties:  Nurse Practitioner, Radiology, Cardiology Why:  Appointment time is at 3:00 pm   Contact information: [address] [place] [address] [phone]        Nurse. Go on 09/14/2017.   Why:  Appointment is with nurse only to have right leg sutures (4) removed. Office will call with appointment time.  Contact information: [place] [id number]          Signed: Sharalyn Ink [facility] 09/05/2017, 8:53 AM

## 2017-09-02 ENCOUNTER — Inpatient Hospital Stay (HOSPITAL_COMMUNITY): Payer: 59

## 2017-09-02 DIAGNOSIS — Z951 Presence of aortocoronary bypass graft: Secondary | ICD-10-CM

## 2017-09-02 LAB — TYPE AND SCREEN
ABO/RH(D): B POS
ANTIBODY SCREEN: NEGATIVE
Unit division: 0
Unit division: 0

## 2017-09-02 LAB — GLUCOSE, CAPILLARY
GLUCOSE-CAPILLARY: 113 mg/dL — AB (ref 65–99)
Glucose-Capillary: 79 mg/dL (ref 65–99)
Glucose-Capillary: 63 mg/dL — ABNORMAL LOW (ref 65–99)
Glucose-Capillary: 72 mg/dL (ref 65–99)

## 2017-09-02 LAB — BASIC METABOLIC PANEL
Anion gap: 9 (ref 5–15)
BUN: 15 mg/dL (ref 6–20)
CO2: 23 mmol/L (ref 22–32)
Calcium: 8.4 mg/dL — ABNORMAL LOW (ref 8.9–10.3)
Chloride: 106 mmol/L (ref 101–111)
Creatinine, Ser: 0.99 mg/dL (ref 0.44–1.00)
GFR calc Af Amer: >60 mL/min (ref >60)
GLUCOSE: 77 mg/dL (ref 65–99)
POTASSIUM: 3.1 mmol/L — AB (ref 3.5–5.1)
Sodium: 138 mmol/L (ref 135–145)

## 2017-09-02 LAB — CBC
HEMATOCRIT: 25.6 % — AB (ref 36.0–46.0)
HEMOGLOBIN: 7.8 g/dL — AB (ref 12.0–15.0)
MCH: 23.1 pg — ABNORMAL LOW (ref 26.0–34.0)
MCHC: 30.5 g/dL (ref 30.0–36.0)
MCV: 75.7 fL — ABNORMAL LOW (ref 78.0–100.0)
Platelets: 246 10*3/uL (ref 150–400)
RBC: 3.38 MIL/uL — ABNORMAL LOW (ref 3.87–5.11)
RDW: 16.4 % — AB (ref 11.5–15.5)
WBC: 14.7 10*3/uL — AB (ref 4.0–10.5)

## 2017-09-02 LAB — BPAM RBC
BLOOD PRODUCT EXPIRATION DATE: 201903082359
Blood Product Expiration Date: 201903112359
ISSUE DATE / TIME: 201902131003
ISSUE DATE / TIME: 201902131003
UNIT TYPE AND RH: 7300
UNIT TYPE AND RH: 7300

## 2017-09-02 MED ORDER — FUROSEMIDE 80 MG PO TABS: 80.0000 mg | ORAL_TABLET | Freq: Every day | ORAL | Status: DC

## 2017-09-02 MED ORDER — POTASSIUM CHLORIDE CRYS ER 20 MEQ PO TBCR: 40.0000 meq | EXTENDED_RELEASE_TABLET | Freq: Once | ORAL | Status: DC

## 2017-09-02 MED ORDER — SODIUM CHLORIDE 0.9 % IV SOLN: 250.0000 mL | INTRAVENOUS | Status: DC | PRN

## 2017-09-02 MED ORDER — SORBITOL 70 % PO SOLN
30.0000 mL | Freq: Every morning | ORAL | Status: DC
  Administered 2017-09-02: 30 mL via ORAL
  Filled 2017-09-02 (×2): qty 30

## 2017-09-02 MED ORDER — POTASSIUM CHLORIDE CRYS ER 20 MEQ PO TBCR
40.0000 meq | EXTENDED_RELEASE_TABLET | Freq: Every day | ORAL | Status: DC
  Administered 2017-09-02: 40 meq via ORAL
  Filled 2017-09-02: qty 2

## 2017-09-02 MED ORDER — SODIUM CHLORIDE 0.9% FLUSH: 3.0000 mL | INTRAVENOUS | Status: DC | PRN

## 2017-09-02 MED ORDER — POTASSIUM CHLORIDE CRYS ER 20 MEQ PO TBCR
20.0000 meq | EXTENDED_RELEASE_TABLET | ORAL | Status: DC | PRN
  Administered 2017-09-02: 20 meq via ORAL
  Filled 2017-09-02: qty 1

## 2017-09-02 MED ORDER — CLONIDINE HCL 0.2 MG PO TABS
0.2000 mg | ORAL_TABLET | Freq: Two times a day (BID) | ORAL | Status: DC
  Administered 2017-09-02 – 2017-09-03 (×4): 0.2 mg via ORAL
  Filled 2017-09-02 (×4): qty 1

## 2017-09-02 MED ORDER — SODIUM CHLORIDE 0.9% FLUSH
3.0000 mL | Freq: Two times a day (BID) | INTRAVENOUS | Status: DC
  Administered 2017-09-02 – 2017-09-03 (×2): 3 mL via INTRAVENOUS
  Administered 2017-09-05: 11:00:00 via INTRAVENOUS

## 2017-09-02 MED ORDER — ALBUTEROL SULFATE (2.5 MG/3ML) 0.083% IN NEBU: 2.5000 mg | INHALATION_SOLUTION | RESPIRATORY_TRACT | Status: DC | PRN

## 2017-09-02 MED ORDER — MOVING RIGHT ALONG BOOK
Freq: Once | Status: AC
  Administered 2017-09-02: 13:00:00
  Filled 2017-09-02: qty 1

## 2017-09-02 MED ORDER — METOPROLOL TARTRATE 50 MG PO TABS
75.0000 mg | ORAL_TABLET | Freq: Two times a day (BID) | ORAL | Status: DC
  Administered 2017-09-02: 22:00:00 75 mg via ORAL
  Administered 2017-09-02: 25 mg via ORAL
  Administered 2017-09-03 – 2017-09-04 (×3): 75 mg via ORAL
  Filled 2017-09-02 (×5): qty 1

## 2017-09-02 NOTE — Progress Notes (Signed)
3 Days Post-Op Procedure(s) (LRB): CORONARY ARTERY BYPASS GRAFTING (CABG) x 5 WITH ENDOSCOPIC HARVESTING OF RIGHT SAPHENOUS VEIN (N/A) TRANSESOPHAGEAL ECHOCARDIOGRAM (TEE) (N/A) Subjective: Wa;lked unit BP still high nsr ready for stepdown  Objective: Vital signs in last 24 hours: Temp:  [97.5 F (36.4 C)-98.3 F (36.8 C)] 97.5 F (36.4 C) (02/16 0730) Pulse Rate:  [82-100] 100 (02/16 0900) Cardiac Rhythm: Normal sinus rhythm (02/16 0800) Resp:  [16-27] 20 (02/16 0900) BP: (138-189)/(82-105) 189/90 (02/16 0900) SpO2:  [93 %-100 %] 98 % (02/16 0900) Weight:  [211 lb 13.8 oz (96.1 kg)-218 lb 4.1 oz (99 kg)] 218 lb 4.1 oz (99 kg) (02/16 0500)  Hemodynamic parameters for last 24 hours:    Intake/Output from previous day: 02/15 0701 - 02/16 0700 In: 992.6 [P.O.:420; I.V.:372.6; IV Piggyback:200] Out: 2170 [Urine:2160; Chest Tube:10] Intake/Output this shift: Total I/O In: -  Out: 875 [Urine:875]       Exam    General- alert and comfortable    Neck- no JVD, no cervical adenopathy palpable, no carotid bruit   Lungs- clear without rales, wheezes   Cor- regular rate and rhythm, no murmur , gallop   Abdomen- soft, non-tender   Extremities - warm, non-tender, minimal edema   Neuro- oriented, appropriate, no focal weakness   Lab Results: Recent Labs    09/01/17 0430 09/02/17 0258  WBC 15.1* 14.7*  HGB 7.1* 7.8*  HCT 22.7* 25.6*  PLT 223 246   BMET:  Recent Labs    09/01/17 0430 09/02/17 0258  NA 135 138  K 3.7 3.1*  CL 104 106  CO2 22 23  GLUCOSE 165* 77  BUN 16 15  CREATININE 1.26* 0.99  CALCIUM 8.1* 8.4*    PT/INR:  Recent Labs    08/30/17 1519  LABPROT 16.3*  INR 1.32   ABG    Component Value Date/Time   PHART 7.439 08/31/2017 0930   HCO3 20.4 08/31/2017 0930   TCO2 21 (L) 08/31/2017 1554   ACIDBASEDEF 3.0 (H) 08/31/2017 0930   O2SAT 95.0 08/31/2017 0930   CBG (last 3)  Recent Labs    09/01/17 1659 09/01/17 2330 09/02/17 0817  GLUCAP  128* 100* 79    Assessment/Plan: S/P Procedure(s) (LRB): CORONARY ARTERY BYPASS GRAFTING (CABG) x 5 WITH ENDOSCOPIC HARVESTING OF RIGHT SAPHENOUS VEIN (N/A) TRANSESOPHAGEAL ECHOCARDIOGRAM (TEE) (N/A) Mobilize Diuresis Diabetes control Plan for transfer to step-down: see transfer orders BP control- titrate meds   LOS: 6 days    Kathlee Nationseter Van Trigt III 09/02/2017

## 2017-09-02 NOTE — Progress Notes (Signed)
CT surgery p.m. Rounds  Patient had excellent day Waiting for bed on 4 E. Continue current care

## 2017-09-03 ENCOUNTER — Inpatient Hospital Stay (HOSPITAL_COMMUNITY): Payer: 59

## 2017-09-03 LAB — BASIC METABOLIC PANEL
Anion gap: 12 (ref 5–15)
BUN: 16 mg/dL (ref 6–20)
CO2: 21 mmol/L — ABNORMAL LOW (ref 22–32)
Calcium: 8.8 mg/dL — ABNORMAL LOW (ref 8.9–10.3)
Chloride: 106 mmol/L (ref 101–111)
Creatinine, Ser: 1.04 mg/dL — ABNORMAL HIGH (ref 0.44–1.00)
GFR calc Af Amer: >60 mL/min (ref >60)
GFR calc non Af Amer: >60 mL/min (ref >60)
Glucose, Bld: 72 mg/dL (ref 65–99)
Potassium: 3 mmol/L — ABNORMAL LOW (ref 3.5–5.1)
Sodium: 139 mmol/L (ref 135–145)

## 2017-09-03 LAB — CBC
HCT: 26.5 % — ABNORMAL LOW (ref 36.0–46.0)
Hemoglobin: 8.5 g/dL — ABNORMAL LOW (ref 12.0–15.0)
MCH: 24.4 pg — ABNORMAL LOW (ref 26.0–34.0)
MCHC: 32.1 g/dL (ref 30.0–36.0)
MCV: 76.1 fL — ABNORMAL LOW (ref 78.0–100.0)
Platelets: 291 10*3/uL (ref 150–400)
RBC: 3.48 MIL/uL — ABNORMAL LOW (ref 3.87–5.11)
RDW: 17 % — ABNORMAL HIGH (ref 11.5–15.5)
WBC: 10.8 10*3/uL — ABNORMAL HIGH (ref 4.0–10.5)

## 2017-09-03 LAB — GLUCOSE, CAPILLARY
GLUCOSE-CAPILLARY: 71 mg/dL (ref 65–99)
Glucose-Capillary: 86 mg/dL (ref 65–99)
Glucose-Capillary: 97 mg/dL (ref 65–99)
Glucose-Capillary: 141 mg/dL — ABNORMAL HIGH (ref 65–99)

## 2017-09-03 LAB — POTASSIUM: Potassium: 3.6 mmol/L (ref 3.5–5.1)

## 2017-09-03 MED ORDER — POTASSIUM CHLORIDE CRYS ER 20 MEQ PO TBCR
40.0000 meq | EXTENDED_RELEASE_TABLET | Freq: Two times a day (BID) | ORAL | Status: DC
  Administered 2017-09-03 – 2017-09-05 (×5): 40 meq via ORAL
  Filled 2017-09-03 (×5): qty 2

## 2017-09-03 MED ORDER — FUROSEMIDE 40 MG PO TABS
40.0000 mg | ORAL_TABLET | Freq: Every day | ORAL | Status: DC
  Administered 2017-09-03 – 2017-09-05 (×3): 40 mg via ORAL
  Filled 2017-09-03 (×3): qty 1

## 2017-09-03 MED ORDER — POTASSIUM CHLORIDE CRYS ER 20 MEQ PO TBCR
40.0000 meq | EXTENDED_RELEASE_TABLET | Freq: Once | ORAL | Status: AC
  Administered 2017-09-03: 40 meq via ORAL
  Filled 2017-09-03: qty 2

## 2017-09-03 NOTE — Progress Notes (Signed)
4 Days Post-Op Procedure(s) (LRB): CORONARY ARTERY BYPASS GRAFTING (CABG) x 5 WITH ENDOSCOPIC HARVESTING OF RIGHT SAPHENOUS VEIN (N/A) TRANSESOPHAGEAL ECHOCARDIOGRAM (TEE) (N/A) Subjective: Looks great BP - now on metoprolol, losartan,norvasc,clonidine To tele bed, DC EPWs  Prepare for DC home tomorrow Will need HHN to check BP, fluid status Objective: Vital signs in last 24 hours: Temp:  [98 F (36.7 C)-98.7 F (37.1 C)] 98.1 F (36.7 C) (02/17 0752) Pulse Rate:  [81-114] 87 (02/17 0752) Cardiac Rhythm: Sinus tachycardia (02/16 2000) Resp:  [13-23] 22 (02/17 0752) BP: (125-188)/(79-99) 167/84 (02/17 0752) SpO2:  [91 %-97 %] 97 % (02/17 0752) Weight:  [212 lb 3.2 oz (96.3 kg)] 212 lb 3.2 oz (96.3 kg) (02/17 0500)  Hemodynamic parameters for last 24 hours:    Intake/Output from previous day: 02/16 0701 - 02/17 0700 In: 720 [P.O.:720] Out: 1976 [Urine:1975; Stool:1] Intake/Output this shift: No intake/output data recorded.  Incisions clean  CXR mild atelectasis  Lab Results: Recent Labs    09/02/17 0258 09/03/17 0329  WBC 14.7* 10.8*  HGB 7.8* 8.5*  HCT 25.6* 26.5*  PLT 246 291   BMET:  Recent Labs    09/02/17 0258 09/03/17 0329  NA 138 139  K 3.1* 3.0*  CL 106 106  CO2 23 21*  GLUCOSE 77 72  BUN 15 16  CREATININE 0.99 1.04*  CALCIUM 8.4* 8.8*    PT/INR: No results for input(s): LABPROT, INR in the last 72 hours. ABG    Component Value Date/Time   PHART 7.439 08/31/2017 0930   HCO3 20.4 08/31/2017 0930   TCO2 21 (L) 08/31/2017 1554   ACIDBASEDEF 3.0 (H) 08/31/2017 0930   O2SAT 95.0 08/31/2017 0930   CBG (last 3)  Recent Labs    09/02/17 1651 09/02/17 2234 09/03/17 0756  GLUCAP 63* 72 86    Assessment/Plan: S/P Procedure(s) (LRB): CORONARY ARTERY BYPASS GRAFTING (CABG) x 5 WITH ENDOSCOPIC HARVESTING OF RIGHT SAPHENOUS VEIN (N/A) TRANSESOPHAGEAL ECHOCARDIOGRAM (TEE) (N/A) tx tele bed prob home in am   LOS: 7 days    Michele Nationseter Van Trigt  [name] 09/03/2017

## 2017-09-03 NOTE — Progress Notes (Signed)
Pt had 6 beat run of VT witnessed by RN. Pt asymptomatic resting in bed. Will continue to monitor.  Versie StarksHanna  Lakera Viall, RN

## 2017-09-04 DIAGNOSIS — I10 Essential (primary) hypertension: Secondary | ICD-10-CM

## 2017-09-04 LAB — GLUCOSE, CAPILLARY
GLUCOSE-CAPILLARY: 120 mg/dL — AB (ref 65–99)
GLUCOSE-CAPILLARY: 108 mg/dL — AB (ref 65–99)
GLUCOSE-CAPILLARY: 69 mg/dL (ref 65–99)
Glucose-Capillary: 78 mg/dL (ref 65–99)

## 2017-09-04 LAB — BASIC METABOLIC PANEL
Anion gap: 9 (ref 5–15)
BUN: 16 mg/dL (ref 6–20)
CO2: 22 mmol/L (ref 22–32)
Calcium: 8.8 mg/dL — ABNORMAL LOW (ref 8.9–10.3)
Chloride: 108 mmol/L (ref 101–111)
Creatinine, Ser: 1.11 mg/dL — ABNORMAL HIGH (ref 0.44–1.00)
GFR calc Af Amer: >60 mL/min (ref >60)
GFR calc non Af Amer: 59 mL/min — ABNORMAL LOW (ref >60)
Glucose, Bld: 86 mg/dL (ref 65–99)
Potassium: 3.9 mmol/L (ref 3.5–5.1)
Sodium: 139 mmol/L (ref 135–145)

## 2017-09-04 MED ORDER — METOPROLOL TARTRATE 75 MG PO TABS: 75.0000 mg | ORAL_TABLET | Freq: Two times a day (BID) | ORAL | 1 refills | Status: DC

## 2017-09-04 MED ORDER — FUROSEMIDE 40 MG PO TABS: 40.0000 mg | ORAL_TABLET | Freq: Every day | ORAL | 1 refills | Status: DC

## 2017-09-04 MED ORDER — HYDRALAZINE HCL 25 MG PO TABS
25.0000 mg | ORAL_TABLET | Freq: Three times a day (TID) | ORAL | Status: DC
  Administered 2017-09-04 – 2017-09-05 (×2): 25 mg via ORAL
  Filled 2017-09-04 (×3): qty 1

## 2017-09-04 MED ORDER — LOSARTAN POTASSIUM 100 MG PO TABS: 100.0000 mg | ORAL_TABLET | Freq: Every day | ORAL | 1 refills | Status: DC

## 2017-09-04 MED ORDER — ATORVASTATIN CALCIUM 80 MG PO TABS: 80.0000 mg | ORAL_TABLET | Freq: Every day | ORAL | 1 refills | Status: AC

## 2017-09-04 MED ORDER — POTASSIUM CHLORIDE CRYS ER 20 MEQ PO TBCR
20.0000 meq | EXTENDED_RELEASE_TABLET | Freq: Once | ORAL | Status: AC
  Administered 2017-09-04: 20 meq via ORAL
  Filled 2017-09-04: qty 1

## 2017-09-04 MED ORDER — LEVETIRACETAM 100 MG/ML PO SOLN
750.0000 mg | Freq: Two times a day (BID) | ORAL | Status: DC
  Administered 2017-09-04 – 2017-09-05 (×3): 750 mg via ORAL
  Filled 2017-09-04 (×2): qty 7.5

## 2017-09-04 MED ORDER — OXYCODONE HCL 5 MG PO TABS: ORAL_TABLET | ORAL | 0 refills | Status: DC

## 2017-09-04 MED ORDER — FUROSEMIDE 10 MG/ML IJ SOLN
40.0000 mg | Freq: Once | INTRAMUSCULAR | Status: AC
  Administered 2017-09-04: 40 mg via INTRAVENOUS
  Filled 2017-09-04: qty 4

## 2017-09-04 MED ORDER — METOPROLOL TARTRATE 100 MG PO TABS
100.0000 mg | ORAL_TABLET | Freq: Two times a day (BID) | ORAL | Status: DC
  Administered 2017-09-04 – 2017-09-05 (×2): 100 mg via ORAL
  Filled 2017-09-04 (×2): qty 1

## 2017-09-04 MED ORDER — POTASSIUM CHLORIDE CRYS ER 20 MEQ PO TBCR: 20.0000 meq | EXTENDED_RELEASE_TABLET | Freq: Every day | ORAL | 1 refills | Status: DC

## 2017-09-04 MED ORDER — METOPROLOL TARTRATE 25 MG PO TABS
25.0000 mg | ORAL_TABLET | Freq: Once | ORAL | Status: AC
Start: 2017-09-04 — End: 2017-09-04
  Administered 2017-09-04: 25 mg via ORAL
  Filled 2017-09-04: qty 1

## 2017-09-04 MED ORDER — INSULIN DETEMIR 100 UNIT/ML ~~LOC~~ SOLN
15.0000 [IU] | Freq: Two times a day (BID) | SUBCUTANEOUS | Status: DC
  Administered 2017-09-05: 15 [IU] via SUBCUTANEOUS
  Filled 2017-09-04 (×2): qty 0.15

## 2017-09-04 MED ORDER — GLIPIZIDE ER 10 MG PO TB24: 20.0000 mg | ORAL_TABLET | Freq: Every day | ORAL | 1 refills | Status: DC

## 2017-09-04 MED ORDER — CLONIDINE HCL 0.3 MG PO TABS: 0.3000 mg | ORAL_TABLET | Freq: Two times a day (BID) | ORAL | 1 refills | Status: DC

## 2017-09-04 MED ORDER — CLONIDINE HCL 0.2 MG PO TABS
0.3000 mg | ORAL_TABLET | Freq: Two times a day (BID) | ORAL | Status: DC
  Administered 2017-09-04 – 2017-09-05 (×3): 0.3 mg via ORAL
  Filled 2017-09-04 (×4): qty 1

## 2017-09-04 NOTE — Progress Notes (Signed)
Inpatient Diabetes Program Recommendations  AACE/ADA: New Consensus Statement on Inpatient Glycemic Control (2015)  Target Ranges:  Prepandial:   less than 140 mg/dL      Peak postprandial:   less than 180 mg/dL (1-2 hours)      Critically ill patients:  140 - 180 mg/dL   Lab Results  Component Value Date   GLUCAP 120 (H) 09/04/2017   HGBA1C 10.6 (H) 08/29/2017    Diabetes history: Type 2 DM Outpatient Diabetes medications: Metformin 1,000 mg in AM, Glipizide 20 mg QD Current orders for Inpatient glycemic control: Novolog 0-5 Units QHS, Glipizide 20 units QD, Novolog 0-15 Units TID, Levemir 15 Units BID   Reviewed patient's current A1c of 10.6%. Explained what a A1c is and what it measures. Also reviewed goal A1c with patient, importance of good glucose control @ home, and blood sugar goals. Confirmed patient had difficulty paying for medications prior to hospitalization and stated, "I just wasn't taking good care of myself; Now I know what I gotta do." Patient expressed understanding of the importance of taking medications.  We discussed the importance of diet and becoming more active in the months ahead, as this would also help her A1C. Placed an order for outpatient education. Patient has a meter and was checking blood sugars, but admitted to not as often as she should have been. Encouraged patient to start checking 2x a day.   If planning to be DC'd home on insulin, patient would qualify for Lantus at 0$ copay using a copay card. Can provide this, as well as additional education in the AM.   Thanks, Lujean RaveLauren Kyce Ging, MSN, RNC-OB Diabetes Coordinator [phone] (8a-5p)

## 2017-09-04 NOTE — Progress Notes (Signed)
CARDIAC REHAB PHASE I   PRE:  Rate/Rhythm: 95 SR    BP: sitting 199/99 (before meds)    SaO2: 98 RA  MODE:  Ambulation: 400 ft   POST:  Rate/Rhythm: 112 ST    BP: sitting 165/78     SaO2: 98 RA   Pt just waking up and getting BP meds. We discussed education first then ambulated. Pt sleepy and fatigues with walking. Steady with RW, rest intermittently due to SOB. VSS. Long discussion or prioritizing her health. She has a new PCP that she hasn't followed up with. Encouraged her to make an appointment with him soon. She is planning to quit smoking cigarettes and marijuana but has questions regarding edible marijuana to help with stress (her daughter has special needs). [facility].  [phone]  Michele MassonRandi Kristan Jacee [name] CES, ACSM 09/04/2017 10:09 AM

## 2017-09-04 NOTE — Progress Notes (Signed)
Nutrition Consult/Brief Note  RD consulted for nutrition education regarding diabetes.   Lab Results  Component Value Date   HGBA1C 10.6 (H) 08/29/2017   RD provided "Carbohydrate Counting for People with Diabetes" handout from the Academy of Nutrition and Dietetics. Pt opted to review education material on her own time. Left on tray table.  Body mass index is 41.64 kg/m. Pt meets criteria for Obesity Class III based on current BMI.  Pt is being discharged today.  Maureen ChattersKatie Alecxander Mainwaring, RD, LDN Pager #: [phone] After-Hours Pager #: [phone]

## 2017-09-04 NOTE — Care Management Note (Signed)
Case Management Note Donn PieriniKristi Pinki Rottman RN, BSN Unit 4E-Case Manager [phone]  Patient Details  Name: Michele [name] MRN: [id number] Date of Birth: Jun 07, 1971  Subjective/Objective:  [place] with [place], s/p cath with 3VD,  Awaiting CVTS consult                  Action/Plan: PTA Pt lived at home, independent has insurance and medication coverage- received notice pt had questions about advanced directives and medications- spoke with pt at bedside- packet provided about advanced directives- pt to review and let bedside RN know when she is ready for Chaplain to come f/u with her for Advance Directives. Also discussed medications pt states that she uses [facility]- most are $4 but she reports that she has few that are higher copays that she sometimes has difficulty with - especially her DM supplies. Discussed other options for DM supplies and to f/u with her pharmacist. CM will follow for further transition needs.   Expected Discharge Date:                  Expected Discharge Plan:  Home w Home Health Services  In-House Referral:  Chaplain  Discharge planning Services  CM Consult  Post Acute Care Choice:  Home Health, Durable Medical Equipment Choice offered to:  Patient  DME Arranged:  [place] rolling DME Agency:  [facility]  HH Arranged[facility]  [facility]  [facility]  Status of Service:  Completed, signed off  If discussed at Long Length of Stay Meetings, dates discussed:    Discharge Disposition: home/home health   Additional Comments:  09/04/17- 1030- Arnold Depinto RN, CM- pt for d/c later today- referral for DME and Hh needs- pt s/p CABGx5- spoke with pt at bedside- [facility]- per pt [facility] and [facility] needs- RW to be delivered to room prior to discharge- [facility] will [facility] pt within 48 hr post discharge. Confirmed address and phone # in epic with pt.   Michele [name],  Michele Wissmann Hall, RN 09/04/2017, 10:32 AM

## 2017-09-04 NOTE — Progress Notes (Addendum)
      [address]       [place], [id number]             [phone]        5 Days Post-Op Procedure(s) (LRB): CORONARY ARTERY BYPASS GRAFTING (CABG) x 5 WITH ENDOSCOPIC HARVESTING OF RIGHT SAPHENOUS VEIN (N/A) TRANSESOPHAGEAL ECHOCARDIOGRAM (TEE) (N/A)  Subjective: Patient sleeping-awakened.   Objective: Vital signs in last 24 hours: Temp:  [97.8 F (36.6 C)-98.3 F (36.8 C)] 97.8 F (36.6 C) (02/18 0548) Pulse Rate:  [77-96] 84 (02/18 0548) Cardiac Rhythm: Normal sinus rhythm (02/18 0540) Resp:  [14-26] 21 (02/18 0548) BP: (134-180)/(66-93) 160/80 (02/18 0548) SpO2:  [95 %-98 %] 97 % (02/18 0548) Weight:  [213 lb 3.2 oz (96.7 kg)] 213 lb 3.2 oz (96.7 kg) (02/18 0548)  Pre op weight 92.7 kg Current Weight  09/04/17 213 lb 3.2 oz (96.7 kg)      Intake/Output from previous day: 02/17 0701 - 02/18 0700 In: 480 [P.O.:480] Out: 1100 [Urine:1100]   Physical Exam:  Cardiovascular: RRR Pulmonary: Slightly diminished at bases Abdomen: Soft, non tender, bowel sounds present. Extremities: Mild bilateral lower extremity edema. Wounds: Clean and dry.  No erythema or signs of infection.  Lab Results: CBC: Recent Labs    09/02/17 0258 09/03/17 0329  WBC 14.7* 10.8*  HGB 7.8* 8.5*  HCT 25.6* 26.5*  PLT 246 291   BMET:  Recent Labs    09/03/17 0329 09/03/17 1558 09/04/17 0317  NA 139  --  139  K 3.0* 3.6 3.9  CL 106  --  108  CO2 21*  --  22  GLUCOSE 72  --  86  BUN 16  --  16  CREATININE 1.04*  --  1.11*  CALCIUM 8.8*  --  8.8*    PT/INR:  Lab Results  Component Value Date   INR 1.32 08/30/2017   INR 1.02 08/29/2017   INR 1.03 08/28/2017   ABG:  INR: Will add last result for INR, ABG once components are confirmed Will add last 4 CBG results once components are confirmed  Assessment/Plan:  1. CV - S/p NSTEMI. Hypertensive. On Amlodipine 10 mg daily, Clonidine 0.2 mg bid, Losartan 100 mg daily, Lopressor 75 mg bid. Will increase  Clonidine for better BP control. Will ask cardiology to evaluate and she will need close follow up after discharge. 2.  Pulmonary - On room air. Encourage incentive spirometer. 3. Volume Overload - On Lasix 40 mg daily 4.  Acute blood loss anemia - H and H stable yesterday 8.5 and 26.5 5. Supplement potassium 6. DM-CBGs 141/71/78. On Glipizide Xl 20 mg daily, Insulin. She was on Metformin 1000 mg bid prior to admission. Will restart at discharge and stop scheduled Insulin. Pre op HGA1C 10.6. Will need close medical follow up after discharge. 7. Possibly discharge later today (if BP better controlled) with HH vs am  Donielle M ZimmermanPA-C 09/04/2017,7:16 AM

## 2017-09-04 NOTE — Progress Notes (Signed)
Patient is still hypertensive despite increase in Catapres. As recommended by cardiology, will stop Norvasc and start Hydralazine. If BP better controlled in the am, will discharge.

## 2017-09-04 NOTE — Progress Notes (Signed)
Progress Note  Patient Name: Michele [name] Date of Encounter: 09/04/2017  Primary Cardiologist: Olga Millers, MD   Subjective   + cough, productive thin white mucus, interferes with her eating.  Resp elevated   Inpatient Medications    Scheduled Meds: . acetaminophen  1,000 mg Oral Q6H   Or  . acetaminophen (TYLENOL) oral liquid 160 mg/5 mL  1,000 mg Per Tube Q6H  . amLODipine  10 mg Oral Daily  . aspirin EC  325 mg Oral Daily   Or  . aspirin  324 mg Per Tube Daily  . atorvastatin  80 mg Oral q1800  . bisacodyl  10 mg Oral Daily   Or  . bisacodyl  10 mg Rectal Daily  . cloNIDine  0.3 mg Oral BID  . docusate sodium  200 mg Oral Daily  . enoxaparin (LOVENOX) injection  40 mg Subcutaneous QHS  . furosemide  40 mg Oral Daily  . glipiZIDE  20 mg Oral Daily  . insulin aspart  0-15 Units Subcutaneous TID WC  . insulin aspart  0-5 Units Subcutaneous QHS  . insulin detemir  15 Units Subcutaneous BID  . levETIRAcetam  750 mg Oral BID  . losartan  100 mg Oral Daily  . metoprolol tartrate  75 mg Oral BID  . pantoprazole  40 mg Oral Daily  . potassium chloride  40 mEq Oral BID  . sodium chloride flush  3 mL Intravenous Q12H  . sodium chloride flush  3 mL Intravenous Q12H   Continuous Infusions: . sodium chloride    . sodium chloride     PRN Meds: sodium chloride, albuterol, hydrALAZINE, ondansetron (ZOFRAN) IV, oxyCODONE, sodium chloride flush, sodium chloride flush, traMADol   Vital Signs    Vitals:   09/03/17 1506 09/03/17 2038 09/04/17 0548 09/04/17 1245  BP: (!) 155/83 (!) 180/85 (!) 160/80 (!) 179/94  Pulse: 87 91 84 96  Resp: 20 14 (!) 21   Temp: 97.8 F (36.6 C) 98 F (36.7 C) 97.8 F (36.6 C)   TempSrc: Oral Oral Oral   SpO2: 96% 98% 97%   Weight:   213 lb 3.2 oz (96.7 kg)   Height:        Intake/Output Summary (Last 24 hours) at 09/04/2017 1333 Last data filed at 09/04/2017 0636 Gross per 24 hour  Intake -  Output 800 ml  Net -800 ml   Filed  Weights   09/02/17 0500 09/03/17 0500 09/04/17 0548  Weight: 218 lb 4.1 oz (99 kg) 212 lb 3.2 oz (96.3 kg) 213 lb 3.2 oz (96.7 kg)    Telemetry    SR - Personally Reviewed  ECG    No new - Personally Reviewed  Physical Exam   GEN: No acute distress.   Neck: No JVD Cardiac: RRR, no murmurs, rubs, or gallops.  Respiratory: diminished in bases to auscultation bilaterally. No wheezes, + prod. Cough  GI: Soft, nontender, non-distended  MS: No to tr edema; No deformity. Neuro:  Nonfocal  Psych: Normal affect   Labs    Chemistry Recent Labs  Lab 09/02/17 0258 09/03/17 0329 09/03/17 1558 09/04/17 0317  NA 138 139  --  139  K 3.1* 3.0* 3.6 3.9  CL 106 106  --  108  CO2 23 21*  --  22  GLUCOSE 77 72  --  86  BUN 15 16  --  16  CREATININE 0.99 1.04*  --  1.11*  CALCIUM 8.4* 8.8*  --  8.8*  GFRNONAA >60 >60  --  59*  GFRAA >60 >60  --  >60  ANIONGAP 9 12  --  9     Hematology Recent Labs  Lab 09/01/17 0430 09/02/17 0258 09/03/17 0329  WBC 15.1* 14.7* 10.8*  RBC 3.00* 3.38* 3.48*  HGB 7.1* 7.8* 8.5*  HCT 22.7* 25.6* 26.5*  MCV 75.7* 75.7* 76.1*  MCH 23.7* 23.1* 24.4*  MCHC 31.3 30.5 32.1  RDW 16.5* 16.4* 17.0*  PLT 223 246 291    Cardiac EnzymesNo results for input(s): TROPONINI in the last 168 hours. No results for input(s): TROPIPOC in the last 168 hours.   BNPNo results for input(s): BNP, PROBNP in the last 168 hours.   DDimer No results for input(s): DDIMER in the last 168 hours.   Radiology    Dg Chest 2 View  Result Date: 09/03/2017 CLINICAL DATA:  Status post CABG EXAM: CHEST  2 VIEW COMPARISON:  09/02/2017 FINDINGS: The cardio pericardial silhouette is enlarged. Bibasilar atelectasis with small to moderate left pleural effusion and small right pleural effusion. No overt pulmonary edema. The visualized bony structures of the thorax are intact. IMPRESSION: Cardiomegaly with bibasilar atelectasis and small moderate bilateral pleural effusions  Electronically Signed   By: Kennith CenterEric  Mansell M.D.   On: 09/03/2017 08:34    Cardiac Studies   Echo 08/28/17 Study Conclusions  - Left ventricle: The cavity size was normal. Wall thickness was   increased in a pattern of moderate LVH. Systolic function was   moderately reduced. The estimated ejection fraction was in the   range of 35% to 40%. Diffuse hypokinesis. Features are consistent   with a pseudonormal left ventricular filling pattern, with   concomitant abnormal relaxation and increased filling pressure   (grade 2 diastolic dysfunction). - Aortic valve: There was no stenosis. - Mitral valve: There was trivial regurgitation. - Left atrium: The atrium was mildly dilated. - Right ventricle: The cavity size was normal. Systolic function   was normal. - Pulmonary arteries: PA peak pressure: 36 mm Hg (S). - Systemic veins: IVC measured 2.5 cm with < 50% respirophasic   variation, suggesting RA pressure 15 mmHg.  Impressions:  - Normal LV size with moderate LV hypertrophy. EF 35-40%, diffuse   hypokinesis. Moderate diastolic dysfunction. Normal RV size and   systolic function. Dilated IVC suggesting elevated RV filling   pressure. Borderline pulmonary hypertension.   Patient Profile     47 y.o. female admitted 08/27/17 with NSTEMI, hx CVA, Seizures, HTN, DM, and anemia, 3 vessel disease on cath and CABG 08/30/17.  Now with HTN.   Assessment & Plan    HTN  BP 160/80 ; 179/94  (amlodipine 10 mg , catapres 0.3 mg daily,  Lasix 40 mg daily, cozaar 100 mg  Daily, lopressor 75 mg BID)   Increase BB to 100 bid  ? IV lasix her resp. Have increased and increased cough, Dr. Eden EmmsNishan to see.   CABG POD #5,     LIMA-> LAD, VG->1st diag, VG-> OM, VG, seq-> PDA and PLA, plan for discharge if BP controlled.  Cardiomyopathy ischemic EF 35-40% by Echo 08/28/17  NSTEMI troponin 1.83  HLD  On lipitor 80     For questions or updates, please contact [facility] Please consult [email or web address]  for contact info under Cardiology/STEMI.      Signed, Nada BoozerLaura Ingold, NP  09/04/2017, 1:33 PM    Patient examined chart reviewed. Poor medical f/u prior to admission with no medication for HTN/DM. Post CABG  With EF 35-40% CXR yesterday ok but has some rhonchi on exam. Would continue current Rx including higher Dose catapres. Needs outpatient f/u with Dr Jens Som in [facility] office If BP up at that time would stop norvasc and start hydralazine. Sternum is well healed No arrhythmias Ok to go home today form our stand point  [employer]

## 2017-09-04 NOTE — Progress Notes (Signed)
Inpatient Diabetes Program Recommendations  AACE/ADA: New Consensus Statement on Inpatient Glycemic Control (2015)  Target Ranges:  Prepandial:   less than 140 mg/dL      Peak postprandial:   less than 180 mg/dL (1-2 hours)      Critically ill patients:  140 - 180 mg/dL   Lab Results  Component Value Date   GLUCAP 78 09/04/2017   HGBA1C 10.6 (H) 08/29/2017    Review of Glycemic Control Results for Michele [name], Michele [name] (MRN [id number]) as of 09/04/2017 10:52  Ref. Range 09/03/2017 07:56 09/03/2017 12:14 09/03/2017 17:02 09/03/2017 20:39 09/04/2017 06:02  Glucose-Capillary Latest Ref Range: 65 - 99 mg/dL 86 97 [id number] (H) 71 78   Diabetes history: Type 2 DM Outpatient Diabetes medications: Metformin 1,000 mg in AM, Glipizide 20 mg QD Current orders for Inpatient glycemic control: Novolog 0-5 Units QHS, Glipizide 20 units QD, Novolog 0-15 Units TID, Levemir 20 Units BID  Inpatient Diabetes Program Recommendations:    Noted that patient will likely discharge today, pending BP control. Appears that plan is to discontinue scheduled insulin and restart oral meds (Metformin and Glipizide). Patient will need close BS monitoring post DC.    Would consider holding Levemir 20 Units BID today, to see how blood sugars correlate while still inpatient. FSBS was 78 mg/dL. Will plan to see patient prior to discharge.   Thanks, Lujean RaveLauren Loana Salvaggio, MSN, RNC-OB Diabetes Coordinator [phone] (8a-5p)

## 2017-09-05 LAB — GLUCOSE, CAPILLARY
GLUCOSE-CAPILLARY: 146 mg/dL — AB (ref 65–99)
Glucose-Capillary: 213 mg/dL — ABNORMAL HIGH (ref 65–99)

## 2017-09-05 MED ORDER — GLIPIZIDE ER 10 MG PO TB24: 20.0000 mg | ORAL_TABLET | Freq: Every day | ORAL | 1 refills | Status: DC

## 2017-09-05 MED ORDER — POTASSIUM CHLORIDE CRYS ER 20 MEQ PO TBCR: 40.0000 meq | EXTENDED_RELEASE_TABLET | Freq: Every day | ORAL | 1 refills | Status: DC

## 2017-09-05 MED ORDER — METOPROLOL TARTRATE 100 MG PO TABS: 100.0000 mg | ORAL_TABLET | Freq: Two times a day (BID) | ORAL | 1 refills | Status: DC

## 2017-09-05 MED ORDER — OXYCODONE HCL 5 MG PO TABS: ORAL_TABLET | ORAL | 0 refills | Status: DC

## 2017-09-05 MED ORDER — LOSARTAN POTASSIUM 50 MG PO TABS
100.0000 mg | ORAL_TABLET | Freq: Every day | ORAL | Status: DC
Start: 2017-09-05 — End: 2017-09-05
  Administered 2017-09-05: 100 mg via ORAL
  Filled 2017-09-05: qty 2

## 2017-09-05 MED ORDER — BLOOD GLUCOSE MONITOR KIT: PACK | 0 refills | Status: AC

## 2017-09-05 MED ORDER — HYDRALAZINE HCL 50 MG PO TABS: 50.0000 mg | ORAL_TABLET | Freq: Three times a day (TID) | ORAL | Status: DC

## 2017-09-05 MED ORDER — LOSARTAN POTASSIUM 100 MG PO TABS: 100.0000 mg | ORAL_TABLET | Freq: Every day | ORAL | 1 refills | Status: DC

## 2017-09-05 MED ORDER — FUROSEMIDE 40 MG PO TABS: 40.0000 mg | ORAL_TABLET | Freq: Every day | ORAL | 0 refills | Status: DC

## 2017-09-05 MED ORDER — HYDRALAZINE HCL 50 MG PO TABS: 50.0000 mg | ORAL_TABLET | Freq: Three times a day (TID) | ORAL | 1 refills | Status: DC

## 2017-09-05 MED ORDER — POTASSIUM CHLORIDE CRYS ER 15 MEQ PO TBCR: 30.0000 meq | EXTENDED_RELEASE_TABLET | Freq: Every day | ORAL | 0 refills | Status: DC

## 2017-09-05 NOTE — Progress Notes (Signed)
Progress Note  Patient Name: Michele [name] Date of Encounter: 09/05/2017  Primary Cardiologist: Olga MillersBrian Crenshaw, MD   Subjective   Wants to go home   Inpatient Medications    Scheduled Meds: . aspirin EC  325 mg Oral Daily   Or  . aspirin  324 mg Per Tube Daily  . atorvastatin  80 mg Oral q1800  . bisacodyl  10 mg Oral Daily   Or  . bisacodyl  10 mg Rectal Daily  . cloNIDine  0.3 mg Oral BID  . docusate sodium  200 mg Oral Daily  . enoxaparin (LOVENOX) injection  40 mg Subcutaneous QHS  . furosemide  40 mg Oral Daily  . glipiZIDE  20 mg Oral Daily  . hydrALAZINE  50 mg Oral Q8H  . insulin aspart  0-15 Units Subcutaneous TID WC  . insulin aspart  0-5 Units Subcutaneous QHS  . insulin detemir  15 Units Subcutaneous BID  . levETIRAcetam  750 mg Oral BID  . losartan  100 mg Oral Daily  . metoprolol tartrate  100 mg Oral BID  . pantoprazole  40 mg Oral Daily  . potassium chloride  40 mEq Oral BID  . sodium chloride flush  3 mL Intravenous Q12H  . sodium chloride flush  3 mL Intravenous Q12H   Continuous Infusions: . sodium chloride    . sodium chloride     PRN Meds: sodium chloride, albuterol, hydrALAZINE, ondansetron (ZOFRAN) IV, oxyCODONE, sodium chloride flush, sodium chloride flush, traMADol   Vital Signs    Vitals:   09/04/17 2127 09/04/17 2350 09/05/17 0144 09/05/17 0609  BP: (!) 165/106 131/83  (!) 162/109  Pulse: 92   93  Resp: (!) 26   (!) 26  Temp:    98.3 F (36.8 C)  TempSrc:    Oral  SpO2:      Weight:   211 lb 12.8 oz (96.1 kg)   Height:        Intake/Output Summary (Last 24 hours) at 09/05/2017 0825 Last data filed at 09/04/2017 2300 Gross per 24 hour  Intake 240 ml  Output -  Net 240 ml   Filed Weights   09/03/17 0500 09/04/17 0548 09/05/17 0144  Weight: 212 lb 3.2 oz (96.3 kg) 213 lb 3.2 oz (96.7 kg) 211 lb 12.8 oz (96.1 kg)    Telemetry    SR - Personally Reviewed  ECG    No new - Personally Reviewed  Physical Exam    Affect appropriate Obese black female  HEENT: normal Neck supple with no adenopathy JVP normal no bruits no thyromegaly Lungs clear with no wheezing and good diaphragmatic motion Heart:  S1/S2 no murmur, no rub, gallop or click PMI normal post sternotomy  Abdomen: benighn, BS positve, no tenderness, no AAA no bruit.  No HSM or HJR Distal pulses intact with no bruits No edema Neuro non-focal Skin warm and dry No muscular weakness   Labs    Chemistry Recent Labs  Lab 09/02/17 0258 09/03/17 0329 09/03/17 1558 09/04/17 0317  NA 138 139  --  139  K 3.1* 3.0* 3.6 3.9  CL 106 106  --  108  CO2 23 21*  --  22  GLUCOSE 77 72  --  86  BUN 15 16  --  16  CREATININE 0.99 1.04*  --  1.11*  CALCIUM 8.4* 8.8*  --  8.8*  GFRNONAA >60 >60  --  59*  GFRAA >60 >60  --  >60  ANIONGAP 9 12  --  9     Hematology Recent Labs  Lab 09/01/17 0430 09/02/17 0258 09/03/17 0329  WBC 15.1* 14.7* 10.8*  RBC 3.00* 3.38* 3.48*  HGB 7.1* 7.8* 8.5*  HCT 22.7* 25.6* 26.5*  MCV 75.7* 75.7* 76.1*  MCH 23.7* 23.1* 24.4*  MCHC 31.3 30.5 32.1  RDW 16.5* 16.4* 17.0*  PLT 223 246 291    Cardiac EnzymesNo results for input(s): TROPONINI in the last 168 hours. No results for input(s): TROPIPOC in the last 168 hours.   BNPNo results for input(s): BNP, PROBNP in the last 168 hours.   DDimer No results for input(s): DDIMER in the last 168 hours.   Radiology    No results found.  Cardiac Studies   Echo 08/28/17 Study Conclusions  - Left ventricle: The cavity size was normal. Wall thickness was   increased in a pattern of moderate LVH. Systolic function was   moderately reduced. The estimated ejection fraction was in the   range of 35% to 40%. Diffuse hypokinesis. Features are consistent   with a pseudonormal left ventricular filling pattern, with   concomitant abnormal relaxation and increased filling pressure   (grade 2 diastolic dysfunction). - Aortic valve: There was no  stenosis. - Mitral valve: There was trivial regurgitation. - Left atrium: The atrium was mildly dilated. - Right ventricle: The cavity size was normal. Systolic function   was normal. - Pulmonary arteries: PA peak pressure: 36 mm Hg (S). - Systemic veins: IVC measured 2.5 cm with < 50% respirophasic   variation, suggesting RA pressure 15 mmHg.  Impressions:  - Normal LV size with moderate LV hypertrophy. EF 35-40%, diffuse   hypokinesis. Moderate diastolic dysfunction. Normal RV size and   systolic function. Dilated IVC suggesting elevated RV filling   pressure. Borderline pulmonary hypertension.   Patient Profile     47 y.o. female admitted 08/27/17 with NSTEMI, hx CVA, Seizures, HTN, DM, and anemia, 3 vessel disease on cath and CABG 08/30/17.  Now with HTN.   Assessment & Plan    HTN  BP improved now on hydralazine needs to lose weight continue Current meds OK to d/c home   CABG POD #5,     LIMA-> LAD, VG->1st diag, VG-> OM, VG, seq-> PDA and PLA, plan for discharge if BP controlled.  Cardiomyopathy ischemic EF 35-40% by Echo 08/28/17 will need f/u in 2 months post revascularization   HLD  On lipitor 80    Outpatient f/u with Dr Jens Som arranged  Charlton Haws

## 2017-09-05 NOTE — Progress Notes (Addendum)
      [address]       [place] [id number]             [phone]        6 Days Post-Op Procedure(s) (LRB): CORONARY ARTERY BYPASS GRAFTING (CABG) x 5 WITH ENDOSCOPIC HARVESTING OF RIGHT SAPHENOUS VEIN (N/A) TRANSESOPHAGEAL ECHOCARDIOGRAM (TEE) (N/A)  Subjective: Patient sitting in chair. Her only complaint is cough, productive.  Objective: Vital signs in last 24 hours: Temp:  [98.3 F (36.8 C)] 98.3 F (36.8 C) (02/19 0609) Pulse Rate:  [90-96] 93 (02/19 0609) Cardiac Rhythm: Normal sinus rhythm (02/18 1900) Resp:  [16-26] 26 (02/19 0609) BP: (131-179)/(83-109) 162/109 (02/19 0609) Weight:  [211 lb 12.8 oz (96.1 kg)] 211 lb 12.8 oz (96.1 kg) (02/19 0144)  Pre op weight 92.7 kg Current Weight  09/05/17 211 lb 12.8 oz (96.1 kg)      Intake/Output from previous day: 02/18 0701 - 02/19 0700 In: 240 [P.O.:240] Out: -    Physical Exam:  Cardiovascular: RRR Pulmonary: Slightly diminished at bases Abdomen: Soft, non tender, bowel sounds present. Extremities: Mild bilateral lower extremity edema. Wounds: Clean and dry.  No erythema or signs of infection.  Lab Results: CBC: Recent Labs    09/03/17 0329  WBC 10.8*  HGB 8.5*  HCT 26.5*  PLT 291   BMET:  Recent Labs    09/03/17 0329 09/03/17 1558 09/04/17 0317  NA 139  --  139  K 3.0* 3.6 3.9  CL 106  --  108  CO2 21*  --  22  GLUCOSE 72  --  86  BUN 16  --  16  CREATININE 1.04*  --  1.11*  CALCIUM 8.8*  --  8.8*    PT/INR:  Lab Results  Component Value Date   INR 1.32 08/30/2017   INR 1.02 08/29/2017   INR 1.03 08/28/2017   ABG:  INR: Will add last result for INR, ABG once components are confirmed Will add last 4 CBG results once components are confirmed  Assessment/Plan:  1. CV - S/p NSTEMI. Hypertensive. On  Clonidine 0.3 mg bid, Losartan 100 mg daily, Lopressor 100 mg bid. Cardiology  evaluated yesterday afternoon. It was recommended to stop Norvasc and start  Hydralazine which I did later yesterday. Will increase Hydralazine this am. She will need close follow up after discharge. 2.  Pulmonary - On room air. Encourage incentive spirometer. 3. Volume Overload - On Lasix 40 mg daily. Will keep on Lasix until seen in office 4.  Acute blood loss anemia - H and H stable yesterday 8.5 and 26.5 5. DM-CBGs 108/69/146. On Glipizide Xl 20 mg daily, Insulin. She was on Metformin 1000 mg bid prior to admission. Will restart at discharge and stop scheduled Insulin. She was non compliant with medications prior to admission.  Pre op HGA1C 10.6. Will need close medical follow up after discharge. If glucose not well controlled and/or HGA1C not improved if taking medication, will need to consider Insulin. 6. Will remove sutures in leg in the office after discharge 7. [facility]   Donielle M ZimmermanPA-C 09/05/2017,7:08 AM Patient seen and examined, agree with above Home today  Viviann SpareSteven C. Dorris FetchHendrickson, MD [facility] [phone]

## 2017-09-05 NOTE — Progress Notes (Signed)
CARDIAC REHAB PHASE I   PRE:  Rate/Rhythm: 100 ST    BP: sitting 157/72    SaO2: 97 RA  MODE:  Ambulation: 370 ft   POST:  Rate/Rhythm: 119 ST    BP: sitting 161/85     SaO2: 95 RA  Pt slowly walked without RW today.  Still SOB with activity, many short rest stops. This should improve with time and conditioning. Pt feeling better about her DM.  [phone]  Harriet Massonandi Kristan Sigismund Cross CES, ACSM 09/05/2017 8:57 AM

## 2017-09-08 ENCOUNTER — Telehealth: Payer: Self-pay | Admitting: Adult Health

## 2017-09-08 MED ORDER — METOPROLOL TARTRATE 50 MG PO TABS: 50.0000 mg | ORAL_TABLET | Freq: Two times a day (BID) | ORAL | 5 refills | Status: AC

## 2017-09-08 MED ORDER — FUROSEMIDE 40 MG PO TABS: 40.0000 mg | ORAL_TABLET | Freq: Every day | ORAL | 5 refills | Status: DC

## 2017-09-08 NOTE — Telephone Encounter (Signed)
Spoke with patient who had a CABG on 2/13  She reports lethargy, she keeps falling asleep even when eating, her feet are swollen like sausages/painful to touch. She has been elevating her legs but it hurts to elevate them, especially the leg where they took a vein graft. She does not monitor weight at home. She does not have a BP cuff at home. Only has chest pain/SOB when she coughs up mucus which is clear.   She has not yet called Dr. Dorris FetchHendrickson

## 2017-09-08 NOTE — Telephone Encounter (Signed)
Michele [name] is calling because her feet is swollen and painful , the top of the feet is turning dark , the one with the suture is very painful . She is cold and hot all the time its one or the other . She falls asleep all the time .

## 2017-09-08 NOTE — Telephone Encounter (Signed)
Spoke with DOD Dr. Herbie BaltimoreHarding who recommended increasing lasix to 40mg  BID until swelling resolves and decreasing metoprolol tartrate to 50mg  BID. Rx(s) sent to pharmacy electronically. Patient agrees w/plan

## 2017-09-11 ENCOUNTER — Encounter: Payer: Self-pay | Admitting: Medical

## 2017-09-11 ENCOUNTER — Telehealth: Payer: Self-pay

## 2017-09-11 ENCOUNTER — Ambulatory Visit (INDEPENDENT_AMBULATORY_CARE_PROVIDER_SITE_OTHER): Payer: 59 | Admitting: Medical

## 2017-09-11 ENCOUNTER — Ambulatory Visit (HOSPITAL_BASED_OUTPATIENT_CLINIC_OR_DEPARTMENT_OTHER)
Admission: RE | Admit: 2017-09-11 | Discharge: 2017-09-11 | Disposition: A | Discharge status: Home / Self Care | Payer: 59 | Source: Ambulatory Visit | Attending: Medical | Admitting: Medical

## 2017-09-11 VITALS — BP 144/77 | HR 80 | Temp 98.6°F | Resp 16 | Ht 60.0 in | Wt 207.2 lb

## 2017-09-11 DIAGNOSIS — J40 Bronchitis, not specified as acute or chronic: Secondary | ICD-10-CM

## 2017-09-11 DIAGNOSIS — E876 Hypokalemia: Secondary | ICD-10-CM

## 2017-09-11 DIAGNOSIS — R0600 Dyspnea, unspecified: Secondary | ICD-10-CM

## 2017-09-11 DIAGNOSIS — R05 Cough: Secondary | ICD-10-CM

## 2017-09-11 DIAGNOSIS — D509 Iron deficiency anemia, unspecified: Secondary | ICD-10-CM | POA: Diagnosis not present

## 2017-09-11 DIAGNOSIS — J90 Pleural effusion, not elsewhere classified: Secondary | ICD-10-CM | POA: Insufficient documentation

## 2017-09-11 DIAGNOSIS — I517 Cardiomegaly: Secondary | ICD-10-CM | POA: Insufficient documentation

## 2017-09-11 DIAGNOSIS — R059 Cough, unspecified: Secondary | ICD-10-CM

## 2017-09-11 DIAGNOSIS — D649 Anemia, unspecified: Secondary | ICD-10-CM

## 2017-09-11 DIAGNOSIS — Z951 Presence of aortocoronary bypass graft: Secondary | ICD-10-CM | POA: Diagnosis not present

## 2017-09-11 LAB — CBC WITH DIFFERENTIAL/PLATELET
BASOS ABS: 0.1 10*3/uL (ref 0.0–0.1)
BASOS PCT: 0.7 % (ref 0.0–3.0)
Eosinophils Absolute: 0.2 10*3/uL (ref 0.0–0.7)
Eosinophils Relative: 1.8 % (ref 0.0–5.0)
HCT: 24.4 % — ABNORMAL LOW (ref 36.0–46.0)
Hemoglobin: 8.1 g/dL — ABNORMAL LOW (ref 12.0–15.0)
LYMPHS ABS: 1.7 10*3/uL (ref 0.7–4.0)
Lymphocytes Relative: 17 % (ref 12.0–46.0)
MCHC: 33.1 g/dL (ref 30.0–36.0)
MCV: 75.8 fl — ABNORMAL LOW (ref 78.0–100.0)
MONO ABS: 0.7 10*3/uL (ref 0.1–1.0)
Monocytes Relative: 6.4 % (ref 3.0–12.0)
NEUTROS ABS: 7.6 10*3/uL (ref 1.4–7.7)
NEUTROS PCT: 74.1 % (ref 43.0–77.0)
PLATELETS: 436 10*3/uL — AB (ref 150.0–400.0)
RBC: 3.21 Mil/uL — ABNORMAL LOW (ref 3.87–5.11)
RDW: 17.1 % — AB (ref 11.5–15.5)
WBC: 10.3 10*3/uL (ref 4.0–10.5)

## 2017-09-11 LAB — BRAIN NATRIURETIC PEPTIDE: Pro B Natriuretic peptide (BNP): 290 pg/mL — ABNORMAL HIGH (ref 0.0–100.0)

## 2017-09-11 MED ORDER — BENZONATATE 100 MG PO CAPS: 100.0000 mg | ORAL_CAPSULE | Freq: Three times a day (TID) | ORAL | 0 refills | Status: DC | PRN

## 2017-09-11 MED ORDER — DOXYCYCLINE HYCLATE 100 MG PO TABS: 100.0000 mg | ORAL_TABLET | Freq: Two times a day (BID) | ORAL | 0 refills | Status: DC

## 2017-09-11 NOTE — Progress Notes (Signed)
Subjective:    Patient ID: Michele [name], female    DOB: 14-Aug-1970, 47 y.o.   MRN: [id number]  HPI  Pt in for follow up.    Hx of stroke- She states 2 strokes in past. Last one was in 2015.   Pt also has hx of seizures- pt is on keppra. No seizure since taking keppra.   Hx of htn- pt is on lasix, losartan, hydralazine and clonidine. She wonders which medicine of all she is on might make her sleepy in am.   Diabetic pt- Pt sugar today high in 200's. Pt states on glipizide. No metformin recently.   Has high cholesterol and on lipoitor.   Hx of fatigue and anemia. Iron deficiency.  Pt recently stopped smoking.  Pt has not been in with me for a while.  Pt has open heart surgery. She had surgery on February 10,2019. Pt incision of chest hurts when cough and line looks puffy. No yellow discharge from incision line.  Pt states cough every day. Occasional dyspnea.  Pt states she is bringing up some mucus at times when cough.   Some swelling of her feet/toes.   Pt has appointment with Dr. Roxan Hockey surgeon on October 03, 2008.    Review of Systems  Respiratory: Positive for cough and shortness of breath. Negative for chest tightness and wheezing.        Rare occasional shortness of breath  Cardiovascular: Negative for chest pain and palpitations.  Gastrointestinal: Negative for abdominal pain and anal bleeding.  Musculoskeletal: Negative for back pain, joint swelling and myalgias.  Skin: Negative for rash.  Neurological: Negative for dizziness, syncope, light-headedness and headaches.  Hematological: Negative for adenopathy. Does not bruise/bleed easily.  Psychiatric/Behavioral: Negative for behavioral problems, decreased concentration and dysphoric mood.    Past Medical History:  Diagnosis Date  . Anemia   . CVA (cerebral infarction)    right side numbness  . Diabetes mellitus   . Hypertension   . Seizures ([place])   . Stroke [facility])      Social History    Socioeconomic History  . Marital status: Married    Spouse name: Montine Circle  . Number of children: Not on file  . Years of education: 61  . Highest education level: Not on file  Social Needs  . Financial resource strain: Not on file  . Food insecurity - worry: Not on file  . Food insecurity - inability: Not on file  . Transportation needs - medical: Not on file  . Transportation needs - non-medical: Not on file  Occupational History  . Occupation: Geneticist, molecular  Tobacco Use  . Smoking status: Current Every Day Smoker    Packs/day: 0.30    Years: 16.00    Pack years: 4.80  . Smokeless tobacco: Never Used  Substance and Sexual Activity  . Alcohol use: Yes  . Drug use: No  . Sexual activity: No  Other Topics Concern  . Not on file  Social History Narrative   Lives with husband and kids   Right-handed   Caffeine use: large cup of coffee Mon through Fri    Past Surgical History:  Procedure Laterality Date  . btl    . CESAREAN SECTION    . CORONARY ARTERY BYPASS GRAFT N/A 08/30/2017   Procedure: CORONARY ARTERY BYPASS GRAFTING (CABG) x 5 WITH ENDOSCOPIC HARVESTING OF RIGHT SAPHENOUS VEIN;  Surgeon: Melrose Nakayama, MD;  Location: [facility];  Service: Open Heart Surgery;  Laterality: N/A;  .  KNEE ARTHROSCOPY    . LEFT HEART CATH AND CORONARY ANGIOGRAPHY N/A 08/28/2017   Procedure: LEFT HEART CATH AND CORONARY ANGIOGRAPHY;  Surgeon: Nelva Bush, MD;  Location: [facility];  Service: Cardiovascular;  Laterality: N/A;  . TEE WITHOUT CARDIOVERSION N/A 08/30/2017   Procedure: TRANSESOPHAGEAL ECHOCARDIOGRAM (TEE);  Surgeon: Melrose Nakayama, MD;  Location: [facility];  Service: Open Heart Surgery;  Laterality: N/A;  . TUBAL LIGATION      Family History  Problem Relation Age of Onset  . Cancer Father   . Lupus Mother   . Diabetes Mother   . Rheum arthritis Mother   . Heart disease Mother   . Stroke Mother   . Kidney disease Maternal Grandmother   .  Heart attack Maternal Grandfather   . Seizures Neg Hx     No Known Allergies  Current Outpatient Medications on File Prior to Visit  Medication Sig Dispense Refill  . aspirin 325 MG tablet Take 1 tablet (325 mg total) by mouth daily.    Marland Kitchen atorvastatin (LIPITOR) 80 MG tablet Take 1 tablet (80 mg total) by mouth daily at 6 PM. 30 tablet 1  . blood glucose meter kit and supplies KIT Dispense based on patient and insurance preference. Use up to four times daily as directed. (FOR ICD-9 250.00, 250.01). 1 each 0  . calcium carbonate (TUMS EX) 750 MG chewable tablet Chew 2 tablets by mouth as needed for heartburn.    . cloNIDine (CATAPRES) 0.3 MG tablet Take 1 tablet (0.3 mg total) by mouth 2 (two) times daily. 60 tablet 1  . ferrous sulfate 325 (65 FE) MG tablet Take 1 tablet (325 mg total) by mouth daily with breakfast. 30 tablet 1  . fluticasone (FLONASE) 50 MCG/ACT nasal spray Place 2 sprays into both nostrils daily. (Patient taking differently: Place 2 sprays into both nostrils as needed for allergies or rhinitis. ) 16 g 0  . furosemide (LASIX) 40 MG tablet Take 1 tablet (40 mg total) by mouth daily. 30 tablet 5  . glipiZIDE (GLUCOTROL XL) 10 MG 24 hr tablet Take 2 tablets (20 mg total) by mouth daily. 60 tablet 1  . glucose monitoring kit (FREESTYLE) monitoring kit 1 each by Does not apply route as needed for other. 1 each 0  . guaiFENesin 200 MG tablet Take 1 tablet (200 mg total) by mouth every 6 (six) hours as needed for cough or to loosen phlegm. 30 tablet 0  . hydrALAZINE (APRESOLINE) 50 MG tablet Take 1 tablet (50 mg total) by mouth every 8 (eight) hours. 90 tablet 1  . levETIRAcetam (KEPPRA) 750 MG tablet Take 1 tablet (750 mg total) by mouth 2 (two) times daily. 60 tablet 11  . lidocaine (XYLOCAINE) 2 % solution Use as directed 15 mLs in the mouth or throat as needed for mouth pain. 100 mL 0  . losartan (COZAAR) 100 MG tablet Take 1 tablet (100 mg total) by mouth daily. 30 tablet 1  .  metFORMIN (GLUCOPHAGE) 1000 MG tablet Take 1,000 mg by mouth daily with breakfast.     . metoprolol tartrate (LOPRESSOR) 50 MG tablet Take 1 tablet (50 mg total) by mouth 2 (two) times daily. 60 tablet 5  . oxyCODONE (OXY IR/ROXICODONE) 5 MG immediate release tablet Take 5 mg by mouth every 4-6 hours PRN severe pain. 30 tablet 0  . potassium chloride SA (KLOR-CON M15) 15 MEQ tablet Take 2 tablets (30 mEq total) by mouth daily. 60 tablet 0  .  Potassium Citrate 15 MEQ (1620 MG) TBCR      No current facility-administered medications on file prior to visit.     BP (!) 144/77   Pulse 80   Temp 98.6 F (37 C) (Oral)   Resp 16   Ht 5' (1.524 m)   Wt 207 lb 3.2 oz (94 kg)   LMP 08/17/2017   SpO2 99%   BMI 40.47 kg/m       Objective:   Physical Exam  General  Mental Status - Alert. General Appearance - Well groomed. Not in acute distress.  Skin Rashes- No Rashes.  Midline chest scar from surgery looks like it is healing well.  No redness.  No discharge.  HEENT Head- Normal. Ear Auditory Canal - Left- Normal. Right - Normal.Tympanic Membrane- Left- Normal. Right- Normal. Eye Sclera/Conjunctiva- Left- Normal. Right- Normal. Nose & Sinuses Nasal Mucosa- Left-  Not boggy or Congested. Right-  Not  boggy or Congested. Mouth & Throat Lips: Upper Lip- Normal: no dryness, cracking, pallor, cyanosis, or vesicular eruption. Lower Lip-Normal: no dryness, cracking, pallor, cyanosis or vesicular eruption. Buccal Mucosa- Bilateral- No Aphthous ulcers. Oropharynx- No Discharge or Erythema. Tonsils: Characteristics- Bilateral- No Erythema or Congestion. Size/Enlargement- Bilateral- No enlargement. Discharge- bilateral-None.  Neck Neck- Supple. No Masses.  No JVD.   Chest and Lung Exam Auscultation: Breath Sounds:- even and unlabored, but bilateral upper lobe rhonchi.  Cardiovascular Auscultation:Rythm- Regular, rate and rhythm. Murmurs & Other Heart Sounds:Ausculatation of the heart  reveal- No Murmurs.  Lymphatic Head & Neck General Head & Neck Lymphatics: Bilateral: Description- No Localized lymphadenopathy.  Lower extremity-calves symmetric.  1+ pedal edema bilaterally.  Negative Homans sign bilaterally.       Assessment & Plan:  For your history of productive and persistent cough since her surgery, I am prescribing doxycycline antibiotic and benzonatate.  We will treat for bronchitis but get x-ray to rule out any pneumonia.  Also based on her cardiac history, recent surgery and low EF, I will go ahead and get chest x-ray.  Blood work will include BNP.  For history of low potassium and anemia, I am ordering cbc and cmp.   For high blood pressure, recommend that you do stop drinking coffee/caffeine beverages.  BP is relatively well controlled today.   Follow-up in 2 weeks or as needed.  Mackie Pai, PA-C

## 2017-09-11 NOTE — Patient Instructions (Addendum)
For your history of productive and persistent cough since her surgery, I am prescribing doxycycline antibiotic and benzonatate.  We will treat for bronchitis but get x-ray to rule out any pneumonia.  Also based on her cardiac history, recent surgery and low EF, I will go ahead and get chest x-ray.  Blood work will include BNP.  For history of low potassium and anemia, I am ordering cbc and cmp.   For high blood pressure, recommend that you do stop drinking coffee/caffeine beverages.  BP is relatively well controlled today.   Follow-up in 2 weeks or as needed.

## 2017-09-11 NOTE — Telephone Encounter (Signed)
Michele LimHolly a nurse with [facility] called [phone] called to notify the office of Michele [name]'s blood pressure of 172/102.  She stated that she would be following her closely.  She stated that she had not taken her medication yet today and advised her to take it right away.  Patient acknowledged receipt.  She also stated that the patient has a follow-up appointment with her PCP today.  Also told her to tell her patient when her follow-up appointment with Cardiology was 09/18/2017, she acknowledged receipt.

## 2017-09-12 ENCOUNTER — Telehealth: Payer: Self-pay | Admitting: Medical

## 2017-09-12 DIAGNOSIS — E785 Hyperlipidemia, unspecified: Secondary | ICD-10-CM

## 2017-09-12 DIAGNOSIS — I2510 Atherosclerotic heart disease of native coronary artery without angina pectoris: Secondary | ICD-10-CM

## 2017-09-12 LAB — COMPREHENSIVE METABOLIC PANEL
ALBUMIN: 3 g/dL — AB (ref 3.5–5.2)
ALT: 13 U/L (ref 0–35)
AST: 9 U/L (ref 0–37)
Alkaline Phosphatase: 52 U/L (ref 39–117)
BILIRUBIN TOTAL: 0.5 mg/dL (ref 0.2–1.2)
BUN: 12 mg/dL (ref 6–23)
CALCIUM: 9.3 mg/dL (ref 8.4–10.5)
CHLORIDE: 101 meq/L (ref 96–112)
CO2: 28 mEq/L (ref 19–32)
CREATININE: 1.13 mg/dL (ref 0.40–1.20)
GFR: 66.47 mL/min (ref >60.00)
Glucose, Bld: 202 mg/dL — ABNORMAL HIGH (ref 70–99)
Potassium: 4.2 mEq/L (ref 3.5–5.1)
Sodium: 135 mEq/L (ref 135–145)
Total Protein: 7.6 g/dL (ref 6.0–8.3)

## 2017-09-12 NOTE — Telephone Encounter (Signed)
Opened to review and advise pt.

## 2017-09-12 NOTE — Telephone Encounter (Signed)
Patient states that cardiologist appointment would be best Thursday afternoon or on Friday.  Would you coordinate with Gwen or Selena BattenKim and notify them of patient's preference.

## 2017-09-14 ENCOUNTER — Other Ambulatory Visit: Payer: Self-pay

## 2017-09-14 ENCOUNTER — Ambulatory Visit (INDEPENDENT_AMBULATORY_CARE_PROVIDER_SITE_OTHER): Payer: Self-pay

## 2017-09-14 DIAGNOSIS — Z4802 Encounter for removal of sutures: Secondary | ICD-10-CM

## 2017-09-14 NOTE — Progress Notes (Signed)
Michele [name] [facility] today [facility] s/p CABG x5 08/30/2017.  6 Sutures removed with no signs/ symptoms of infection noted.  However, she did have slight serosanguinous fluid from one incision site on leg.  She did have 3+ dependant edema bilaterally which could be cause of fluid.  She stated that she had not been keeping her legs elevated when sitting, and has not done a lot of walking.  Advised patient to make sure she is walking and to elevate her legs at heart level to help aid swelling, and to continue taking her medications.  She was noted to have a steri-strip in place, which was removed and incision site dehisced.  Iodoform packing used in the 1 inch opening and instructed to change daily and give us a call if she needed.  Home Health is to come out to her house tomorrow 09/15/2017 and assess the wound again.  Mid sternal incision healing appropriately. Patient tolerated procedure well.  Patient/ family instructed to keep the incision sites clean and dry.  Patient/ family acknowledged instructions given.

## 2017-09-17 NOTE — Progress Notes (Signed)
Cardiology Office Note   Date:  09/18/2017   ID:  Michele [name], DOB 1971-01-13, MRN [id number]  PCP:  Mackie Pai, PA-C  Cardiologist:  Dr. Johnsie Cancel Chief Complaint  Patient presents with  . Follow-up  . Panic Attack    09/17/17  . Hypertension    discuss changing from Hydralazine     History of Present Illness: Michele [name] is a 47 y.o. female who presents for posthospitalization follow-up after being seen on consultation after non-ST elevation MI, with history of CVA, seizures, hypertension, diabetes, and anemia.  The patient underwent cardiac catheterization revealing three-vessel disease with subsequent CABG on 08/30/2017 (LIMA to LAD, VG to first diagonal, SVG to OM, VG sequential to PDA and PLA).    The patient was also found to have ischemic cardiomyopathy with an EF of 35% to 40% by echocardiogram on 08/28/2017.  (The patient will require 2 months post echocardiogram after this revascularization).  The patient's blood pressure was better controlled during hospitalization with the addition of hydralazine to other regimen which included losartan and clonidine.  She comes today abdomen about stopping clonidine. She is not taking the dose as prescribed on discharge as she states it makes her too sleepy during the day. She also is complaining ongoing dressing changes to her chest tubes which had not healed. She has to pack wound and redress it herself and this causes her significant anxiety.  Past Medical History:  Diagnosis Date  . Anemia   . CVA (cerebral infarction)    right side numbness  . Diabetes mellitus   . Hypertension   . Seizures ([place])   . Stroke [facility])     Past Surgical History:  Procedure Laterality Date  . btl    . CESAREAN SECTION    . CORONARY ARTERY BYPASS GRAFT N/A 08/30/2017   Procedure: CORONARY ARTERY BYPASS GRAFTING (CABG) x 5 WITH ENDOSCOPIC HARVESTING OF RIGHT SAPHENOUS VEIN;  Surgeon: Melrose Nakayama, MD;  Location: [place];  Service: Open Heart  Surgery;  Laterality: N/A;  . KNEE ARTHROSCOPY    . LEFT HEART CATH AND CORONARY ANGIOGRAPHY N/A 08/28/2017   Procedure: LEFT HEART CATH AND CORONARY ANGIOGRAPHY;  Surgeon: Nelva Bush, MD;  Location: [place] CV LAB;  Service: Cardiovascular;  Laterality: N/A;  . TEE WITHOUT CARDIOVERSION N/A 08/30/2017   Procedure: TRANSESOPHAGEAL ECHOCARDIOGRAM (TEE);  Surgeon: Melrose Nakayama, MD;  Location: [place];  Service: Open Heart Surgery;  Laterality: N/A;  . TUBAL LIGATION       Current Outpatient Medications  Medication Sig Dispense Refill  . aspirin 325 MG tablet Take 1 tablet (325 mg total) by mouth daily.    Marland Kitchen atorvastatin (LIPITOR) 80 MG tablet Take 1 tablet (80 mg total) by mouth daily at 6 PM. 30 tablet 1  . benzonatate (TESSALON) 100 MG capsule Take 1 capsule (100 mg total) by mouth 3 (three) times daily as needed for cough. 21 capsule 0  . blood glucose meter kit and supplies KIT Dispense based on patient and insurance preference. Use up to four times daily as directed. (FOR ICD-9 250.00, 250.01). 1 each 0  . calcium carbonate (TUMS EX) 750 MG chewable tablet Chew 2 tablets by mouth as needed for heartburn.    . cloNIDine (CATAPRES) 0.2 MG tablet Take 1 tablet (0.2 mg total) by mouth daily. 8 tablet 0  . doxycycline (VIBRA-TABS) 100 MG tablet Take 1 tablet (100 mg total) by mouth 2 (two) times daily. Can give caps or generic 20 tablet  0  . ferrous sulfate 325 (65 FE) MG tablet Take 1 tablet (325 mg total) by mouth daily with breakfast. 30 tablet 1  . furosemide (LASIX) 40 MG tablet Take 1 tablet (40 mg total) by mouth daily. 30 tablet 5  . glipiZIDE (GLUCOTROL XL) 10 MG 24 hr tablet Take 2 tablets (20 mg total) by mouth daily. 60 tablet 1  . glucose monitoring kit (FREESTYLE) monitoring kit 1 each by Does not apply route as needed for other. 1 each 0  . hydrALAZINE (APRESOLINE) 50 MG tablet Take 1 tablet (50 mg total) by mouth 3 (three) times daily. 90 tablet 5  . levETIRAcetam  (KEPPRA) 750 MG tablet Take 1 tablet (750 mg total) by mouth 2 (two) times daily. 60 tablet 11  . lidocaine (XYLOCAINE) 2 % solution Use as directed 15 mLs in the mouth or throat as needed for mouth pain. 100 mL 0  . losartan (COZAAR) 100 MG tablet Take 1 tablet (100 mg total) by mouth daily. 30 tablet 1  . metoprolol tartrate (LOPRESSOR) 50 MG tablet Take 1 tablet (50 mg total) by mouth 2 (two) times daily. 60 tablet 5  . potassium chloride SA (KLOR-CON M15) 15 MEQ tablet Take 2 tablets (30 mEq total) by mouth daily. 60 tablet 0  . Potassium Citrate 15 MEQ (1620 MG) TBCR      No current facility-administered medications for this visit.     Allergies:   Patient has no known allergies.    Social History:  The patient  reports that she quit smoking about 3 weeks ago. She has a 4.80 pack-year smoking history. she has never used smokeless tobacco. She reports that she drinks alcohol. She reports that she does not use drugs.   Family History:  The patient's family history includes Cancer in her father; Diabetes in her mother; Heart attack in her maternal grandfather; Heart disease in her mother; Kidney disease in her maternal grandmother; Lupus in her mother; Rheum arthritis in her mother; Stroke in her mother.    ROS: All other systems are reviewed and negative. Unless otherwise mentioned in H&P    PHYSICAL EXAM: VS:  BP (!) 150/90   Pulse 80   Ht 5' 1"  (1.549 m)   Wt 196 lb 9.6 oz (89.2 kg)   BMI 37.15 kg/m  , BMI Body mass index is 37.15 kg/m. GEN: Well nourished, well developed, in no acute distress  HEENT: normal  Neck: no JVD, carotid bruits, or masses Cardiac: RRR; no murmurs, rubs, or gallops,no edema  Respiratory:  clear to auscultation bilaterally, normal work of breathing GI: soft, nontender, nondistended, + BS MS: no deformity or atrophy well healed sternotomy and SVG harvest site on the right leg. Dressing in the upper abdomen. No evidence of bleeding through the  dressing. Skin: warm and dry, no rash Neuro:  Strength and sensation are intact Psych: euthymic mood, full affect   Recent Labs: 08/28/2017: TSH 1.144 08/31/2017: Magnesium 2.7 09/11/2017: ALT 13; BUN 12; Creatinine, Ser 1.13; Hemoglobin 8.1 Repeated and verified X2.; Platelets 436.0; Potassium 4.2; Pro B Natriuretic peptide (BNP) 290.0; Sodium 135    Lipid Panel    Component Value Date/Time   CHOL 170 03/07/2017 0953   TRIG 116.0 03/07/2017 0953   HDL 31.40 (L) 03/07/2017 0953   CHOLHDL 5 03/07/2017 0953   VLDL 23.2 03/07/2017 0953   LDLCALC 116 (H) 03/07/2017 0953      Wt Readings from Last 3 Encounters:  09/18/17 196 lb 9.6  oz (89.2 kg)  09/11/17 207 lb 3.2 oz (94 kg)  09/05/17 211 lb 12.8 oz (96.1 kg)      Other studies Reviewed: Cardiac Cath 08/28/2017   Conclusions: 1. Significant 3-vessel coronary artery disease. 2. Moderately reduced left ventricular contraction. 3. Moderately to severely elevated left ventricular filling pressure. 4. Severe systemic hypertension.  Recommendations: 1. Cardiac surgery consultation for CABG, given 3-vessel disease, diabetes mellitus, and reduced LVEF. 2. Restart heparin infusion 4 hours after radial sheath removal. 3. Aggressive secondary prevention. 4. Diuresis and improved blood pressure control.    Echocardiogram 08/28/2017 Echocardiogram Left ventricle: The cavity size was normal. Wall thickness was   increased in a pattern of moderate LVH. Systolic function was   moderately reduced. The estimated ejection fraction was in the   range of 35% to 40%. Diffuse hypokinesis. Features are consistent   with a pseudonormal left ventricular filling pattern, with   concomitant abnormal relaxation and increased filling pressure   (grade 2 diastolic dysfunction). - Aortic valve: There was no stenosis. - Mitral valve: There was trivial regurgitation. - Left atrium: The atrium was mildly dilated. - Right ventricle: The cavity size was  normal. Systolic function   was normal. - Pulmonary arteries: PA peak pressure: 36 mm Hg (S). - Systemic veins: IVC measured 2.5 cm with < 50% respirophasic   variation, suggesting RA pressure 15 mmHg.  Impressions:  - Normal LV size with moderate LV hypertrophy. EF 35-40%, diffuse   hypokinesis. Moderate diastolic dysfunction. Normal RV size and   systolic function. Dilated IVC suggesting elevated RV filling   pressure. Borderline pulmonary hypertension.  ASSESSMENT AND PLAN:  1.  Coronary artery disease: Status post CABG as documented above. She is sore, but is doing okay now. She is concerned about the dressing she has to change in her upper abdomen from prior chest tube. She is continuing to follow with her PCP and thoracic surgery.  She continues on secondary prevention atorvastatin, metoprolol and losartan for blood pressure and heart rate control.  2. Ischemic cardiomypathy: Most recent EF 35%-40%. Patient continues on Lasix and potassium supplement. She will need to have repeat echocardiogram, in approximately 3 months to evaluate if reperfusion from CABG has improved her LV function. Advised her that she can crush the potassium as she states that she is having trouble swallowing it.  3. Hypertension: Elevated today. She refuses to take clonidine twice a day as she states that the a.m. dose, causes her to be extremely sleepy throughout the day. She can function. She is taking the evening dose. Blood pressure is elevated at at 150/90.  I will try and wean her off her clonidine over a week's time. She can begin to take 2 mg at at bedtime, but we'll need to increase her hydralazine to 50 mg twice a day in order to supplement antihypertensive therapy and keep blood pressure control. I have explained to her that she cannot stop clonidine suddenly. That is something that she has to wean. After 4 days of decreasing clonidine to 2 mg, she will decrease clonidine to 1 mg. She will come back  for a blood pressure check to evaluate what her blood pressure status is before we wean her completely off of clonidine, and may need to increase hydralazine dose to supplement.  She verbalizes understanding and has been given written instructions.  Current medicines are reviewed at length with the patient today.    Labs/ tests ordered today include: None  Phill Myron. West Pugh,  ANP, AACC   09/18/2017 4:54 PM    [facility] [address] [place], [place] [address] Phone: [phone]; Fax: [phone]

## 2017-09-18 ENCOUNTER — Encounter: Payer: Self-pay | Admitting: Adult Health

## 2017-09-18 ENCOUNTER — Ambulatory Visit: Payer: 59 | Admitting: Adult Health

## 2017-09-18 VITALS — BP 150/90 | HR 80 | Ht 61.0 in | Wt 196.6 lb

## 2017-09-18 DIAGNOSIS — I10 Essential (primary) hypertension: Secondary | ICD-10-CM | POA: Diagnosis not present

## 2017-09-18 DIAGNOSIS — I2510 Atherosclerotic heart disease of native coronary artery without angina pectoris: Secondary | ICD-10-CM | POA: Diagnosis not present

## 2017-09-18 DIAGNOSIS — I43 Cardiomyopathy in diseases classified elsewhere: Secondary | ICD-10-CM | POA: Diagnosis not present

## 2017-09-18 MED ORDER — CLONIDINE HCL 0.2 MG PO TABS: 0.2000 mg | ORAL_TABLET | Freq: Every day | ORAL | 0 refills | Status: DC

## 2017-09-18 MED ORDER — HYDRALAZINE HCL 50 MG PO TABS: 50.0000 mg | ORAL_TABLET | Freq: Three times a day (TID) | ORAL | 5 refills | Status: AC

## 2017-09-18 NOTE — Patient Instructions (Signed)
Medication Instructions:  Titrate CLONIDINE 0.2mg  x4 days then 0.1mg  x4 days then none  INCREASE HYDRALAZINE TO THREE TIMES DAILY  If you need a refill on your cardiac medications before your next appointment, please call your pharmacy.  Special Instructions: SCHEDULE BP CHECK WITH PHARMACIST IN 5 DAYS (FRIDAY 09-22-17)  Follow-Up: Your physician wants you to follow-up in: 3 MONTHS WITH DR Jens SomRENSHAW.  Thank you for choosing [facility]!!

## 2017-09-19 NOTE — Progress Notes (Signed)
This is Crenshaw's patient

## 2017-09-22 ENCOUNTER — Telehealth: Payer: Self-pay | Admitting: Medical

## 2017-09-22 ENCOUNTER — Ambulatory Visit: Payer: 59

## 2017-09-22 ENCOUNTER — Telehealth: Payer: Self-pay | Admitting: Cardiology

## 2017-09-22 NOTE — Telephone Encounter (Signed)
Becca with home health calling to get continuation of orders-requesting 6 more visit, 1 a week for 6weeks

## 2017-09-22 NOTE — Telephone Encounter (Signed)
Returned the call to [place]. She stated that she called the cardio thoracic surgeon's office and they told her to call this office for home health orders. She has been informed that the patient has not seen the cardiologist yet. She will call the PCP and try them. She will call back if she needs anything further.

## 2017-09-22 NOTE — Telephone Encounter (Signed)
Copied from CRM [id number]. 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  Lurena JoinerRebecca [phone]

## 2017-09-25 ENCOUNTER — Encounter: Payer: Self-pay | Admitting: Medical

## 2017-09-25 ENCOUNTER — Ambulatory Visit: Payer: 59 | Admitting: Medical

## 2017-09-25 VITALS — BP 160/90 | HR 77 | Temp 98.0°F | Resp 16 | Ht 61.0 in | Wt 193.0 lb

## 2017-09-25 DIAGNOSIS — E785 Hyperlipidemia, unspecified: Secondary | ICD-10-CM | POA: Diagnosis not present

## 2017-09-25 DIAGNOSIS — Z8673 Personal history of transient ischemic attack (TIA), and cerebral infarction without residual deficits: Secondary | ICD-10-CM | POA: Diagnosis not present

## 2017-09-25 DIAGNOSIS — I10 Essential (primary) hypertension: Secondary | ICD-10-CM

## 2017-09-25 DIAGNOSIS — I2510 Atherosclerotic heart disease of native coronary artery without angina pectoris: Secondary | ICD-10-CM

## 2017-09-25 DIAGNOSIS — Z87898 Personal history of other specified conditions: Secondary | ICD-10-CM | POA: Diagnosis not present

## 2017-09-25 DIAGNOSIS — E1165 Type 2 diabetes mellitus with hyperglycemia: Secondary | ICD-10-CM | POA: Diagnosis not present

## 2017-09-25 LAB — COMPREHENSIVE METABOLIC PANEL
ALK PHOS: 61 U/L (ref 39–117)
ALT: 13 U/L (ref 0–35)
AST: 13 U/L (ref 0–37)
Albumin: 3.4 g/dL — ABNORMAL LOW (ref 3.5–5.2)
BILIRUBIN TOTAL: 0.6 mg/dL (ref 0.2–1.2)
BUN: 11 mg/dL (ref 6–23)
CALCIUM: 9.7 mg/dL (ref 8.4–10.5)
CO2: 30 mEq/L (ref 19–32)
Chloride: 101 mEq/L (ref 96–112)
Creatinine, Ser: 1.04 mg/dL (ref 0.40–1.20)
GFR: 73.14 mL/min (ref >60.00)
GLUCOSE: 135 mg/dL — AB (ref 70–99)
Potassium: 3.6 mEq/L (ref 3.5–5.1)
Sodium: 138 mEq/L (ref 135–145)
TOTAL PROTEIN: 7.9 g/dL (ref 6.0–8.3)

## 2017-09-25 MED ORDER — METFORMIN HCL 500 MG PO TABS: 500.0000 mg | ORAL_TABLET | Freq: Two times a day (BID) | ORAL | 3 refills | Status: AC

## 2017-09-25 NOTE — Progress Notes (Signed)
Subjective:    Patient ID: Michele [name], female    DOB: 10/06/1970, 47 y.o.   MRN: [id number]  HPI  Pt in for follow up. Since last sa pt she has seen cardiologist.   Pt has history of CABG. She is doing some very light exercise around the house. Pt has EF 35-40%.  Pt states clonidine makes her too sleepy. So she can tolerate it. Cardiologist modifying her bp meds. Plan is below.  I will try and wean her off her clonidine over a week's time. She can begin to take 2 mg at at bedtime, but we'll need to increase her hydralazine to 50 mg twice a day in order to supplement antihypertensive therapy and keep blood pressure control. I have explained to her that she cannot stop clonidine suddenly. That is something that she has to wean. After 4 days of decreasing clonidine to 2 mg, she will decrease clonidine to 1 mg. She will come back for a blood pressure check to evaluate what her blood pressure status is before we wean her completely off of clonidine, and may need to increase hydralazine dose to supplement.  Pt states still doing incentive spirometry. No productive cough and no fever.   Pt cardio thoracic surgeon appointment on March 19,2000.  Pt states this weekend her rt hand shaked little bit right arm when she reached out to get drink from her friend. Describes mild focal shaking/trembling. This lasted for 3 minutes and then resolved. No effect of rt lower ext. Pt has history of focal seizures rt side extremity. Sometimes rt lower ext in past. Past sees neurologist May 2017. Hx of eeg around then and then also imaging studies.   Hx of diabetes and atheroscloerosis.Has history of multiple strokes in the past.  Pt last saw Dr. Jaynee Eagles in the past for neurologist. She referred pt to to Dr Erlinda Hong to evaluate her as well. MRI showed    Chronic infarct in the left frontal temporal lobe. Chronic infarct in the left thalamus and right posterior putamen. Chronic infarct in the right inferior cerebellum  and left inferior cerebellum.  Dr Jaynee Eagles strokes: non compliance, no new symptoms, diffuse atherosclerosis due to non compliance, hypercoag panel neg, discussed TEE and further testing and she would like to defer   Pt admits to using hydralazine 50 mg bid. Her sig stakes tid.      Review of Systems  Constitutional: Negative for chills, fatigue and fever.  Respiratory: Negative for cough, chest tightness, shortness of breath and wheezing.   Cardiovascular: Negative for chest pain and palpitations.  Gastrointestinal: Negative for abdominal distention, abdominal pain, diarrhea and vomiting.  Genitourinary: Negative for decreased urine volume, dyspareunia, dysuria and vaginal pain.  Musculoskeletal: Negative for back pain and myalgias.  Skin: Negative for rash.  Neurological: Negative for dizziness, tremors, syncope, speech difficulty, weakness, light-headedness and headaches.       See hpi on rt upper ext symptoms this weekend.  Hematological: Negative for adenopathy. Does not bruise/bleed easily.  Psychiatric/Behavioral: Negative for behavioral problems, confusion and dysphoric mood. The patient is not nervous/anxious.     Past Medical History:  Diagnosis Date  . Anemia   . CVA (cerebral infarction)    right side numbness  . Diabetes mellitus   . Hypertension   . Seizures ([place])   . Stroke [facility])      Social History   Socioeconomic History  . Marital status: Married    Spouse name: Montine Circle  . Number of children:  Not on file  . Years of education: 41  . Highest education level: Not on file  Social Needs  . Financial resource strain: Not on file  . Food insecurity - worry: Not on file  . Food insecurity - inability: Not on file  . Transportation needs - medical: Not on file  . Transportation needs - non-medical: Not on file  Occupational History  . Occupation: Geneticist, molecular  Tobacco Use  . Smoking status: Former Smoker    Packs/day: 0.30    Years: 16.00     Pack years: 4.80    Last attempt to quit: 08/27/2017    Years since quitting: 0.0  . Smokeless tobacco: Never Used  Substance and Sexual Activity  . Alcohol use: Yes  . Drug use: No  . Sexual activity: No  Other Topics Concern  . Not on file  Social History Narrative   Lives with husband and kids   Right-handed   Caffeine use: large cup of coffee Mon through Fri    Past Surgical History:  Procedure Laterality Date  . btl    . CESAREAN SECTION    . CORONARY ARTERY BYPASS GRAFT N/A 08/30/2017   Procedure: CORONARY ARTERY BYPASS GRAFTING (CABG) x 5 WITH ENDOSCOPIC HARVESTING OF RIGHT SAPHENOUS VEIN;  Surgeon: Melrose Nakayama, MD;  Location: [place];  Service: Open Heart Surgery;  Laterality: N/A;  . KNEE ARTHROSCOPY    . LEFT HEART CATH AND CORONARY ANGIOGRAPHY N/A 08/28/2017   Procedure: LEFT HEART CATH AND CORONARY ANGIOGRAPHY;  Surgeon: Nelva Bush, MD;  Location: [place] CV LAB;  Service: Cardiovascular;  Laterality: N/A;  . TEE WITHOUT CARDIOVERSION N/A 08/30/2017   Procedure: TRANSESOPHAGEAL ECHOCARDIOGRAM (TEE);  Surgeon: Melrose Nakayama, MD;  Location: [place];  Service: Open Heart Surgery;  Laterality: N/A;  . TUBAL LIGATION      Family History  Problem Relation Age of Onset  . Cancer Father   . Lupus Mother   . Diabetes Mother   . Rheum arthritis Mother   . Heart disease Mother   . Stroke Mother   . Kidney disease Maternal Grandmother   . Heart attack Maternal Grandfather   . Seizures Neg Hx     No Known Allergies  Current Outpatient Medications on File Prior to Visit  Medication Sig Dispense Refill  . aspirin 325 MG tablet Take 1 tablet (325 mg total) by mouth daily.    Marland Kitchen atorvastatin (LIPITOR) 80 MG tablet Take 1 tablet (80 mg total) by mouth daily at 6 PM. 30 tablet 1  . benzonatate (TESSALON) 100 MG capsule Take 1 capsule (100 mg total) by mouth 3 (three) times daily as needed for cough. 21 capsule 0  . blood glucose meter kit and supplies KIT  Dispense based on patient and insurance preference. Use up to four times daily as directed. (FOR ICD-9 250.00, 250.01). 1 each 0  . calcium carbonate (TUMS EX) 750 MG chewable tablet Chew 2 tablets by mouth as needed for heartburn.    . cloNIDine (CATAPRES) 0.2 MG tablet Take 1 tablet (0.2 mg total) by mouth daily. 8 tablet 0  . doxycycline (VIBRA-TABS) 100 MG tablet Take 1 tablet (100 mg total) by mouth 2 (two) times daily. Can give caps or generic 20 tablet 0  . ferrous sulfate 325 (65 FE) MG tablet Take 1 tablet (325 mg total) by mouth daily with breakfast. 30 tablet 1  . furosemide (LASIX) 40 MG tablet Take 1 tablet (40 mg total)  by mouth daily. 30 tablet 5  . glipiZIDE (GLUCOTROL XL) 10 MG 24 hr tablet Take 2 tablets (20 mg total) by mouth daily. 60 tablet 1  . glucose monitoring kit (FREESTYLE) monitoring kit 1 each by Does not apply route as needed for other. 1 each 0  . hydrALAZINE (APRESOLINE) 50 MG tablet Take 1 tablet (50 mg total) by mouth 3 (three) times daily. 90 tablet 5  . levETIRAcetam (KEPPRA) 750 MG tablet Take 1 tablet (750 mg total) by mouth 2 (two) times daily. 60 tablet 11  . lidocaine (XYLOCAINE) 2 % solution Use as directed 15 mLs in the mouth or throat as needed for mouth pain. 100 mL 0  . losartan (COZAAR) 100 MG tablet Take 1 tablet (100 mg total) by mouth daily. 30 tablet 1  . metoprolol tartrate (LOPRESSOR) 50 MG tablet Take 1 tablet (50 mg total) by mouth 2 (two) times daily. 60 tablet 5  . potassium chloride SA (KLOR-CON M15) 15 MEQ tablet Take 2 tablets (30 mEq total) by mouth daily. 60 tablet 0  . Potassium Citrate 15 MEQ (1620 MG) TBCR      No current facility-administered medications on file prior to visit.     BP (!) 145/86   Pulse 77   Temp 98 F (36.7 C) (Oral)   Resp 16   Ht 5' 1" (1.549 m)   Wt 193 lb (87.5 kg)   SpO2 100%   BMI 36.47 kg/m       Objective:   Physical Exam  General Mental Status- Alert. General Appearance- Not in acute  distress.   Skin General: Color- Normal Color. Moisture- Normal Moisture.  Neck Carotid Arteries- Normal color. Moisture- Normal Moisture. No carotid bruits. No JVD.  Chest and Lung Exam Auscultation: Breath Sounds:-Normal.  Cardiovascular Auscultation:Rythm- Regular. Murmurs & Other Heart Sounds:Auscultation of the heart reveals- No Murmurs.  Abdomen Inspection:-Inspeection Normal. Palpation/Percussion:Note:No mass. Palpation and Percussion of the abdomen reveal- Non Tender, Non Distended + BS, no rebound or guarding.    Neurologic Cranial Nerve exam:- CN III-XII intact(No nystagmus), symmetric smile. Drift Test:- No drift. Romberg Exam:- Negative.  Strength:- 5/5 equal and symmetric strength both upper and lower extremities.     Assessment & Plan:  Your blood pressure today when I checked it was 160/90.  Continue your current blood pressure medication but please increase the hydralazine 50 mg 3 times daily.  You are recently only using it twice daily.  Check your blood pressures daily as well when you are relaxed.  Please update me on my chart by blood sugar levels.  Which her blood pressure levels are.  If any high blood pressure with neurologic or cardiac type signs or symptoms then recommend ED evaluation.  Also I want you to start checking your blood sugars fasting in the morning and twice daily as well post meal.  Write those numbers down and also give me an update this Thursday.  I wrote metformin prescription today.  Continue glipizide.   You had brief episode of right upper extremity shaking for a couple of minutes this weekend.  This may have represented a recurrent focal seizure as you had those in the past.  Your neurologic exam is normal presently.  I am going to get Keppra level today.  I consider doing imaging studies today but in light of your asymptomatic status and normal neurologic exam, I decided not to do imaging presently.  However again if you have any  cardiac or neurologic signs  or symptoms then be seen at the emergency department as imaging would be needed in light of your history.  I am going to refer you to Dr.Xu as Dr. Jaynee Eagles had recommended that in the past.  When I get back to [place] level I will also send her the results to get advice regarding dose adjustment if necessary.  Follow-up in 3 weeks or as needed.  Total of 40 minutes spent with patient today.  50% of time was spent counseling with her various medical conditions.  Particularly how we would approach her symptoms of right upper extremity shaking this weekend.  Please referral to neurologist and will need to counsel her further once Keppra level come back and message sent to her neurologist about dosing instruction. Mackie Pai, PA-C

## 2017-09-25 NOTE — Patient Instructions (Signed)
Your blood pressure today when I checked it was 160/90.  Continue your current blood pressure medication but please increase the hydralazine 50 mg 3 times daily.  You are recently only using it twice daily.  Check your blood pressures daily as well when you are relaxed.  Please update me on my chart by blood sugar levels.  Which her blood pressure levels are.  If any high blood pressure with neurologic or cardiac type signs or symptoms then recommend ED evaluation.  Also I want you to start checking your blood sugars fasting in the morning and twice daily as well post meal.  Write those numbers down and also give me an update this Thursday.  I wrote metformin prescription today.  Continue glipizide.   You had brief episode of right upper extremity shaking for a couple of minutes this weekend.  This may have represented a recurrent focal seizure as you had those in the past.  Your neurologic exam is normal presently.  I am going to get Keppra level today.  I consider doing imaging studies today but in light of your asymptomatic status and normal neurologic exam, I decided not to do imaging presently.  However again if you have any cardiac or neurologic signs or symptoms then be seen at the emergency department as imaging would be needed in light of your history.  I am going to refer you to Dr.Xu as Dr. Lucia GaskinsAhern had recommended that in the past.  When I get back to Keppra level I will also send her the results to get advice regarding dose adjustment if necessary.  Follow-up in 3 weeks or as needed.

## 2017-09-26 NOTE — Telephone Encounter (Signed)
Ok to give [facility] order [facility].

## 2017-09-28 LAB — LEVETIRACETAM LEVEL: KEPPRA (LEVETIRACETAM): 27.7 ug/mL

## 2017-09-29 ENCOUNTER — Telehealth: Payer: Self-pay | Admitting: Adult Health

## 2017-09-29 NOTE — Telephone Encounter (Signed)
Patient concerned that her BP is not improving. BP being checked at home with a digital monitor. Patient says it fit her arm but the monitor has not been checked for accuracy. BP checked at PCP on 09/25/17 and was 160/90. Patient did not have transportation for HTN clinic appointment that was scheduled on 09/22/17. Patient said she can come anytime after Tuesday next week after being cleared to drive by her surgeon.  Confirmed that she taking hydralazine 50 mg TID and last dose of clonidine 0.1 mg will be taken tonight.  HTN clinic visit rescheduled for 10/18/17. Advised to take BP monitor to her PCP office as they suggested to have it checked for accuracy and message would be sent to her provider for further instructions.

## 2017-09-29 NOTE — Telephone Encounter (Signed)
New Message  Pt c/o medication issue:  1. Name of Medication: hydrALAZINE (APRESOLINE) 50 MG tablet and cloNIDine (CATAPRES) 0.2 MG tablet  2. How are you currently taking this medication (dosage and times per day)? Take 1 tablet (50 mg total) by mouth 3 (three) times daily. And Take 1 tablet (0.2 mg total) by mouth daily  3. Are you having a reaction (difficulty breathing--STAT)? no  4. What is your medication issue? Pt states that the medication change is not working and her bp's are still running high last checked 170/89 highest 183/101. Please call

## 2017-10-01 NOTE — Telephone Encounter (Signed)
Increase hydralazine to 100 mg TID. Recheck her response to medication response as she is able. She will need to have her BP monitor correlated.

## 2017-10-02 ENCOUNTER — Other Ambulatory Visit: Payer: Self-pay | Admitting: *Deleted

## 2017-10-02 DIAGNOSIS — Z951 Presence of aortocoronary bypass graft: Secondary | ICD-10-CM

## 2017-10-02 NOTE — Telephone Encounter (Signed)
Lmvm-how is BP?

## 2017-10-02 NOTE — Telephone Encounter (Signed)
Pt called back she states that she has not taken her BP since 3-15 d/t inaccuracy. She states that on her monitor her BP registers 170-190/100's when she states it, she states that she does not "feel" like her BP is running high. She states that she has MD appt tomorrow and will bring her monitor with her to determine accuracy and she will call us back w/update

## 2017-10-03 ENCOUNTER — Ambulatory Visit (INDEPENDENT_AMBULATORY_CARE_PROVIDER_SITE_OTHER): Payer: Self-pay | Admitting: Thoracic Surgery (Cardiothoracic Vascular Surgery)

## 2017-10-03 ENCOUNTER — Ambulatory Visit
Admission: RE | Admit: 2017-10-03 | Discharge: 2017-10-03 | Disposition: A | Discharge status: Home / Self Care | Payer: 59 | Source: Ambulatory Visit | Attending: Thoracic Surgery (Cardiothoracic Vascular Surgery) | Admitting: Thoracic Surgery (Cardiothoracic Vascular Surgery)

## 2017-10-03 VITALS — BP 150/88 | HR 86 | Resp 20 | Ht 61.0 in | Wt 193.0 lb

## 2017-10-03 DIAGNOSIS — Z951 Presence of aortocoronary bypass graft: Secondary | ICD-10-CM

## 2017-10-03 NOTE — Progress Notes (Signed)
[address]       [place] [address]             [phone]     HPI: Ms. Grein returns for scheduled postoperative follow-up visit  She is a 47 year old woman with a history of obesity, hypertension, stroke, seizures, and tobacco abuse.  She presented with a non-ST elevation MI.  She had a echocardiogram that showed ejection fraction of 35%.  At catheterization she had severe three-vessel disease.  She underwent coronary bypass grafting x5 on 08/30/2017.  She was noted to have diffusely diseased poor target vessels the time of surgery.  Postoperatively she did well.  Blood pressure control was an issue, but otherwise her course was uncomplicated.  She feels well.  She only took oxycodone 1 time after leaving the hospital.  She is not taking anything for pain at this point.  She has not had any recurrent angina.  She is anxious to start cardiac rehab.  Past Medical History:  Diagnosis Date  . Anemia   . CVA (cerebral infarction)    right side numbness  . Diabetes mellitus   . Hypertension   . Seizures ([place])   . Stroke [facility])     Current Outpatient Medications  Medication Sig Dispense Refill  . amLODipine (NORVASC) 10 MG tablet Take 10 mg by mouth daily.    Marland Kitchen aspirin 325 MG tablet Take 1 tablet (325 mg total) by mouth daily.    Marland Kitchen atorvastatin (LIPITOR) 80 MG tablet Take 1 tablet (80 mg total) by mouth daily at 6 PM. 30 tablet 1  . blood glucose meter kit and supplies KIT Dispense based on patient and insurance preference. Use up to four times daily as directed. (FOR ICD-9 250.00, 250.01). 1 each 0  . calcium carbonate (TUMS EX) 750 MG chewable tablet Chew 2 tablets by mouth as needed for heartburn.    . ferrous sulfate 325 (65 FE) MG tablet Take 1 tablet (325 mg total) by mouth daily with breakfast. 30 tablet 1  . glipiZIDE (GLUCOTROL XL) 10 MG 24 hr tablet Take 2 tablets (20 mg total) by mouth daily. 60 tablet 1  . glucose monitoring kit (FREESTYLE)  monitoring kit 1 each by Does not apply route as needed for other. 1 each 0  . hydrALAZINE (APRESOLINE) 50 MG tablet Take 1 tablet (50 mg total) by mouth 3 (three) times daily. 90 tablet 5  . levETIRAcetam (KEPPRA) 750 MG tablet Take 1 tablet (750 mg total) by mouth 2 (two) times daily. 60 tablet 11  . lidocaine (XYLOCAINE) 2 % solution Use as directed 15 mLs in the mouth or throat as needed for mouth pain. 100 mL 0  . losartan (COZAAR) 100 MG tablet Take 1 tablet (100 mg total) by mouth daily. 30 tablet 1  . metFORMIN (GLUCOPHAGE) 500 MG tablet Take 1 tablet (500 mg total) by mouth 2 (two) times daily with a meal. 180 tablet 3  . metoprolol tartrate (LOPRESSOR) 50 MG tablet Take 1 tablet (50 mg total) by mouth 2 (two) times daily. 60 tablet 5  . Potassium Citrate 15 MEQ (1620 MG) TBCR     . furosemide (LASIX) 40 MG tablet Take 1 tablet (40 mg total) by mouth daily. (Patient not taking: Reported on 10/03/2017) 30 tablet 5  . potassium chloride SA (KLOR-CON M15) 15 MEQ tablet Take 2 tablets (30 mEq total) by mouth daily. (Patient not taking: Reported on 10/03/2017) 60 tablet 0  No current facility-administered medications for this visit.     Physical Exam BP (!) 150/88   Pulse 86   Resp 20   Ht 5' 1"  (1.549 m)   Wt 193 lb (87.5 kg)   SpO2 99% Comment: RA  BMI 36.72 kg/m  47 year old woman in no acute distress Alert and oriented x3 with no focal deficits Lungs clear with equal breath sounds bilaterally Cardiac regular rate and rhythm normal S1-S2 Sternum stable, incision healing well Leg incisions healing well, no peripheral edema  Diagnostic Tests: CHEST - 2 VIEW  COMPARISON:  Chest radiograph 09/11/2017  FINDINGS: Improved aeration of the left lung with minimal residual left basilar opacity and small amount of fluid in the left major fissure. Sequelae of median sternotomy for CABG. Normal heart size. No pneumothorax.  IMPRESSION: Improved aeration of the left lung with  mild residual left basilar opacity.   Electronically Signed   By: Ulyses Jarred M.D.   On: 10/03/2017 13:32 I personally reviewed the chest x-ray images and concur with the findings noted above  Impression: Ms. Medine is a 47 year old woman with multiple cardiac risk factors who presented with a non-ST elevation MI.  She underwent coronary bypass grafting x5 on 08/30/2017.  She is doing very well from a surgical standpoint.  She is not having any incisional pain and has not had any recurrent angina.  Her exercise tolerance is improving.  She is anxious to start cardiac rehab and increase her physical activity.  She may begin driving.  Appropriate precautions were discussed.  She is not to lift anything over 10 pounds for another 2 weeks.  Beyond that her activities are unrestricted but she was cautioned to build into new activities gradually.  She has not yet seen Dr. Stanford Breed since discharge from the hospital.  Hypertension-blood pressure elevated today.  She says this is the best that has been as far back as she can remember.  Will need continued follow-up and adjustments.  She wants to stop Lasix.  We will stop the Lasix and potassium.  She will weigh herself every day.  If she notices a gain of 3 pounds in a day or 5 pounds in 3 days she will resume the Lasix and potassium.  Plan: I will be happy to see her back at any time if I can be of any further assistance with her care  Melrose Nakayama, MD [facility] [phone]

## 2017-10-03 NOTE — Patient Instructions (Signed)
Stop lasix and potassium  If you gain more 3 pounds in a day or 5 pounds in 3 days- resume lasix and potassium

## 2017-10-10 NOTE — Telephone Encounter (Signed)
Per OV note from 10-03-17 Dr Dorris FetchHendrickson appt: BP 150/88  Pulse 86.  Returned call to pt she [facility] she did verify that the numbers are accurate.  Today BP: 196/110-before medication Yesterday:189/89 before med  183/99~noon Pt has appt 10-18-17 w/[facility] and will discuss after this visit. She states that she will bring her BP monitor to check accuracy at that appt.   KL notified of this message

## 2017-10-16 ENCOUNTER — Encounter: Payer: Self-pay | Admitting: Registered"

## 2017-10-16 ENCOUNTER — Encounter: Payer: 59 | Attending: Medical | Admitting: Registered"

## 2017-10-16 DIAGNOSIS — Z713 Dietary counseling and surveillance: Secondary | ICD-10-CM | POA: Insufficient documentation

## 2017-10-16 DIAGNOSIS — E118 Type 2 diabetes mellitus with unspecified complications: Secondary | ICD-10-CM

## 2017-10-16 DIAGNOSIS — E1165 Type 2 diabetes mellitus with hyperglycemia: Secondary | ICD-10-CM | POA: Diagnosis not present

## 2017-10-16 NOTE — Patient Instructions (Addendum)
Call your insurance company to see if they will pay for the Freestyle CGM. Your last a1c 10.6% in Epic August 29, 2017 Consider making another appointment with your primary care provider to re-evaluate your medication due to low blood sugar. Treat your low blood sugar with 15 grams of fast acting carbohydrate Aim to eat balanced meals and snacks, including a protein whenever you have a carbohydrate.

## 2017-10-16 NOTE — Progress Notes (Signed)
Diabetes Self-Management Education  Visit Type: First/Initial  Appt. Start Time: 1130 Appt. End Time: 1300  10/16/2017  [name], identified by name and date of birth, is a 47 y.o. female with a diagnosis of Diabetes: Type 2.   ASSESSMENT Patient states she has become very serious about self-care since her open heart surgery. Pt states she had already started making changes and lost weight before her surgery. Patient states she was in denial for many years that the diabetes was anything to worry about because of her misconception that T2DM was not as bad as T1DM and wasn't concerned about eating a lot of sweets, regular soda and doughnuts. Patient states she has cut all sweets and when her family or friends try to push her favorite sweets on her, she may have a bite but limits to minimal amounts.   Patient reports concern about frequent low blood sugar gets in low 60s and patient reports getting very tired. Patient states she is taking glipizide in the morning and metformin 2x day. Ptatient states if her FBG is low, in the 60s mg/dL, she will skip a metformin dose. Patient states her fingers are getting sore from testing. Patient states she would like to try a CGM.  Patient states she also thinks her fatigue is also due to low iron. Patient states it has been several months since she has taken her supplement because she couldn't find it, but has it now and will start tomorrow. RD reviewed food sources for vitamin C to help with absorption of iron.   Patient states one of her daughters has one kidney and stage 2 CKD and met with a dietitian to control sodium in her diet. Patient states she they are supporting each other in trying to manage their health with dietary choices.  Patient states she enjoys [place] food and also enjoys visiting [place] and wants to know how she might be able to enjoy the food there and also manage her blood sugar. RD will review next visit.    Patient states  that her work provides a gym that she will be able to use once she goes back to work (mid-May?). Patient states soon she will be starting cardiac rehab 3x week.  Patient has had an A1c over 10% for many years. In the assessment form patient indicated she is not checking her feet. RD may cover foot care at the next visit if time. Patient also expressed interest in how diabetes causes heart disease which we had enough time to just touch on the basics of chronic conditions.   Diabetes Self-Management Education - 10/16/17 1151      Visit Information   Visit Type  First/Initial      Initial Visit   Diabetes Type  Type 2    Are you currently following a meal plan?  Yes    What type of meal plan do you follow?  low carb/sodium    Are you taking your medications as prescribed?  Yes glipizide, metformin    Date Diagnosed  [place]   How would you rate your overall health?  Fair      Psychosocial Assessment   Patient Belief/Attitude about Diabetes  Motivated to manage diabetes    How often do you need to have someone help you when you read instructions, pamphlets, or other written materials from your doctor or pharmacy?  1 - Never    What is the last grade level you  completed in school?  college      Complications   Last HgB A1C per patient/outside source  10.6 %    How often do you check your blood sugar?  1-2 times/day    Fasting Blood glucose range (mg/dL)  70-129;<70 60-125    Postprandial Blood glucose range (mg/dL)  -- 129-130 once she saw 250 once after a salad    Number of hypoglycemic episodes per month  5    Can you tell when your blood sugar is low?  Yes    What do you do if your blood sugar is low?  ate cinamon toast crunch w 2% milk    Number of hyperglycemic episodes per week  1    Can you tell when your blood sugar is high?  No    Have you had a dilated eye exam in the past 12 months?  No    Have you had a dental exam in the past 12 months?  Yes    Are you  checking your feet?  No      Dietary Intake   Breakfast  cereal OR waffle, sugar-free syrup OR protein shake only if BG high enough OR greek yogurt with berries, granola OR omlette    Snack (morning)  apples OR pears OR grapes    Lunch  cereal    Snack (afternoon)  fruit    Dinner  salmon, broccoli cheese, couple of bites of sweet potato    Snack (evening)  fruit OR cereal    Beverage(s)  water, coffee, vanilla soy milk 2x week, diet soda      Exercise   Exercise Type  ADL's    How many days per week to you exercise?  0    How many minutes per day do you exercise?  0    Total minutes per week of exercise  0      Patient Education   Previous Diabetes Education  Yes (please comment) over 10 yrs    Nutrition management   Role of diet in the treatment of diabetes and the relationship between the three main macronutrients and blood glucose level    Physical activity and exercise   Identified with patient nutritional and/or medication changes necessary with exercise.    Acute complications  Taught treatment of hypoglycemia - the 15 rule.    Chronic complications  Lipid levels, blood glucose control and heart disease      Individualized Goals (developed by patient)   Nutrition  General guidelines for healthy choices and portions discussed    Physical Activity  Exercise 3-5 times per week    Monitoring   test my blood glucose as discussed    Reducing Risk  treat hypoglycemia with 15 grams of carbs if blood glucose less than 48m/dL      Outcomes   Expected Outcomes  Demonstrated interest in learning. Expect positive outcomes    Future DMSE  4-6 wks    Program Status  Not Completed      Individualized Plan for Diabetes Self-Management Training:   Learning Objective:  Patient will have a greater understanding of diabetes self-management. Patient education plan is to attend individual and/or group sessions per assessed needs and concerns.  Patient Instructions  Call your insurance  company to see if they will pay for the Freestyle CGM. Your last a1c 10.6% in Epic August 29, 2017 Consider making another appointment with your primary care provider to re-evaluate your medication due to low blood sugar.  Treat your low blood sugar with 15 grams of fast acting carbohydrate Aim to eat balanced meals and snacks, including a protein whenever you have a carbohydrate.  Expected Outcomes:  Demonstrated interest in learning. Expect positive outcomes  Education material provided: ADA Diabetes book, Medications, and My Plate  If problems or questions, patient to contact team via:  Phone and Mychart  Future DSME appointment: 4-6 wks

## 2017-10-18 ENCOUNTER — Ambulatory Visit: Payer: 59 | Admitting: Pharmacist Clinician (PhC)/ Clinical Pharmacy Specialist

## 2017-10-18 ENCOUNTER — Encounter: Payer: Self-pay | Admitting: Pharmacist Clinician (PhC)/ Clinical Pharmacy Specialist

## 2017-10-18 ENCOUNTER — Telehealth: Payer: Self-pay | Admitting: Cardiology

## 2017-10-18 VITALS — BP 170/82 | HR 84

## 2017-10-18 DIAGNOSIS — I10 Essential (primary) hypertension: Secondary | ICD-10-CM

## 2017-10-18 MED ORDER — CHLORTHALIDONE 25 MG PO TABS: 25.0000 mg | ORAL_TABLET | Freq: Every day | ORAL | 5 refills | Status: AC

## 2017-10-18 MED ORDER — CHLORTHALIDONE 25 MG PO TABS: 25.0000 mg | ORAL_TABLET | Freq: Every day | ORAL | 5 refills | Status: DC

## 2017-10-18 NOTE — Telephone Encounter (Signed)
[facility] called requesting information about the Chlorthalidone 25 mg daily. The prescription stated 3 tablets. Per pharmd, it should read 30 tablets with 5 refills. The prescription has been changed.

## 2017-10-18 NOTE — Progress Notes (Signed)
10/19/2017 Sharion Dove July 28, 1970 [id number]   HPI:  Michele [name] is a 47 y.o. female patient of Dr Stanford Breed, with a PMH below who presents today for hypertension clinic evaluation.  In addition to hypertension, her medical history is significant for nSTEMI, CVA, seizure disorder, diabetes (A1c 10.6), anemia, prior stroke and tobacco abuse.  On Feb 10 of this year she was admitted to [facility] hospital and underwent a CABG x 5.  Echo at that time showed an EF of 35-40%.    She was prescribed hydralazine three times daily as she weaned off the clonidine, however patient admits that getting a mid-day dose is almost impossible to remember.    Today in the office patient notes that her home BP is always > 762 systolic and not uncommonly > 831 diastolic.   She has no exact readings or machine with her today.   She admits to a very stressful home life, with a 32 year old daughter who has anger issues, to the point where patient has previously had to call the police or leave the house for 24+ hours.  She is currently separated from her husband (not the girls father) because the daughter does not like him around.    Blood Pressure Goal:  130/80  Current Medications:  Amlodipine 10 mg daily - am  Hydralazine 100 mg bid  Losartan 100 mg daily - am  Metoprolol 50 mg bid  Family Hx:  Father died in 50s from cancer  Mother - has lupus, RA, DM  Sister with chronic swollen feet, but won't go to MD  3 kids, 1 with CKD, Klippel Fiel syndrome, scoliosis, born with 1 kidney (now 82); another daughter (now 76) deaf in 1 ear, other daughter (twin to 25 year old) healthy  Social Hx:  Quit tobacco Feb 10, no withdrawal symptoms; no recent alcohol, enjoys social glass of wine; had cut back on coffee, now not daily; occasional soda  Diet:  Mostly home cooked meals; no added salt at home;  Exercise:  Cardiac rehab starts Tuesday  Home BP readings:  Did not bring home cuff, states systolic rarely < 517,  diastolic mostly 61Y with a few > 100  Home cuff brand new about 3 weeks, got from [facility], asked for large cuff   Intolerances:   NKDA  Labs:  09/25/17:  Na 138, K 3.6, Glu 135, BUN 11, SCr 1.04  08/29/17:  A1c 10.6  (down from 11.4 in Aug 18)  Wt Readings from Last 3 Encounters:  10/03/17 193 lb (87.5 kg)  09/25/17 193 lb (87.5 kg)  09/18/17 196 lb 9.6 oz (89.2 kg)   BP Readings from Last 3 Encounters:  10/18/17 (!) 170/82  10/03/17 (!) 150/88  09/25/17 (!) 160/90   Pulse Readings from Last 3 Encounters:  10/18/17 84  10/03/17 86  09/25/17 77    Current Outpatient Medications  Medication Sig Dispense Refill  . amLODipine (NORVASC) 10 MG tablet Take 10 mg by mouth daily.    Marland Kitchen aspirin 325 MG tablet Take 1 tablet (325 mg total) by mouth daily.    Marland Kitchen atorvastatin (LIPITOR) 80 MG tablet Take 1 tablet (80 mg total) by mouth daily at 6 PM. 30 tablet 1  . blood glucose meter kit and supplies KIT Dispense based on patient and insurance preference. Use up to four times daily as directed. (FOR ICD-9 250.00, 250.01). 1 each 0  . calcium carbonate (TUMS EX) 750 MG chewable tablet Chew 2 tablets by  mouth as needed for heartburn.    . chlorthalidone (HYGROTON) 25 MG tablet Take 1 tablet (25 mg total) by mouth daily. 30 tablet 5  . ferrous sulfate 325 (65 FE) MG tablet Take 1 tablet (325 mg total) by mouth daily with breakfast. 30 tablet 1  . furosemide (LASIX) 40 MG tablet Take 1 tablet (40 mg total) by mouth daily. (Patient not taking: Reported on 10/03/2017) 30 tablet 5  . glipiZIDE (GLUCOTROL XL) 10 MG 24 hr tablet Take 2 tablets (20 mg total) by mouth daily. 60 tablet 1  . glucose monitoring kit (FREESTYLE) monitoring kit 1 each by Does not apply route as needed for other. 1 each 0  . hydrALAZINE (APRESOLINE) 50 MG tablet Take 1 tablet (50 mg total) by mouth 3 (three) times daily. (Patient taking differently: Take 100 mg by mouth 2 (two) times daily. ) 90 tablet 5  . levETIRAcetam (KEPPRA) 750  MG tablet Take 1 tablet (750 mg total) by mouth 2 (two) times daily. 60 tablet 11  . lidocaine (XYLOCAINE) 2 % solution Use as directed 15 mLs in the mouth or throat as needed for mouth pain. (Patient not taking: Reported on 10/16/2017) 100 mL 0  . losartan (COZAAR) 100 MG tablet Take 1 tablet (100 mg total) by mouth daily. 30 tablet 1  . metFORMIN (GLUCOPHAGE) 500 MG tablet Take 1 tablet (500 mg total) by mouth 2 (two) times daily with a meal. 180 tablet 3  . metoprolol tartrate (LOPRESSOR) 50 MG tablet Take 1 tablet (50 mg total) by mouth 2 (two) times daily. 60 tablet 5  . potassium chloride SA (KLOR-CON M15) 15 MEQ tablet Take 2 tablets (30 mEq total) by mouth daily. (Patient not taking: Reported on 10/03/2017) 60 tablet 0  . Potassium Citrate 15 MEQ (1620 MG) TBCR      No current facility-administered medications for this visit.     No Known Allergies  Past Medical History:  Diagnosis Date  . Anemia   . CVA (cerebral infarction)    right side numbness  . Diabetes mellitus   . Hypertension   . Seizures ([place])   . Stroke ([place])     Blood pressure (!) 170/82, pulse 84.  Right arm 166/90, standing 162/88  HTN (hypertension) Patient with hypertension, probably multiple mechanisms.   She knows the stress plays a part and she is working to get her daughter help in a juvenile facility.  She has weaned off the clonidine and now takes losartan, amlodipine, hydralazine and metoprolol.  For now, as her CrCl is good, I will start her on chlorthalidone 25 mg daily in the mornings and move her amlodipine to evenings.   She should continue with home BP checks 1-2 times per day and was given instructions on proper technique.  She will bring her home cuff and list of readings to next appointment.   We may need to consider switching the metoprolol to carvedilol for better BP control, and also consider spironolactone.  I would also recommend to her PCP to start her on Jardiance, to further reduce her risk of  CV events.     Tommy Medal PharmD CPP [facility] [address] [place], [place] [address] [phone]

## 2017-10-18 NOTE — Patient Instructions (Addendum)
Return for a a follow up appointment in 2 weeks  Your blood pressure today is 170/82   Check your blood pressure at home twice daily and keep record of the readings.  Take your BP meds as follows:  AM:  Hydralazine 100 mg (2 x 50 mg tabs)          Losartan 100 mg           Metoprolol 50 mg          Chlorthalidone 25 mg   PM: Amlodipine 10 mg         Hydralazine 100 mg (2 x 50 mg tabs)         Metoprolol 50 mg  Bring all of your meds, your BP cuff and your record of home blood pressures to your next appointment.  Exercise as you're able, try to walk approximately 30 minutes per day.  Keep salt intake to a minimum, especially watch canned and prepared boxed foods.  Eat more fresh fruits and vegetables and fewer canned items.  Avoid eating in fast food restaurants.    HOW TO TAKE YOUR BLOOD PRESSURE: . Rest 5 minutes before taking your blood pressure. .  Don't smoke or drink caffeinated beverages for at least 30 minutes before. . Take your blood pressure before (not after) you eat. . Sit comfortably with your back supported and both feet on the floor (don't cross your legs). . Elevate your arm to heart level on a table or a desk. . Use the proper sized cuff. It should fit smoothly and snugly around your bare upper arm. There should be enough room to slip a fingertip under the cuff. The bottom edge of the cuff should be 1 inch above the crease of the elbow. . Ideally, take 3 measurements at one sitting and record the average.

## 2017-10-18 NOTE — Telephone Encounter (Signed)
New message   Pt c/o medication issue:  1. Name of Medication: chlorthalidone (HYGROTON) 25 MG tablet  2. How are you currently taking this medication (dosage and times per day)? Take 1 tablet (25 mg total) by mouth daily.  3. Are you having a reaction (difficulty breathing--STAT)? No   4. What is your medication issue? Please clarify amount to dispense

## 2017-10-19 NOTE — Assessment & Plan Note (Signed)
Patient with hypertension, probably multiple mechanisms.   She knows the stress plays a part and she is working to get her daughter help in a juvenile facility.  She has weaned off the clonidine and now takes losartan, amlodipine, hydralazine and metoprolol.  For now, as her CrCl is good, I will start her on chlorthalidone 25 mg daily in the mornings and move her amlodipine to evenings.   She should continue with home BP checks 1-2 times per day and was given instructions on proper technique.  She will bring her home cuff and list of readings to next appointment.   We may need to consider switching the metoprolol to carvedilol for better BP control, and also consider spironolactone.  I would also recommend to her PCP to start her on Jardiance, to further reduce her risk of CV events.

## 2017-10-29 ENCOUNTER — Other Ambulatory Visit: Payer: Self-pay | Admitting: Physician Assistant

## 2017-10-31 ENCOUNTER — Other Ambulatory Visit: Payer: Self-pay

## 2017-10-31 MED ORDER — GLIPIZIDE ER 10 MG PO TB24: 20.0000 mg | ORAL_TABLET | Freq: Every day | ORAL | 1 refills | Status: DC

## 2017-10-31 NOTE — Progress Notes (Deleted)
HPI:  Michele [name] is a 47 y.o. female patient of Dr Stanford Breed, with a PMH below who presents today for hypertension clinic evaluation.  In addition to hypertension, her medical history is significant for nSTEMI, CVA, seizure disorder, diabetes (A1c 10.6), anemia, prior stroke and tobacco abuse.  On Feb 10 of this year she was admitted to [facility] hospital and underwent a CABG x 5.  Echo at that time showed an EF of 35-40%.    She was prescribed hydralazine three times daily as she weaned off the clonidine, however patient admits that getting a mid-day dose is almost impossible to remember.    Today in the office patient notes that her home BP is always > 213 systolic and not uncommonly > 086 diastolic.   She has no exact readings or machine with her today.   She admits to a very stressful home life, with a 34 year old daughter who has anger issues, to the point where patient has previously had to call the police or leave the house for 24+ hours.  She is currently separated from her husband (not the girls father) because the daughter does not like him around.    (BMET today??)  Blood Pressure Goal:  130/80  Current Medications:  Amlodipine 10 mg daily - pm  Hydralazine 100 mg bid  Losartan 100 mg daily - am  Metoprolol 50 mg bid  Chorthalidone 36m daily  Family History:  Father died in 332sfrom cancer  Mother - has lupus, RA, DM  Sister with chronic swollen feet, but won't go to MD  3 kids, 1 with CKD, Klippel Fiel syndrome, scoliosis, born with 1 kidney (now 180; another daughter (now 147 deaf in 1 ear, other daughter (twin to 128year old) healthy  Social History:  Quit tobacco Feb 10, no withdrawal symptoms; no recent alcohol, enjoys social glass of wine; had cut back on coffee, now not daily; occasional soda  Diet:  Mostly home cooked meals; no added salt at home;  Exercise:  Cardiac rehab starts Tuesday  Home BP readings:  Did not bring home cuff, states systolic rarely < 1578  diastolic mostly 946Nwith a few > 100  Home cuff brand new about 3 weeks, [facility]   Labs:  09/25/17:  Na 138, K 3.6, Glu 135, BUN 11, SCr 1.04  08/29/17:  A1c 10.6  (down from 11.4 in Aug 18)  Wt Readings from Last 3 Encounters:  10/03/17 193 lb (87.5 kg)  09/25/17 193 lb (87.5 kg)  09/18/17 196 lb 9.6 oz (89.2 kg)   BP Readings from Last 3 Encounters:  10/18/17 (!) 170/82  10/03/17 (!) 150/88  09/25/17 (!) 160/90   Pulse Readings from Last 3 Encounters:  10/18/17 84  10/03/17 86  09/25/17 77    Current Outpatient Medications  Medication Sig Dispense Refill  . amLODipine (NORVASC) 10 MG tablet Take 10 mg by mouth daily.    .Marland Kitchenaspirin 325 MG tablet Take 1 tablet (325 mg total) by mouth daily.    .Marland Kitchenatorvastatin (LIPITOR) 80 MG tablet Take 1 tablet (80 mg total) by mouth daily at 6 PM. 30 tablet 1  . blood glucose meter kit and supplies KIT Dispense based on patient and insurance preference. Use up to four times daily as directed. (FOR ICD-9 250.00, 250.01). 1 each 0  . calcium carbonate (TUMS EX) 750 MG chewable tablet Chew 2 tablets by mouth as needed for heartburn.    .Marland Kitchen  chlorthalidone (HYGROTON) 25 MG tablet Take 1 tablet (25 mg total) by mouth daily. 30 tablet 5  . ferrous sulfate 325 (65 FE) MG tablet Take 1 tablet (325 mg total) by mouth daily with breakfast. 30 tablet 1  . furosemide (LASIX) 40 MG tablet Take 1 tablet (40 mg total) by mouth daily. (Patient not taking: Reported on 10/03/2017) 30 tablet 5  . glipiZIDE (GLUCOTROL XL) 10 MG 24 hr tablet Take 2 tablets (20 mg total) by mouth daily. 60 tablet 1  . glucose monitoring kit (FREESTYLE) monitoring kit 1 each by Does not apply route as needed for other. 1 each 0  . hydrALAZINE (APRESOLINE) 50 MG tablet Take 1 tablet (50 mg total) by mouth 3 (three) times daily. (Patient taking differently: Take 100 mg by mouth 2 (two) times daily. ) 90 tablet 5  . levETIRAcetam (KEPPRA) 750 MG tablet Take 1 tablet  (750 mg total) by mouth 2 (two) times daily. 60 tablet 11  . lidocaine (XYLOCAINE) 2 % solution Use as directed 15 mLs in the mouth or throat as needed for mouth pain. (Patient not taking: Reported on 10/16/2017) 100 mL 0  . losartan (COZAAR) 100 MG tablet Take 1 tablet (100 mg total) by mouth daily. 30 tablet 1  . metFORMIN (GLUCOPHAGE) 500 MG tablet Take 1 tablet (500 mg total) by mouth 2 (two) times daily with a meal. 180 tablet 3  . metoprolol tartrate (LOPRESSOR) 50 MG tablet Take 1 tablet (50 mg total) by mouth 2 (two) times daily. 60 tablet 5  . potassium chloride SA (KLOR-CON M15) 15 MEQ tablet Take 2 tablets (30 mEq total) by mouth daily. (Patient not taking: Reported on 10/03/2017) 60 tablet 0  . Potassium Citrate 15 MEQ (1620 MG) TBCR      No current facility-administered medications for this visit.     No Known Allergies  Past Medical History:  Diagnosis Date  . Anemia   . CVA (cerebral infarction)    right side numbness  . Diabetes mellitus   . Hypertension   . Seizures ([place])   . Stroke [facility])     There were no vitals taken for this visit.    No problem-specific Assessment & Plan notes found for this encounter.   Zaccary Creech Rodriguez-Guzman PharmD, BCPS, [place] [address] [place] [address] 10/31/2017 4:31 PM

## 2017-11-01 ENCOUNTER — Ambulatory Visit: Payer: 59

## 2017-11-07 ENCOUNTER — Telehealth: Payer: Self-pay | Admitting: Medical

## 2017-11-07 MED ORDER — LOSARTAN POTASSIUM 100 MG PO TABS: 100.0000 mg | ORAL_TABLET | Freq: Every day | ORAL | 5 refills | Status: AC

## 2017-11-07 MED ORDER — GLIPIZIDE ER 10 MG PO TB24: 20.0000 mg | ORAL_TABLET | Freq: Every day | ORAL | 1 refills | Status: DC

## 2017-11-07 MED ORDER — LEVETIRACETAM 1000 MG PO TABS: 1000.0000 mg | ORAL_TABLET | Freq: Two times a day (BID) | ORAL | 11 refills | Status: AC

## 2017-11-07 NOTE — Telephone Encounter (Signed)
Medications filled in previous encounter on 11/07/17

## 2017-11-07 NOTE — Telephone Encounter (Signed)
Refill of keppra at 1000 mg dose twice daily.

## 2017-11-07 NOTE — Telephone Encounter (Signed)
Opened to send in new rx

## 2017-11-07 NOTE — Telephone Encounter (Signed)
Copied from CRM [id number]. Topic: Inquiry >> Nov 07, 2017  9:20 AM Yvonna Alanisobinson, Andra M wrote: Reason for CRM: Natalia LeatherwoodKatherine from [facility] [phone]) called requesting refills for the following medications for this patient: GlipiZIDE (GLUCOTROL XL) 10 MG 24 hr tablet and Losartan (COZAAR) 100 MG tablet. Patient's preferred pharmacy is [facility] [address] [place], [place] - [address] [phone] (Phone) [phone] (Fax).       Thank You!!!

## 2017-11-07 NOTE — Telephone Encounter (Signed)
Copied from CRM [id number]. Topic: Quick Communication - Rx Refill/Question >> Nov 07, 2017  3:27 PM Maia Pettiesrtiz, Kristie S wrote: Medication: levETIRAcetam (KEPPRA) - pt states that [facility] that dose needs increased to 1000mg  - pt states that Ramon DredgeEdward and Dr. Lucia GaskinsAhern had communicated about this dose change. Pt states "one of them needs to order it" stating she had a seizure recently (approximately 6 weeks ago). Pt will be out of medication tomorrow. Please advise patient.  Has the patient contacted their pharmacy? No - needs new RX for dose change Preferred Pharmacy (with phone number or street name): [facility] [address] - [place], [place] - [address] [phone] (Phone) [phone] (Fax)  our pharmacy.

## 2017-11-07 NOTE — Telephone Encounter (Signed)
I did send message to the neurologist and need to find out if patient had missed any Keppra dose at all prior to her recent seizure. If she has missed even one dose then that is the reason for the breakthrough and I would tell her not to miss her medication again or risk seizure. If she has been compliant and still having breakthrough like this then I would go up to 1000mg  Keppra twice daily. let me know what patient says.    Regarding pt keppra dose. If she has been compliant on Keppra 750 twice day and still had seizure as she reports then will go ahead and rx higher dose. I never got answer to the above result note question.   Will go ahead and sent new rx to pt pharmacy. Also have her follow up in one month. I want to get a1c. I refilled her glucotrol but want to get a1c to review blood sugar levels. Might need to cut back on glucotrol if sugar tightly controlled.

## 2017-11-09 ENCOUNTER — Other Ambulatory Visit: Payer: Self-pay

## 2017-11-09 MED ORDER — GLIPIZIDE ER 10 MG PO TB24: 20.0000 mg | ORAL_TABLET | Freq: Every day | ORAL | 2 refills | Status: AC

## 2017-11-09 NOTE — Telephone Encounter (Signed)
Patient states she does not remember if she forgot a dose or not. She states she takes it twice a day but is unsure if she missed it recently. She ran out of the 750mg  today but has already picked up the 1000mg  keppra. Please advise on what does she needs to take. She has not had any more seizures or complaints since previous episode. I have scheduled her for a one month follow up with you on May 7th. She states she needed an appointment before the 14th.

## 2017-11-09 NOTE — Telephone Encounter (Signed)
Go ahead and start 1000 mg twice daily. This would be advised plan if she did not miss doses.

## 2017-11-09 NOTE — Telephone Encounter (Signed)
Patient was notified and verbalized understanding. She states she loves her doctor so much and you are the best!

## 2017-11-13 ENCOUNTER — Ambulatory Visit: Payer: 59 | Admitting: Registered"

## 2017-11-16 ENCOUNTER — Encounter: Payer: 59 | Attending: Medical | Admitting: Registered"

## 2017-11-16 DIAGNOSIS — E11649 Type 2 diabetes mellitus with hypoglycemia without coma: Secondary | ICD-10-CM

## 2017-11-16 DIAGNOSIS — Z713 Dietary counseling and surveillance: Secondary | ICD-10-CM | POA: Insufficient documentation

## 2017-11-16 DIAGNOSIS — E1165 Type 2 diabetes mellitus with hyperglycemia: Secondary | ICD-10-CM | POA: Insufficient documentation

## 2017-11-16 NOTE — Progress Notes (Signed)
Diabetes Self-Management Education  Visit Type: Follow-up  Appt. Start Time: 1415 Appt. End Time: 1445  11/16/2017  [name], identified by name and date of birth, is a 47 y.o. female with a diagnosis of Diabetes: Type 2.   ASSESSMENT Patient states her FBG has not gone over 115 recently. Patient states she had a doughnut (rare occasion) and 2 hours later her BG was in the 130s. Patient states she feels better knowing she can have an occasional treat and it won't throw her BG completely off and feels she can stick to her regular meal plan. Pt states her next A1c will be done next week.  Patient reports having low blood sugar on occasion now, sometimes at the gym. Patient is enjoying her Cardiac Rehab exercises 3x per week and states she is going to try to get an appeal to get approved for the entire course. Patient states she will be returning to work on the May 14 and having to work out details of how to keep 40 hrs per week and still go to Rehab 3x week.  Patient was very excited to see some of her favorite [place] foods on the exchange list RD provided and states she will be using it when she visits [place] in the near future.  Diabetes Self-Management Education - 11/16/17 1400      Visit Information   Visit Type  Follow-up      Initial Visit   Diabetes Type  Type 2      Complications   How often do you check your blood sugar?  1-2 times/day    Fasting Blood glucose range (mg/dL)  [phone]      Dietary Intake   Breakfast  peach greek yogurt w granola    Snack (morning)  apple / grapes    Lunch  Baked chicken, [place] peas corn, rice coffee    Snack (afternoon)  fruit    Dinner  tuna salad on crackers    Snack (evening)  carbsmart ice cream, cereal    Beverage(s)  water, diet soda      Exercise   Exercise Type  Moderate (swimming / aerobic walking)    How many days per week to you exercise?  3    How many minutes per day do you exercise?  30    Total minutes per  week of exercise  90      Patient Education   Nutrition management   Role of diet in the treatment of diabetes and the relationship between the three main macronutrients and blood glucose level;Information on hints to eating out and maintain blood glucose control.      Individualized Goals (developed by patient)   Nutrition  General guidelines for healthy choices and portions discussed    Physical Activity  Exercise 3-5 times per week    Monitoring   test my blood glucose as discussed      Outcomes   Expected Outcomes  Demonstrated interest in learning. Expect positive outcomes    Future DMSE  PRN    Program Status  Completed      Subsequent Visit   Since your last visit have you continued or begun to take your medications as prescribed?  Yes    Since your last visit, are you checking your blood glucose at least once a day?  Yes      Individualized Plan for Diabetes Self-Management Training:   Learning Objective:  Patient will have a greater understanding of diabetes  self-management. Patient education plan is to attend individual and/or group sessions per assessed needs and concerns.  Patient Instructions  Consider having protein when you have fruit for a snack Continue trying new vegetables and aim to get in some everyday Use the article with exchange lists to find [place] foods to know how much carbohydrates in some of your favorite foods Continue to get in 3 days per week of exercise  Expected Outcomes:  Demonstrated interest in learning. Expect positive outcomes  Education material provided: ADA Diabetes book, Medications, and My Plate  If problems or questions, patient to contact team via:  Phone and Mychart  Future DSME appointment: PRN

## 2017-11-16 NOTE — Patient Instructions (Addendum)
Consider having protein when you have fruit for a snack Continue trying new vegetables and aim to get in some everyday Use the article with exchange lists to find [place] foods to know how much carbohydrates in some of your favorite foods Continue to get in 3 days per week of exercise

## 2017-11-21 ENCOUNTER — Ambulatory Visit: Payer: 59 | Admitting: Medical

## 2017-11-23 ENCOUNTER — Ambulatory Visit (INDEPENDENT_AMBULATORY_CARE_PROVIDER_SITE_OTHER): Payer: 59 | Admitting: Medical

## 2017-11-23 ENCOUNTER — Encounter: Payer: Self-pay | Admitting: Medical

## 2017-11-23 VITALS — BP 165/80 | HR 82 | Temp 98.4°F | Resp 16 | Ht 61.0 in | Wt 197.0 lb

## 2017-11-23 DIAGNOSIS — E876 Hypokalemia: Secondary | ICD-10-CM

## 2017-11-23 DIAGNOSIS — E785 Hyperlipidemia, unspecified: Secondary | ICD-10-CM | POA: Diagnosis not present

## 2017-11-23 DIAGNOSIS — I10 Essential (primary) hypertension: Secondary | ICD-10-CM | POA: Diagnosis not present

## 2017-11-23 DIAGNOSIS — D509 Iron deficiency anemia, unspecified: Secondary | ICD-10-CM

## 2017-11-23 DIAGNOSIS — R5383 Other fatigue: Secondary | ICD-10-CM

## 2017-11-23 DIAGNOSIS — E119 Type 2 diabetes mellitus without complications: Secondary | ICD-10-CM

## 2017-11-23 NOTE — Patient Instructions (Signed)
For your recent fatigue, I want to do lab work-up to include listed labs on sheet.  Among these will include CBC to check for anemia and iron level.  For your hypertension, I want you to add 1 extra tablet of hydralazine a day.  Continue other blood pressure medications the same.  Future fasting lipid panel and A1c placed today.  We will need to get those on Monday.  Check your blood pressure daily and see if afternoon levels are improved.  For your reported low blood sugars in the morning occasionally, I recommend that you cut back your Glucotrol to just 1 tablet a day.  If any further low blood sugar events then will discontinue it completely.  Also remember the type of foods and beverages that will increase sugar up quickly.  Follow-up in 3 weeks or as needed.

## 2017-11-23 NOTE — Progress Notes (Signed)
Subjective:    Patient ID: Michele [name], female    DOB: [birth year], 47 y.o.   MRN: [id number]  HPI  Pt in for follow up.  She states smoke free now for 90 days.  Pt is scheduled to go back this coming Tuesday.  Pt states he bp seems to go up mid day or late in the day. In morning her bp are usually controlled but in afternoon bp increases. She is on extensive med regimen for high blood pressure   Pt states low bp during day 735/670 systolic. But can increase about 20 points in any one day.   No cardiac or neurologic signs or symptoms.   Review of Systems  Constitutional: Positive for fatigue. Negative for chills.  Respiratory: Negative for cough, chest tightness, shortness of breath and wheezing.   Cardiovascular: Negative for chest pain and palpitations.  Gastrointestinal: Negative for abdominal distention, abdominal pain, constipation, diarrhea and vomiting.  Musculoskeletal: Negative for back pain.  Skin: Negative for rash.  Neurological: Negative for dizziness, seizures, syncope, weakness and light-headedness.  Hematological: Negative for adenopathy. Does not bruise/bleed easily.  Psychiatric/Behavioral: Negative for behavioral problems and confusion.    Past Medical History:  Diagnosis Date  . Anemia   . CVA (cerebral infarction)    right side numbness  . Diabetes mellitus   . Hypertension   . Seizures ([place])   . Stroke [facility]      Social History   Socioeconomic History  . Marital status: Married    Spouse name: Montine Circle  . Number of children: Not on file  . Years of education: 14  . Highest education level: Not on file  Occupational History  . Occupation: [employer]  . Financial resource strain: Not on file  . Food insecurity:    Worry: Not on file    Inability: Not on file  . Transportation needs:    Medical: Not on file    Non-medical: Not on file  Tobacco Use  . Smoking status: Former Smoker    Packs/day: 0.30   Years: 16.00    Pack years: 4.80    Last attempt to quit: 08/27/2017    Years since quitting: 0.2  . Smokeless tobacco: Never Used  Substance and Sexual Activity  . Alcohol use: Yes  . Drug use: No  . Sexual activity: Never  Lifestyle  . Physical activity:    Days per week: Not on file    Minutes per session: Not on file  . Stress: Not on file  Relationships  . Social connections:    Talks on phone: Not on file    Gets together: Not on file    Attends religious service: Not on file    Active member of club or organization: Not on file    Attends meetings of clubs or organizations: Not on file    Relationship status: Not on file  . Intimate partner violence:    Fear of current or ex partner: Not on file    Emotionally abused: Not on file    Physically abused: Not on file    Forced sexual activity: Not on file  Other Topics Concern  . Not on file  Social History Narrative   Lives with husband and kids   Right-handed   Caffeine use: large cup of coffee Mon through Fri    Past Surgical History:  Procedure Laterality Date  . btl    . CESAREAN SECTION    . CORONARY  ARTERY BYPASS GRAFT N/A 08/30/2017   Procedure: CORONARY ARTERY BYPASS GRAFTING (CABG) x 5 WITH ENDOSCOPIC HARVESTING OF RIGHT SAPHENOUS VEIN;  Surgeon: Melrose Nakayama, MD;  Location: [facility];  Service: Open Heart Surgery;  Laterality: N/A;  . KNEE ARTHROSCOPY    . LEFT HEART CATH AND CORONARY ANGIOGRAPHY N/A 08/28/2017   Procedure: LEFT HEART CATH AND CORONARY ANGIOGRAPHY;  Surgeon: Nelva Bush, MD;  Location: [place] [facility];  Service: Cardiovascular;  Laterality: N/A;  . TEE WITHOUT CARDIOVERSION N/A 08/30/2017   Procedure: TRANSESOPHAGEAL ECHOCARDIOGRAM (TEE);  Surgeon: Melrose Nakayama, MD;  Location: [facility];  Service: Open Heart Surgery;  Laterality: N/A;  . TUBAL LIGATION      Family History  Problem Relation Age of Onset  . Cancer Father   . Lupus Mother   . Diabetes Mother   . Rheum  arthritis Mother   . Heart disease Mother   . Stroke Mother   . Kidney disease Maternal Grandmother   . Heart attack Maternal Grandfather   . Seizures Neg Hx     No Known Allergies  Current Outpatient Medications on File Prior to Visit  Medication Sig Dispense Refill  . amLODipine (NORVASC) 10 MG tablet Take 10 mg by mouth daily.    Marland Kitchen aspirin 325 MG tablet Take 1 tablet (325 mg total) by mouth daily.    Marland Kitchen atorvastatin (LIPITOR) 80 MG tablet Take 1 tablet (80 mg total) by mouth daily at 6 PM. 30 tablet 1  . blood glucose meter kit and supplies KIT Dispense based on patient and insurance preference. Use up to four times daily as directed. (FOR ICD-9 250.00, 250.01). 1 each 0  . calcium carbonate (TUMS EX) 750 MG chewable tablet Chew 2 tablets by mouth as needed for heartburn.    . chlorthalidone (HYGROTON) 25 MG tablet Take 1 tablet (25 mg total) by mouth daily. 30 tablet 5  . ferrous sulfate 325 (65 FE) MG tablet Take 1 tablet (325 mg total) by mouth daily with breakfast. 30 tablet 1  . glipiZIDE (GLUCOTROL XL) 10 MG 24 hr tablet Take 2 tablets (20 mg total) by mouth daily. 60 tablet 2  . glucose monitoring kit (FREESTYLE) monitoring kit 1 each by Does not apply route as needed for other. 1 each 0  . hydrALAZINE (APRESOLINE) 50 MG tablet Take 1 tablet (50 mg total) by mouth 3 (three) times daily. (Patient taking differently: Take 100 mg by mouth 2 (two) times daily. ) 90 tablet 5  . levETIRAcetam (KEPPRA) 1000 MG tablet Take 1 tablet (1,000 mg total) by mouth 2 (two) times daily. 60 tablet 11  . lidocaine (XYLOCAINE) 2 % solution Use as directed 15 mLs in the mouth or throat as needed for mouth pain. 100 mL 0  . losartan (COZAAR) 100 MG tablet Take 1 tablet (100 mg total) by mouth daily. 30 tablet 5  . metFORMIN (GLUCOPHAGE) 500 MG tablet Take 1 tablet (500 mg total) by mouth 2 (two) times daily with a meal. 180 tablet 3  . metoprolol tartrate (LOPRESSOR) 50 MG tablet Take 1 tablet (50 mg  total) by mouth 2 (two) times daily. 60 tablet 5  . potassium chloride SA (KLOR-CON M15) 15 MEQ tablet Take 2 tablets (30 mEq total) by mouth daily. 60 tablet 0  . Potassium Citrate 15 MEQ (1620 MG) TBCR      No current facility-administered medications on file prior to visit.     BP (!) 165/80   Pulse  82   Temp 98.4 F (36.9 C) (Oral)   Resp 16   Ht 5' 1"  (1.549 m)   Wt 197 lb (89.4 kg)   SpO2 100%   BMI 37.22 kg/m       Objective:   Physical Exam  General Mental Status- Alert. General Appearance- Not in acute distress.   Skin General: Color- Normal Color. Moisture- Normal Moisture.  Neck Carotid Arteries- Normal color. Moisture- Normal Moisture. No carotid bruits. No JVD.  Chest and Lung Exam Auscultation: Breath Sounds:-Normal.  Cardiovascular Auscultation:Rythm- Regular. Murmurs & Other Heart Sounds:Auscultation of the heart reveals- No Murmurs.  Abdomen Inspection:-Inspeection Normal. Palpation/Percussion:Note:No mass. Palpation and Percussion of the abdomen reveal- Non Tender, Non Distended + BS, no rebound or guarding.   Neurologic Cranial Nerve exam:- CN III-XII intact(No nystagmus), symmetric smile. Strength:- 5/5 equal and symmetric strength both upper and lower extremities.      Assessment & Plan:  For your recent fatigue, I want to do lab work-up to include listed labs on sheet.  Among these will include CBC to check for anemia and iron level.  For your hypertension, I want you to add 1 extra tablet of hydralazine a day.  Continue other blood pressure medications the same.  Future fasting lipid panel and A1c placed today.  We will need to get those on Monday.  Check your blood pressure daily and see if afternoon levels are improved.  For your reported low blood sugars in the morning occasionally, I recommend that you cut back your Glucotrol to just 1 tablet a day.  If any further low blood sugar events then will discontinue it completely.  Also  remember the type of foods and beverages that will increase sugar up quickly.  Follow-up in 3 weeks or as needed.  Mackie Pai, PA-C

## 2017-11-24 LAB — COMPREHENSIVE METABOLIC PANEL
ALBUMIN: 3.7 g/dL (ref 3.5–5.2)
ALT: 9 U/L (ref 0–35)
AST: 10 U/L (ref 0–37)
Alkaline Phosphatase: 41 U/L (ref 39–117)
BILIRUBIN TOTAL: 0.4 mg/dL (ref 0.2–1.2)
BUN: 21 mg/dL (ref 6–23)
CALCIUM: 9.5 mg/dL (ref 8.4–10.5)
CHLORIDE: 99 meq/L (ref 96–112)
CO2: 30 meq/L (ref 19–32)
CREATININE: 1.03 mg/dL (ref 0.40–1.20)
GFR: 73.9 mL/min (ref >60.00)
Glucose, Bld: 90 mg/dL (ref 70–99)
Potassium: 2.9 mEq/L — ABNORMAL LOW (ref 3.5–5.1)
SODIUM: 138 meq/L (ref 135–145)
Total Protein: 8 g/dL (ref 6.0–8.3)

## 2017-11-24 LAB — IBC PANEL
Iron: 30 ug/dL — ABNORMAL LOW (ref 42–145)
Saturation Ratios: 7.5 % — ABNORMAL LOW (ref 20.0–50.0)
Transferrin: 286 mg/dL (ref 212.0–360.0)

## 2017-11-24 LAB — CBC WITH DIFFERENTIAL/PLATELET
BASOS ABS: 0.1 10*3/uL (ref 0.0–0.1)
Basophils Relative: 0.9 % (ref 0.0–3.0)
EOS ABS: 0.1 10*3/uL (ref 0.0–0.7)
Eosinophils Relative: 2 % (ref 0.0–5.0)
HEMATOCRIT: 30 % — AB (ref 36.0–46.0)
HEMOGLOBIN: 9.5 g/dL — AB (ref 12.0–15.0)
LYMPHS PCT: 25.6 % (ref 12.0–46.0)
Lymphs Abs: 1.8 10*3/uL (ref 0.7–4.0)
MCHC: 31.7 g/dL (ref 30.0–36.0)
MCV: 74.7 fl — ABNORMAL LOW (ref 78.0–100.0)
MONO ABS: 0.4 10*3/uL (ref 0.1–1.0)
Monocytes Relative: 6.2 % (ref 3.0–12.0)
Neutro Abs: 4.5 10*3/uL (ref 1.4–7.7)
Neutrophils Relative %: 65.3 % (ref 43.0–77.0)
PLATELETS: 337 10*3/uL (ref 150.0–400.0)
RBC: 4.02 Mil/uL (ref 3.87–5.11)
RDW: 20 % — ABNORMAL HIGH (ref 11.5–15.5)
WBC: 6.9 10*3/uL (ref 4.0–10.5)

## 2017-11-24 LAB — FERRITIN: Ferritin: 5.8 ng/mL — ABNORMAL LOW (ref 10.0–291.0)

## 2017-11-24 LAB — VITAMIN B12: Vitamin B-12: 340 pg/mL (ref 211–911)

## 2017-11-24 LAB — TSH: TSH: 0.77 u[IU]/mL (ref 0.35–4.50)

## 2017-11-24 LAB — VITAMIN D 25 HYDROXY (VIT D DEFICIENCY, FRACTURES): VITD: 18.97 ng/mL — ABNORMAL LOW (ref 30.00–100.00)

## 2017-11-25 ENCOUNTER — Telehealth: Payer: Self-pay | Admitting: Medical

## 2017-11-25 MED ORDER — VITAMIN D (ERGOCALCIFEROL) 1.25 MG (50000 UNIT) PO CAPS: 50000.0000 [IU] | ORAL_CAPSULE | ORAL | 0 refills | Status: AC

## 2017-11-25 NOTE — Telephone Encounter (Signed)
Prescription of vitamin D sent to patient's pharmacy 

## 2017-11-27 ENCOUNTER — Other Ambulatory Visit: Payer: 59

## 2017-11-29 ENCOUNTER — Telehealth: Payer: Self-pay | Admitting: Medical

## 2017-11-29 LAB — VITAMIN B1: VITAMIN B1 (THIAMINE): 18 nmol/L (ref 8–30)

## 2017-11-29 MED ORDER — POTASSIUM CHLORIDE ER 10 MEQ PO TBCR: 10.0000 meq | EXTENDED_RELEASE_TABLET | Freq: Three times a day (TID) | ORAL | 0 refills | Status: AC

## 2017-11-29 NOTE — Telephone Encounter (Signed)
Prescription of K-Dur sent to patient's pharmacy.

## 2017-11-30 ENCOUNTER — Other Ambulatory Visit (INDEPENDENT_AMBULATORY_CARE_PROVIDER_SITE_OTHER): Payer: 59

## 2017-11-30 DIAGNOSIS — E119 Type 2 diabetes mellitus without complications: Secondary | ICD-10-CM

## 2017-11-30 DIAGNOSIS — E785 Hyperlipidemia, unspecified: Secondary | ICD-10-CM

## 2017-11-30 LAB — LIPID PANEL
CHOL/HDL RATIO: 5
Cholesterol: 178 mg/dL (ref 0–200)
HDL: 38.3 mg/dL — ABNORMAL LOW (ref >39.00)
LDL CALC: 115 mg/dL — AB (ref 0–99)
NonHDL: 139.59
TRIGLYCERIDES: 125 mg/dL (ref 0.0–149.0)
VLDL: 25 mg/dL (ref 0.0–40.0)

## 2017-11-30 LAB — HEMOGLOBIN A1C: Hgb A1c MFr Bld: 6.7 % — ABNORMAL HIGH (ref 4.6–6.5)

## 2017-12-06 ENCOUNTER — Telehealth: Payer: Self-pay

## 2017-12-06 NOTE — Telephone Encounter (Signed)
Completed Certificate of Death after reviewing chart; called pharmacy to check if patient had p/u last Rx for potassium discussed with pt on 11/29/17, she was to RTO for repeat lab in 2 weeks but expressed not wanting to take the pills in conversation regarding hypokalemia and was informed by provider that this could lead to cardiac arrhythmias and/or other heart issues. Pharmacy states that they have the prescription sent in on 11/29/17; it was never p/u by patient. Gave provider information and he approved Sudden Cardiac Death/Heart Failure as cause of death. Called [facility] to ask for approx time of death; while reviewing patient information, it was found that the funeral home had patient's DOB as Dec 20, 1970. I checked copy of patient's Driver's License for verification and informed funeral home that correct DOB is 11-09-1970; instructed me Ok to change on COD, may be returned from registrar. [place] informed COD ready for p/u, placed at front desk/SLS 05/22

## 2017-12-06 NOTE — Telephone Encounter (Signed)
Copied from CRM [id number]. Topic: Inquiry >> Dec 06, 2017  9:50 AM Crist Infante wrote: Reason for CRM: [name] from [facility] calling to ask where to bring pt's death certificate. She states pt passed away 2017/12/17 at the home. EMS was called.  Sherie Don is going to bring the death certificate by the office sometime today.

## 2017-12-16 DEATH — deceased

## 2018-01-03 NOTE — Progress Notes (Deleted)
HPI: Follow-up coronary artery disease.  Patient admitted February 2019 with non-ST elevation myocardial infarction.  Ejection fraction by echocardiogram was 35 to 40% with moderate diastolic dysfunction.  Cardiac catheterization revealed severe three-vessel coronary artery disease and moderately reduced LV function.  Preoperative carotid Doppler showed 1 to 39% bilateral stenosis.  ABIs showed moderate reduction on the left.  Patient subsequently underwent coronary artery bypass and graft with LIMA to the LAD, saphenous vein graft to the first diagonal, saphenous vein graft to the first obtuse marginal and sequential saphenous vein graft to the PDA and posterior lateral.  Also with significant hypertension.  Since last seen  Current Outpatient Medications  Medication Sig Dispense Refill  . amLODipine (NORVASC) 10 MG tablet Take 10 mg by mouth daily.    Marland Kitchen aspirin 325 MG tablet Take 1 tablet (325 mg total) by mouth daily.    Marland Kitchen atorvastatin (LIPITOR) 80 MG tablet Take 1 tablet (80 mg total) by mouth daily at 6 PM. 30 tablet 1  . blood glucose meter kit and supplies KIT Dispense based on patient and insurance preference. Use up to four times daily as directed. (FOR ICD-9 250.00, 250.01). 1 each 0  . calcium carbonate (TUMS EX) 750 MG chewable tablet Chew 2 tablets by mouth as needed for heartburn.    . chlorthalidone (HYGROTON) 25 MG tablet Take 1 tablet (25 mg total) by mouth daily. 30 tablet 5  . ferrous sulfate 325 (65 FE) MG tablet Take 1 tablet (325 mg total) by mouth daily with breakfast. 30 tablet 1  . glipiZIDE (GLUCOTROL XL) 10 MG 24 hr tablet Take 2 tablets (20 mg total) by mouth daily. 60 tablet 2  . glucose monitoring kit (FREESTYLE) monitoring kit 1 each by Does not apply route as needed for other. 1 each 0  . hydrALAZINE (APRESOLINE) 50 MG tablet Take 1 tablet (50 mg total) by mouth 3 (three) times daily. (Patient taking differently: Take 100 mg by mouth 2 (two) times daily. ) 90  tablet 5  . levETIRAcetam (KEPPRA) 1000 MG tablet Take 1 tablet (1,000 mg total) by mouth 2 (two) times daily. 60 tablet 11  . lidocaine (XYLOCAINE) 2 % solution Use as directed 15 mLs in the mouth or throat as needed for mouth pain. 100 mL 0  . losartan (COZAAR) 100 MG tablet Take 1 tablet (100 mg total) by mouth daily. 30 tablet 5  . metFORMIN (GLUCOPHAGE) 500 MG tablet Take 1 tablet (500 mg total) by mouth 2 (two) times daily with a meal. 180 tablet 3  . metoprolol tartrate (LOPRESSOR) 50 MG tablet Take 1 tablet (50 mg total) by mouth 2 (two) times daily. 60 tablet 5  . potassium chloride (K-DUR) 10 MEQ tablet Take 1 tablet (10 mEq total) by mouth 3 (three) times daily. 90 tablet 0  . Potassium Citrate 15 MEQ (1620 MG) TBCR     . Vitamin D, Ergocalciferol, (DRISDOL) 50000 units CAPS capsule Take 1 capsule (50,000 Units total) by mouth every 7 (seven) days. 8 capsule 0   No current facility-administered medications for this visit.      Past Medical History:  Diagnosis Date  . Anemia   . CVA (cerebral infarction)    right side numbness  . Diabetes mellitus   . Hypertension   . Seizures ([place])   . Stroke [facility])     Past Surgical History:  Procedure Laterality Date  . btl    . CESAREAN SECTION    .  CORONARY ARTERY BYPASS GRAFT N/A 08/30/2017   Procedure: CORONARY ARTERY BYPASS GRAFTING (CABG) x 5 WITH ENDOSCOPIC HARVESTING OF RIGHT SAPHENOUS VEIN;  Surgeon: Melrose Nakayama, MD;  Location: [facility];  Service: Open Heart Surgery;  Laterality: N/A;  . KNEE ARTHROSCOPY    . LEFT HEART CATH AND CORONARY ANGIOGRAPHY N/A 08/28/2017   Procedure: LEFT HEART CATH AND CORONARY ANGIOGRAPHY;  Surgeon: Nelva Bush, MD;  Location: [facility];  Service: Cardiovascular;  Laterality: N/A;  . TEE WITHOUT CARDIOVERSION N/A 08/30/2017   Procedure: TRANSESOPHAGEAL ECHOCARDIOGRAM (TEE);  Surgeon: Melrose Nakayama, MD;  Location: [facility];  Service: Open Heart Surgery;  Laterality: N/A;  .  TUBAL LIGATION      Social History   Socioeconomic History  . Marital status: Married    Spouse name: Montine Circle  . Number of children: Not on file  . Years of education: 75  . Highest education level: Not on file  Occupational History  . Occupation: [employer]  . Financial resource strain: Not on file  . Food insecurity:    Worry: Not on file    Inability: Not on file  . Transportation needs:    Medical: Not on file    Non-medical: Not on file  Tobacco Use  . Smoking status: Former Smoker    Packs/day: 0.30    Years: 16.00    Pack years: 4.80    Last attempt to quit: 08/27/2017    Years since quitting: 0.3  . Smokeless tobacco: Never Used  Substance and Sexual Activity  . Alcohol use: Yes  . Drug use: No  . Sexual activity: Never  Lifestyle  . Physical activity:    Days per week: Not on file    Minutes per session: Not on file  . Stress: Not on file  Relationships  . Social connections:    Talks on phone: Not on file    Gets together: Not on file    Attends religious service: Not on file    Active member of club or organization: Not on file    Attends meetings of clubs or organizations: Not on file    Relationship status: Not on file  . Intimate partner violence:    Fear of current or ex partner: Not on file    Emotionally abused: Not on file    Physically abused: Not on file    Forced sexual activity: Not on file  Other Topics Concern  . Not on file  Social History Narrative   Lives with husband and kids   Right-handed   Caffeine use: large cup of coffee Mon through Fri    Family History  Problem Relation Age of Onset  . Cancer Father   . Lupus Mother   . Diabetes Mother   . Rheum arthritis Mother   . Heart disease Mother   . Stroke Mother   . Kidney disease Maternal Grandmother   . Heart attack Maternal Grandfather   . Seizures Neg Hx     ROS: no fevers or chills, productive cough, hemoptysis, dysphasia, odynophagia,  melena, hematochezia, dysuria, hematuria, rash, seizure activity, orthopnea, PND, pedal edema, claudication. Remaining systems are negative.  Physical Exam: Well-developed well-nourished in no acute distress.  Skin is warm and dry.  HEENT is normal.  Neck is supple.  Chest is clear to auscultation with normal expansion.  Cardiovascular exam is regular rate and rhythm.  Abdominal exam nontender or distended. No masses palpated. Extremities show no  edema. neuro grossly intact  ECG- personally reviewed  A/P  1 coronary artery disease status post coronary artery bypass graft-patient doing well with no chest pain.  Plan to continue aspirin, statin and beta-blocker.  2 ischemic cardiomyopathy-LV function was reduced prior to revascularization.  Continue beta-blocker and ARB.  Repeat echocardiogram to reassess LV function.  3 chronic systolic congestive heart failure-patient appears to be euvolemic today.  Continue present dose of Lasix.  Check potassium and renal function.  Continue fluid restriction and low-sodium diet.  4 hypertension-  5 hyperlipidemia-continue statin.  LDL recently checked and was 115.  Add Zetia 10 mg daily.  Check lipids and liver in 4 weeks.  Kirk Ruths, MD

## 2018-01-04 ENCOUNTER — Ambulatory Visit: Payer: Self-pay | Admitting: Cardiology

## 2018-01-04 DIAGNOSIS — R0989 Other specified symptoms and signs involving the circulatory and respiratory systems: Secondary | ICD-10-CM

## 2018-01-05 ENCOUNTER — Encounter: Payer: Self-pay | Admitting: *Deleted

## 2018-08-25 IMAGING — DX DG CHEST 1V PORT
1 series · 1 of 1 positions shown · non-contrast
Comparison: 08/27/2017

CLINICAL DATA: Post CABG

EXAM:
PORTABLE CHEST 1 VIEW

[chest ap]
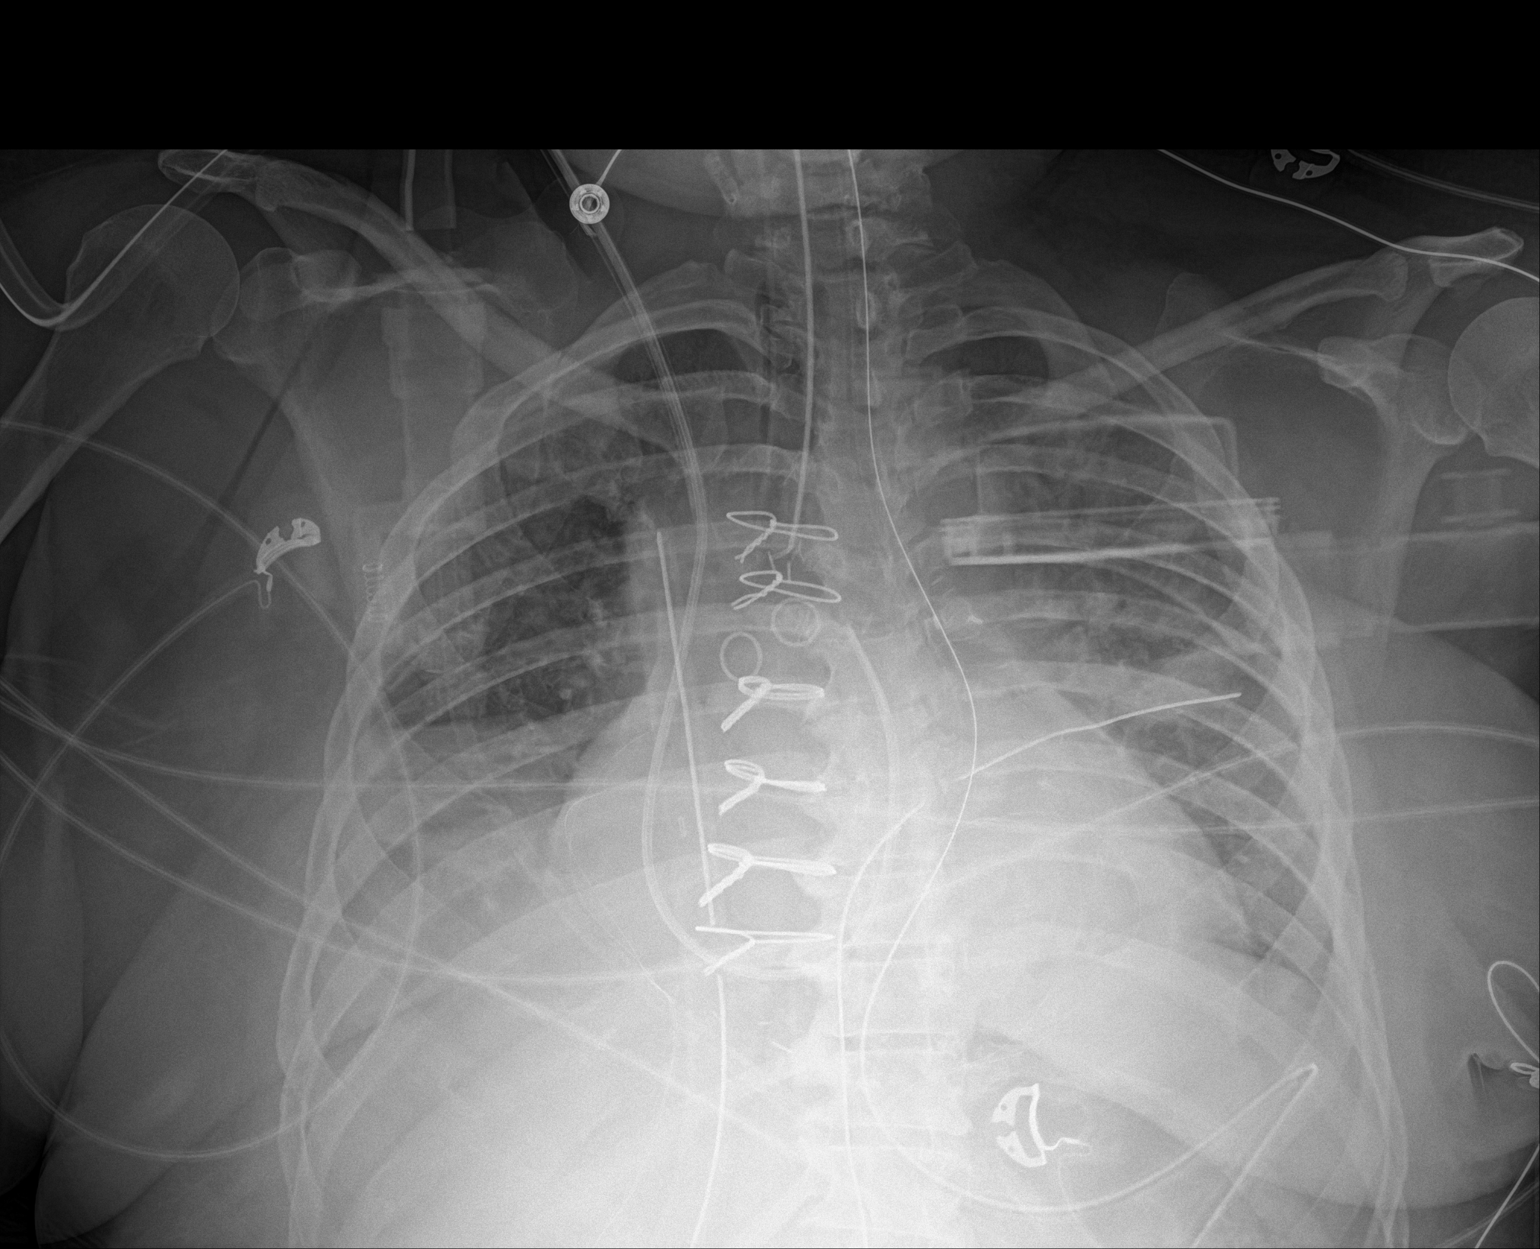

[1 of 1 positions shown; findings below may reference images not displayed]

FINDINGS: Changes of CABG. NG tube enters the stomach. Endotracheal tube
cm above the carina. Swan-Ganz catheter in the main pulmonary
artery. Bilateral chest tubes. No pneumothorax. Cardiomegaly with
vascular congestion. Right lower lobe opacity may reflect
atelectasis and effusion.
IMPRESSION: CABG changes.  Support devices as above.  No pneumothorax.

Right lower lobe atelectasis and/or effusion.

## 2018-09-28 IMAGING — DX DG CHEST 2V
2 series · 2 of 2 positions shown · non-contrast
Comparison: Chest radiograph 09/11/2017

CLINICAL DATA: Status post CABG

EXAM:
CHEST - 2 VIEW

[dg chest 2 view (1 of 2)]
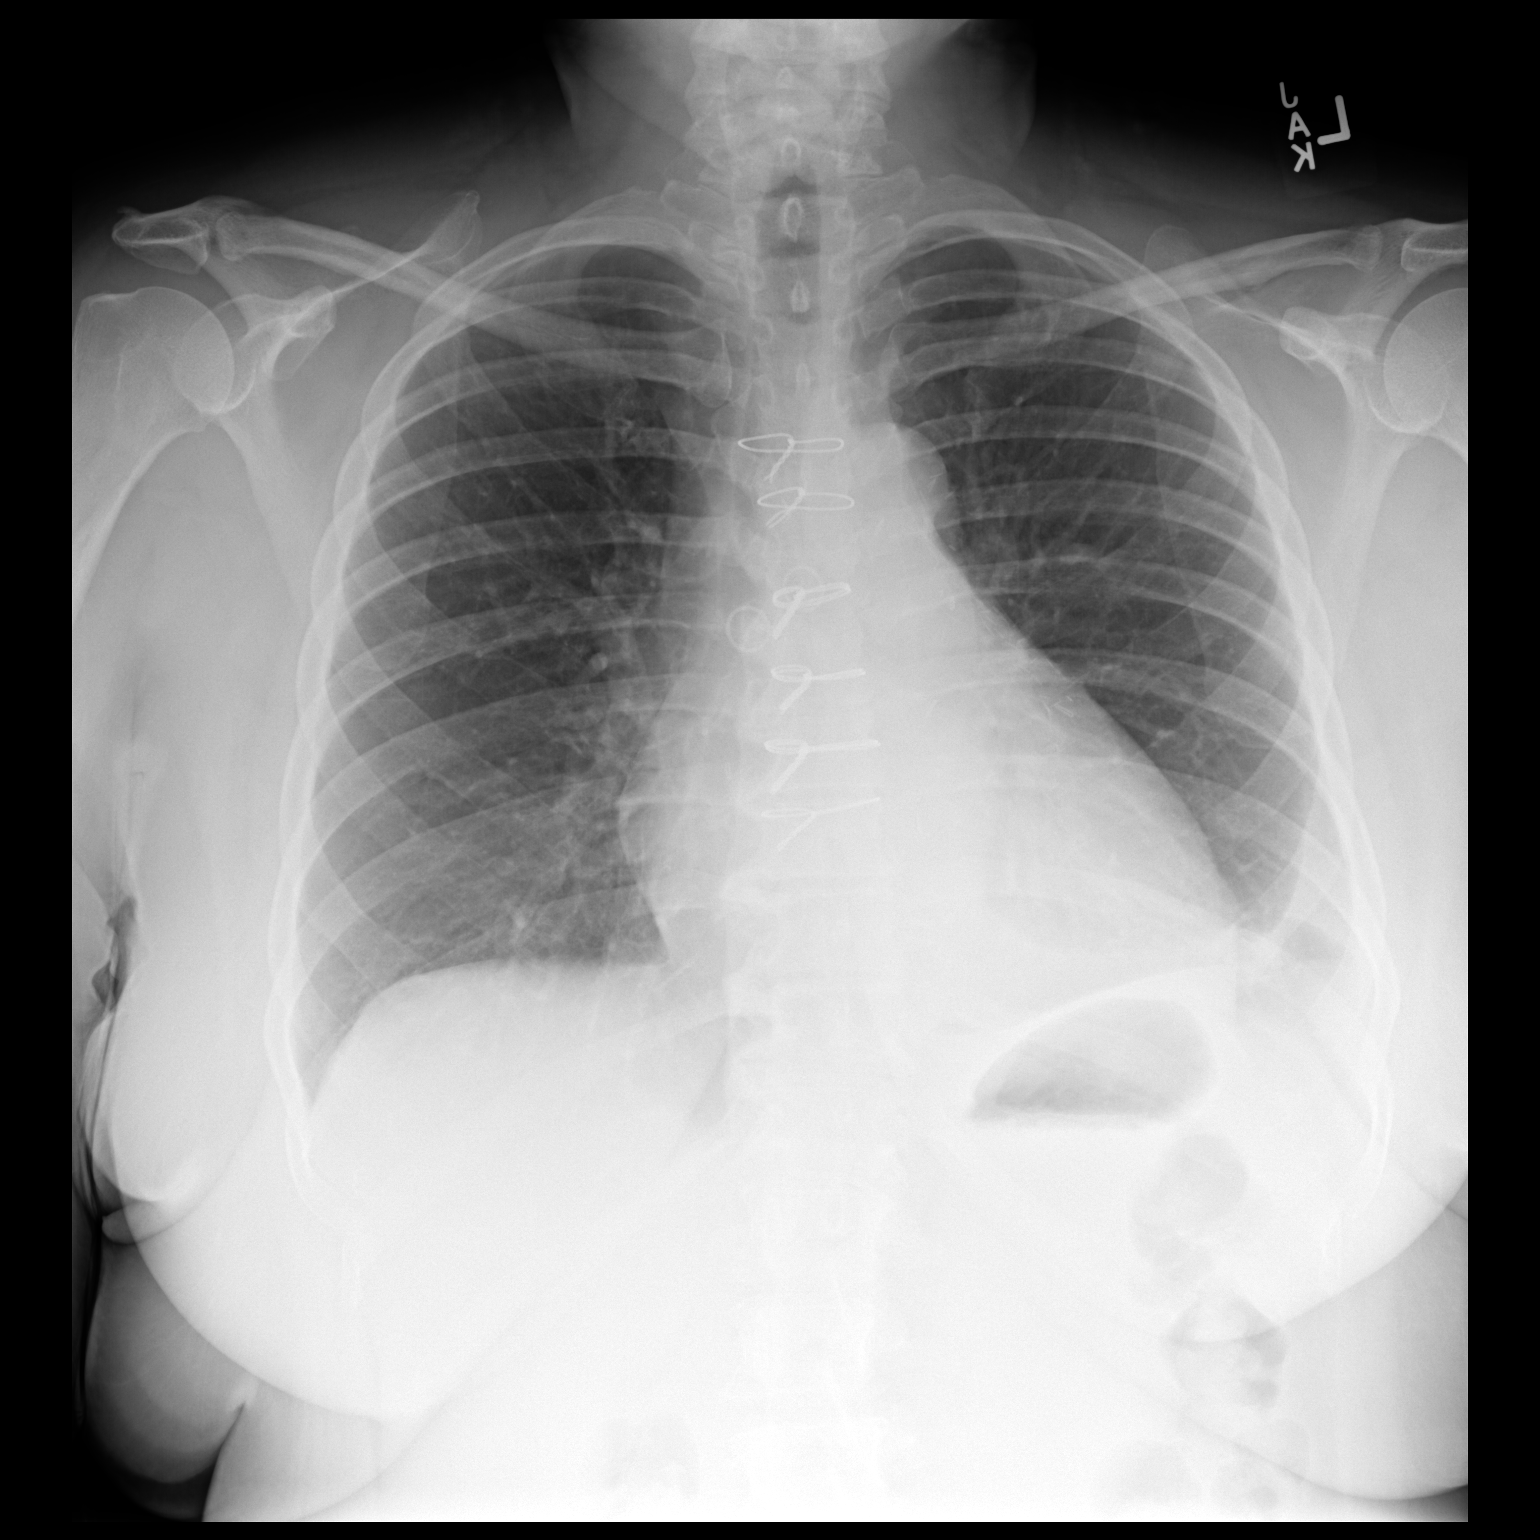

[dg chest 2 view (2 of 2)]
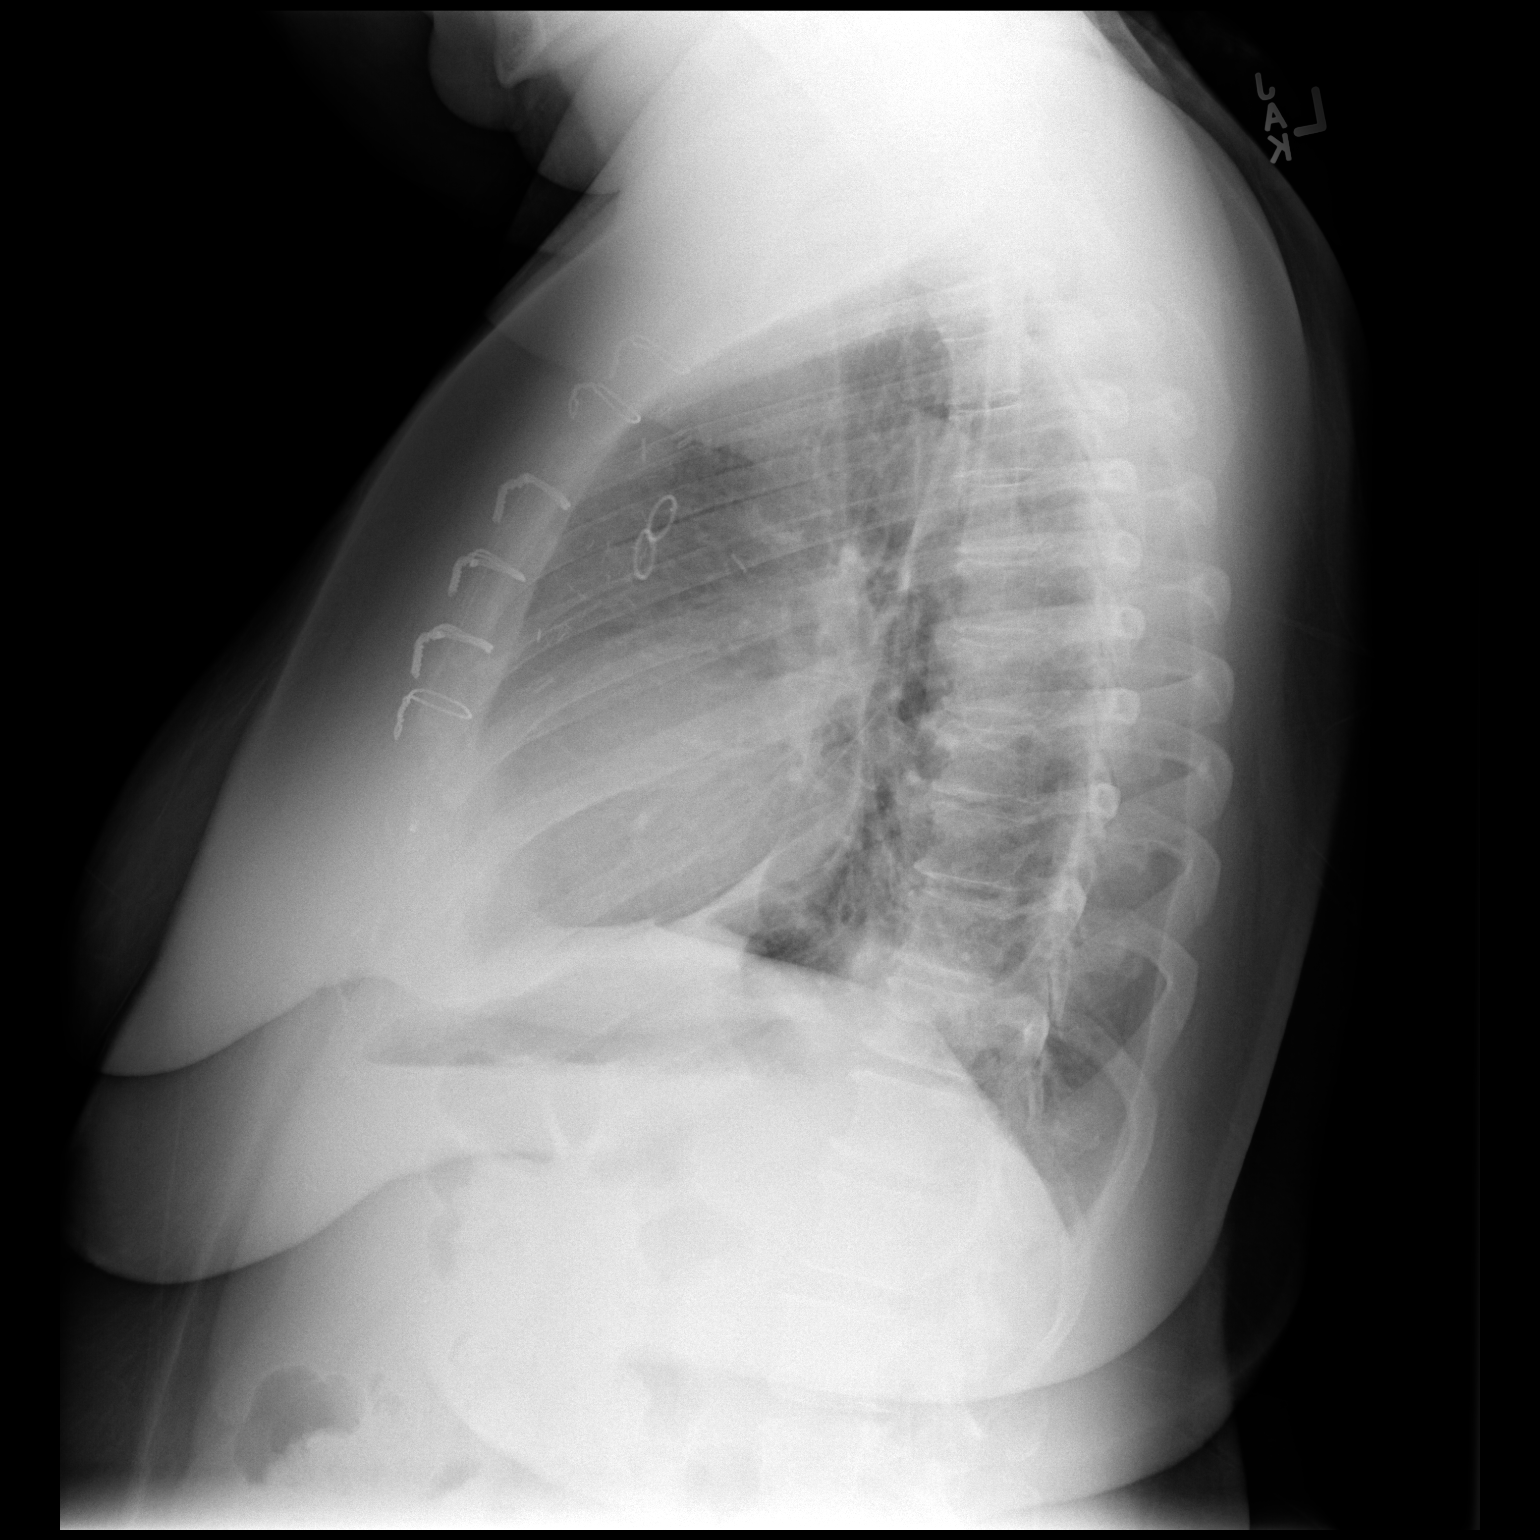

[2 of 2 positions shown; findings below may reference images not displayed]

FINDINGS: Improved aeration of the left lung with minimal residual left
basilar opacity and small amount of fluid in the left major fissure.
Sequelae of median sternotomy for CABG. Normal heart size. No
pneumothorax.
IMPRESSION: Improved aeration of the left lung with mild residual left basilar
opacity.
